# Patient Record
Sex: Female | Born: 1950 | Race: Black or African American | Hispanic: No | Smoking: Former smoker
Health system: Southern US, Community
[De-identification: ages and names within clinical notes are randomized; demographics above are authoritative.]

## PROBLEM LIST (undated history)

## (undated) DIAGNOSIS — I10 Essential (primary) hypertension: Secondary | ICD-10-CM

## (undated) DIAGNOSIS — J45909 Unspecified asthma, uncomplicated: Secondary | ICD-10-CM

## (undated) DIAGNOSIS — R079 Chest pain, unspecified: Secondary | ICD-10-CM

## (undated) DIAGNOSIS — E119 Type 2 diabetes mellitus without complications: Secondary | ICD-10-CM

## (undated) DIAGNOSIS — J449 Chronic obstructive pulmonary disease, unspecified: Secondary | ICD-10-CM

## (undated) DIAGNOSIS — I509 Heart failure, unspecified: Secondary | ICD-10-CM

## (undated) DIAGNOSIS — Z950 Presence of cardiac pacemaker: Secondary | ICD-10-CM

## (undated) DIAGNOSIS — I442 Atrioventricular block, complete: Secondary | ICD-10-CM

## (undated) HISTORY — DX: Chest pain, unspecified: R07.9

## (undated) HISTORY — DX: Heart failure, unspecified: I50.9

## (undated) HISTORY — DX: Chronic obstructive pulmonary disease, unspecified: J44.9

## (undated) HISTORY — DX: Type 2 diabetes mellitus without complications: E11.9

## (undated) HISTORY — DX: Presence of cardiac pacemaker: Z95.0

## (undated) HISTORY — PX: TUBAL LIGATION: SHX77

## (undated) HISTORY — PX: APPENDECTOMY: SHX54

---
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
## 2000-05-11 ENCOUNTER — Encounter: Payer: Self-pay | Admitting: *Deleted

## 2000-05-11 ENCOUNTER — Emergency Department (HOSPITAL_COMMUNITY): Admission: EM | Admit: 2000-05-11 | Discharge: 2000-05-11 | Payer: Self-pay | Admitting: Emergency Medicine

## 2001-07-08 ENCOUNTER — Emergency Department (HOSPITAL_COMMUNITY): Admission: EM | Admit: 2001-07-08 | Discharge: 2001-07-08 | Payer: Self-pay

## 2008-07-22 ENCOUNTER — Emergency Department (HOSPITAL_COMMUNITY): Admission: EM | Admit: 2008-07-22 | Discharge: 2008-07-22 | Payer: Self-pay | Admitting: Family Medicine

## 2010-04-28 ENCOUNTER — Emergency Department (HOSPITAL_COMMUNITY): Admission: EM | Admit: 2010-04-28 | Discharge: 2010-04-28 | Payer: Self-pay | Admitting: Emergency Medicine

## 2010-09-19 LAB — POCT I-STAT, CHEM 8
BUN: 12 mg/dL (ref 6–23)
Calcium, Ion: 1.07 mmol/L — ABNORMAL LOW (ref 1.12–1.32)
Chloride: 104 mEq/L (ref 96–112)
Creatinine, Ser: 0.9 mg/dL (ref 0.4–1.2)
Glucose, Bld: 106 mg/dL — ABNORMAL HIGH (ref 70–99)
HCT: 52 % — ABNORMAL HIGH (ref 36.0–46.0)
Hemoglobin: 17.7 g/dL — ABNORMAL HIGH (ref 12.0–15.0)
Potassium: 4.3 mEq/L (ref 3.5–5.1)
Sodium: 136 mEq/L (ref 135–145)
TCO2: 27 mmol/L (ref 0–100)

## 2011-03-18 ENCOUNTER — Emergency Department (HOSPITAL_COMMUNITY)
Admission: EM | Admit: 2011-03-18 | Discharge: 2011-03-18 | Disposition: A | Discharge status: Home / Self Care | Payer: Self-pay | Attending: Emergency Medicine | Admitting: Emergency Medicine

## 2011-03-18 ENCOUNTER — Emergency Department (HOSPITAL_COMMUNITY): Payer: Self-pay

## 2011-03-18 DIAGNOSIS — Y92009 Unspecified place in unspecified non-institutional (private) residence as the place of occurrence of the external cause: Secondary | ICD-10-CM | POA: Insufficient documentation

## 2011-03-18 DIAGNOSIS — S82409A Unspecified fracture of shaft of unspecified fibula, initial encounter for closed fracture: Secondary | ICD-10-CM | POA: Insufficient documentation

## 2011-03-18 DIAGNOSIS — I10 Essential (primary) hypertension: Secondary | ICD-10-CM | POA: Insufficient documentation

## 2011-03-18 DIAGNOSIS — W010XXA Fall on same level from slipping, tripping and stumbling without subsequent striking against object, initial encounter: Secondary | ICD-10-CM | POA: Insufficient documentation

## 2011-03-18 DIAGNOSIS — R609 Edema, unspecified: Secondary | ICD-10-CM | POA: Insufficient documentation

## 2012-06-23 ENCOUNTER — Encounter (HOSPITAL_COMMUNITY): Payer: Self-pay | Admitting: *Deleted

## 2012-06-23 ENCOUNTER — Emergency Department (HOSPITAL_COMMUNITY): Payer: Medicaid Other

## 2012-06-23 ENCOUNTER — Inpatient Hospital Stay (HOSPITAL_COMMUNITY)
Admission: EM | Admit: 2012-06-23 | Discharge: 2012-06-28 | DRG: 244 | Disposition: A | Discharge status: Home / Self Care | Payer: Medicaid Other | Attending: Cardiology | Admitting: Cardiology

## 2012-06-23 DIAGNOSIS — J449 Chronic obstructive pulmonary disease, unspecified: Secondary | ICD-10-CM | POA: Diagnosis present

## 2012-06-23 DIAGNOSIS — S92819A Other fracture of unspecified foot, initial encounter for closed fracture: Secondary | ICD-10-CM

## 2012-06-23 DIAGNOSIS — S92909A Unspecified fracture of unspecified foot, initial encounter for closed fracture: Secondary | ICD-10-CM

## 2012-06-23 DIAGNOSIS — I442 Atrioventricular block, complete: Principal | ICD-10-CM

## 2012-06-23 DIAGNOSIS — I160 Hypertensive urgency: Secondary | ICD-10-CM

## 2012-06-23 DIAGNOSIS — I272 Pulmonary hypertension, unspecified: Secondary | ICD-10-CM

## 2012-06-23 DIAGNOSIS — F17200 Nicotine dependence, unspecified, uncomplicated: Secondary | ICD-10-CM | POA: Diagnosis present

## 2012-06-23 DIAGNOSIS — J4489 Other specified chronic obstructive pulmonary disease: Secondary | ICD-10-CM | POA: Diagnosis present

## 2012-06-23 DIAGNOSIS — W010XXA Fall on same level from slipping, tripping and stumbling without subsequent striking against object, initial encounter: Secondary | ICD-10-CM | POA: Diagnosis present

## 2012-06-23 DIAGNOSIS — J45909 Unspecified asthma, uncomplicated: Secondary | ICD-10-CM

## 2012-06-23 DIAGNOSIS — Z91199 Patient's noncompliance with other medical treatment and regimen due to unspecified reason: Secondary | ICD-10-CM

## 2012-06-23 DIAGNOSIS — I2789 Other specified pulmonary heart diseases: Secondary | ICD-10-CM | POA: Diagnosis present

## 2012-06-23 DIAGNOSIS — S92919A Unspecified fracture of unspecified toe(s), initial encounter for closed fracture: Secondary | ICD-10-CM | POA: Diagnosis present

## 2012-06-23 DIAGNOSIS — I10 Essential (primary) hypertension: Secondary | ICD-10-CM

## 2012-06-23 DIAGNOSIS — Z9119 Patient's noncompliance with other medical treatment and regimen: Secondary | ICD-10-CM

## 2012-06-23 DIAGNOSIS — W108XXA Fall (on) (from) other stairs and steps, initial encounter: Secondary | ICD-10-CM

## 2012-06-23 HISTORY — DX: Unspecified asthma, uncomplicated: J45.909

## 2012-06-23 HISTORY — DX: Essential (primary) hypertension: I10

## 2012-06-23 HISTORY — DX: Atrioventricular block, complete: I44.2

## 2012-06-23 LAB — POCT I-STAT, CHEM 8
BUN: 11 mg/dL (ref 6–23)
Calcium, Ion: 1.22 mmol/L (ref 1.13–1.30)
Chloride: 104 mEq/L (ref 96–112)
Creatinine, Ser: 0.9 mg/dL (ref 0.50–1.10)
Glucose, Bld: 95 mg/dL (ref 70–99)
HCT: 51 % — ABNORMAL HIGH (ref 36.0–46.0)
Hemoglobin: 17.3 g/dL — ABNORMAL HIGH (ref 12.0–15.0)
Potassium: 4.3 mEq/L (ref 3.5–5.1)
Sodium: 142 mEq/L (ref 135–145)
TCO2: 28 mmol/L (ref 0–100)

## 2012-06-23 LAB — MAGNESIUM: Magnesium: 2 mg/dL (ref 1.5–2.5)

## 2012-06-23 LAB — POCT I-STAT TROPONIN I: Troponin i, poc: 0.03 ng/mL (ref 0.00–0.08)

## 2012-06-23 LAB — PROTIME-INR
INR: 1.04 (ref 0.00–1.49)
Prothrombin Time: 13.5 seconds (ref 11.6–15.2)

## 2012-06-23 LAB — TROPONIN I
Troponin I: <0.3 ng/mL (ref <0.30)
Troponin I: <0.3 ng/mL (ref <0.30)

## 2012-06-23 MED ORDER — CHLORHEXIDINE GLUCONATE 4 % EX LIQD
60.0000 mL | Freq: Once | CUTANEOUS | Status: AC
  Administered 2012-06-23: 4 via TOPICAL
  Filled 2012-06-23: qty 60

## 2012-06-23 MED ORDER — NITROGLYCERIN IN D5W 200-5 MCG/ML-% IV SOLN
5.0000 ug/min | INTRAVENOUS | Status: DC
  Administered 2012-06-23: 100 ug/min via INTRAVENOUS
  Administered 2012-06-23: 5 ug/min via INTRAVENOUS
  Administered 2012-06-24: 70 ug/min via INTRAVENOUS
  Administered 2012-06-24: 160 ug/min via INTRAVENOUS
  Administered 2012-06-24: 125 ug/min via INTRAVENOUS
  Administered 2012-06-26: 15 ug/min via INTRAVENOUS
  Filled 2012-06-23 (×6): qty 250

## 2012-06-23 MED ORDER — SODIUM CHLORIDE 0.45 % IV SOLN
INTRAVENOUS | Status: DC
  Administered 2012-06-23: 50 mL/h via INTRAVENOUS

## 2012-06-23 MED ORDER — CHLORHEXIDINE GLUCONATE 4 % EX LIQD
60.0000 mL | Freq: Once | CUTANEOUS | Status: AC
  Administered 2012-06-24: 4 via TOPICAL
  Filled 2012-06-23 (×2): qty 60

## 2012-06-23 MED ORDER — ACETAMINOPHEN 325 MG PO TABS: 650.0000 mg | ORAL_TABLET | ORAL | Status: DC | PRN

## 2012-06-23 MED ORDER — HYDROCODONE-ACETAMINOPHEN 5-325 MG PO TABS
1.0000 | ORAL_TABLET | ORAL | Status: DC | PRN
  Administered 2012-06-23 (×2): 1 via ORAL
  Administered 2012-06-24 – 2012-06-27 (×13): 2 via ORAL
  Administered 2012-06-27: 1 via ORAL
  Administered 2012-06-27 – 2012-06-28 (×2): 2 via ORAL
  Filled 2012-06-23 (×2): qty 2
  Filled 2012-06-23: qty 1
  Filled 2012-06-23 (×6): qty 2
  Filled 2012-06-23: qty 1
  Filled 2012-06-23 (×4): qty 2
  Filled 2012-06-23: qty 1
  Filled 2012-06-23 (×3): qty 2

## 2012-06-23 MED ORDER — SODIUM CHLORIDE 0.9 % IJ SOLN
3.0000 mL | Freq: Two times a day (BID) | INTRAMUSCULAR | Status: DC
  Administered 2012-06-23 – 2012-06-27 (×5): 3 mL via INTRAVENOUS

## 2012-06-23 MED ORDER — SODIUM CHLORIDE 0.9 % IJ SOLN
3.0000 mL | Freq: Two times a day (BID) | INTRAMUSCULAR | Status: DC
  Administered 2012-06-23 – 2012-06-24 (×2): 3 mL via INTRAVENOUS

## 2012-06-23 MED ORDER — LISINOPRIL 20 MG PO TABS
20.0000 mg | ORAL_TABLET | Freq: Every day | ORAL | Status: DC
  Administered 2012-06-23 – 2012-06-24 (×2): 20 mg via ORAL
  Filled 2012-06-23 (×3): qty 1

## 2012-06-23 MED ORDER — SODIUM CHLORIDE 0.9 % IR SOLN
80.0000 mg | Status: DC
  Filled 2012-06-23: qty 2

## 2012-06-23 MED ORDER — SODIUM CHLORIDE 0.9 % IJ SOLN: 3.0000 mL | INTRAMUSCULAR | Status: DC | PRN

## 2012-06-23 MED ORDER — SODIUM CHLORIDE 0.9 % IV SOLN
250.0000 mL | INTRAVENOUS | Status: DC | PRN
  Administered 2012-06-24: 250 mL via INTRAVENOUS

## 2012-06-23 MED ORDER — SODIUM CHLORIDE 0.9 % IV SOLN: 250.0000 mL | INTRAVENOUS | Status: DC

## 2012-06-23 MED ORDER — NITROGLYCERIN 0.4 MG SL SUBL: 0.4000 mg | SUBLINGUAL_TABLET | SUBLINGUAL | Status: DC | PRN

## 2012-06-23 MED ORDER — CEFAZOLIN SODIUM-DEXTROSE 2-3 GM-% IV SOLR
2.0000 g | INTRAVENOUS | Status: DC
  Filled 2012-06-23: qty 50

## 2012-06-23 MED ORDER — ONDANSETRON HCL 4 MG/2ML IJ SOLN: 4.0000 mg | Freq: Four times a day (QID) | INTRAMUSCULAR | Status: DC | PRN

## 2012-06-23 MED ORDER — ALBUTEROL SULFATE HFA 108 (90 BASE) MCG/ACT IN AERS
2.0000 | INHALATION_SPRAY | Freq: Four times a day (QID) | RESPIRATORY_TRACT | Status: DC | PRN
  Filled 2012-06-23: qty 6.7

## 2012-06-23 NOTE — H&P (Signed)
History and Physical  Patient ID: Erika Leach MRN: [id number], DOB: 29-Nov-1950 Date of Encounter: 06/23/2012, 3:43 PM Primary Physician: does not see a PCP Primary Cardiologist: New to [place]  Chief Complaint: foot pain Reason for Consult: complete heart block  HPI: Erika Leach is a 61 y/o F with h/o HTN, asthma, 40 yrs tobacco & no prior cardiac history who has not seen a doctor in several years. She presented to [facility] complaining of foot pain from a mechanical fall a few days back and was found to be in complete heart block and hypertensive. On Friday 12/13, she was walking up the steps with groceries in hand and lost her footing. She fell on her R side, striking her shoulder. This has ached over the weekend and she has been unable to raise up her R arm over her head. She also believes she injured her foot as she woke up with R foot pain near the ball of her foot. No swelling or deformity but it is tender to the touch. On arrival to the ER, she was found to have a HR 35-50 in complete heart block and BP as high as 242/87. In retrospect, she does report that she has had worsening DOE for several months now. She reports easy fatiguing with even minimal activity. She has had occasional "pings" of chest pain but no sustained episodes beyond a second or two. No exertional chest pain. No LEE, orthopnea, PND, nausea, vomiting, near-syncope or syncope. She continues to have foot/shoulder pain.  Past Medical History  Diagnosis Date  . Asthma   . HTN (hypertension)     Dx in past      Most Recent Cardiac Studies: None   Surgical History: History reviewed. No pertinent past surgical history.   Home Meds: occasional OTC cough syrup  Allergies: No Known Allergies  History   Social History  . Marital Status: Married    Spouse Name: N/A    Number of Children: N/A  . Years of Education: N/A   Occupational History  . Not on file.   Social History Main Topics  . Smoking status: Current Every  Day Smoker -- 40 years  . Smokeless tobacco: Never Used  . Alcohol Use: Yes     Comment: rare - special occasions  . Drug Use: No  . Sexually Active: Not on file   Other Topics Concern  . Not on file   Social History Narrative  . No narrative on file     Family History  Problem Relation Age of Onset  . Heart attack Mother     Died age 92  . Pneumonia Father   . Breast cancer Sister   . Heart attack Brother     3 MI's - 1st age 56    Review of Systems: General: negative for chills, fever, night sweats. Weight gain of 20 lbs steadily after 1 yr of eating poorly.  Cardiovascular: negative for chest pain, edema, orthopnea, palpitations, paroxysmal nocturnal dyspnea. For positives see above Dermatological: negative for rash Respiratory: chronic cough, occasionally wakes her up Urologic: negative for hematuria Abdominal: negative for nausea, vomiting, diarrhea, bright red blood per rectum, melena, or hematemesis Neurologic: negative for visual changes, syncope, or dizziness All other systems reviewed and are otherwise negative except as noted above.  Labs:   Lab Results  Component Value Date   HGB 17.3* 06/23/2012   HCT 51.0* 06/23/2012    Lab 06/23/12 1456  NA 142  K 4.3  CL 104  CO2 --  BUN 11  CREATININE 0.90  CALCIUM --  PROT --  BILITOT --  ALKPHOS --  ALT --  AST --  GLUCOSE 95    Radiology/Studies:  Dg Chest 2 View 06/23/2012  *RADIOLOGY REPORT*  Clinical Data: Twisted foot, heart block  CHEST - 2 VIEW  Comparison: 04/28/2010  Findings: Cardiomediastinal silhouette is stable.  No acute infiltrate or pleural effusion.  No pulmonary edema.  Bony thorax is unremarkable.  IMPRESSION: No active disease.  No significant change.   Original Report Authenticated By: Natasha Mead, M.D.    Dg Foot Complete Right  06/23/2012  *RADIOLOGY REPORT*  Clinical Data: Foot injury  RIGHT FOOT COMPLETE - 3+ VIEW  Comparison: None.  Findings: Three views of the right foot  submitted.  There is a lucent line in the medial sesamoid adjacent to distal aspect first metatarsal.  Sesamoid bone fracture cannot be excluded.  Clinical correlation is necessary.  IMPRESSION: There is a lucent line in the medial sesamoid adjacent to distal aspect first metatarsal.  Sesamoid bone fracture cannot be excluded.  Clinical correlation is necessary.   Original Report Authenticated By: Natasha Mead, M.D.    Shoulder film - we ordered. Pending.   EKG: complete heart block 39bpm with RBBB, LPFB morphology, slight ST depression III, avF, no prior to compare to  Physical Exam: Blood pressure 221/90, pulse 46, temperature 98.1 F (36.7 C), temperature source Oral, resp. rate 21, SpO2 99.00%. General: Well developed, well nourished AAF in no acute distress. Head: Normocephalic, atraumatic, sclera somewhat icteric, no xanthomas, nares are without discharge. Neck: Negative for carotid bruits. JVD not elevated. Mild thyromegaly. Lungs: Diminished BS throughout with diffuse expiratory wheezing, coarse breath sounds. Breathing is unlabored. Heart: regular rhythm but markedly bradycardic with S1 S2. No murmurs, rubs, or gallops appreciated. Abdomen: Soft, non-tender, non-distended with normoactive bowel sounds. No hepatomegaly. No rebound/guarding. No obvious abdominal masses. Msk:  Strength and tone appear normal for age. Extremities: No clubbing or cyanosis. No edema.  Distal pedal pulses are 2+ and equal bilaterally. R great toe area is tender to palpation. Guarding of R shoulder noted. Neuro: Alert and oriented X 3. No focal deficit. No facial asymmetry. Moves all extremities spontaneously. Psych:  Responds to questions appropriately with a normal affect.    ASSESSMENT AND PLAN:   1. Complete heart block - Uncertain chronicity but she reports months of DOE and easy fatiguing. Not on any AV nodal blocking agents. Check TFTs. She is currently hemodynamically stable. Plan transfer to [facility]  ICU with pacemaker planned in AM. Dr. Antoine Poche discussed with Dr. Graciela Husbands - at this time, will hold off on cath as we do not feel this is an ischemic rhythm. Check echo. 2. Tobacco abuse - counseled regarding cessation. Will rx albuterol inhaler for wheezing.  3. R foot and R shoulder pain s/p mechanical fall - pain control. Ortho consult regarding abnormal film ?fracture and further management. 4. Hypertensive urgency - suspect chronic HTN with component of pain accelerating further. Pain management. Initiate careful BP lowering - will add IV NTG to titrate and start ACEI.  Signed, Dayna Dunn PA-C 06/23/2012, 3:43 PM  History and all data above reviewed.  Patient examined.  I agree with the findings as above. The patient presents after a fall area she had trauma to her right shoulder and foot as described above. She did not have a syncopal episode. She has had no prior cardiac history. She does have a long-standing smoking history  he doesn't see doctors. Looks like she has had uncontrolled hypertension. She has had some slowly progressive dyspnea over about 8 months. Will she describes shortness of breath walking up the stairs. She's not been PND or orthopnea. She does not have any chest pain. Upon presentation to the emergency room and to followup her foot pain she was noted to be in complete heart block. The patient exam reveals [id number]  ,  Lungs: Decrea breath sounds, no wheezing or crackles  ,  Abd: Positive bowel sounds, no rebound no guarding, Ext No edema  .  All available labs, radiology testing, previous records reviewed. Agree with documented assessment and plan. CHB: We will cycle cardiac enzymes. The patient will need a pacemaker after we have excluded any acute coronary event.  She will have an echocardiogram.  Tobacco:  She has been educated.   Fayrene Fearing Mannie Wineland  5:03 PM  06/23/2012

## 2012-06-23 NOTE — Consult Note (Signed)
No primary provider on file. Chief Complaint: Right foot and shoulder pain History: Erika Leach is a 61 y/o F with h/o HTN, asthma, 40 yrs tobacco & no prior cardiac history who has not seen a doctor in several years. She presented to [facility] complaining of foot pain from a mechanical fall a few days back and was found to be in complete heart block and hypertensive. On Friday 12/13, she was walking up the steps with groceries in hand and lost her footing. She fell on her R side, striking her shoulder. This has ached over the weekend and she has been unable to raise up her R arm over her head. She also believes she injured her foot as she woke up with R foot pain near the ball of her foot. No swelling or deformity but it is tender to the touch. On arrival to the ER, she was found to have a HR 35-50 in complete heart block and BP as high as 242/87. In retrospect, she does report that she has had worsening DOE for several months now. She reports easy fatiguing with even minimal activity. She has had occasional "pings" of chest pain but no sustained episodes beyond a second or two. No exertional chest pain. No LEE, orthopnea, PND, nausea, vomiting, near-syncope or syncope. She continues to have foot/shoulder pain.  Past Medical History  Diagnosis Date  . Asthma   . HTN (hypertension)     Dx in past     No Known Allergies  No current facility-administered medications on file prior to encounter.   No current outpatient prescriptions on file prior to encounter.    Physical Exam: Filed Vitals:   06/23/12 1530  BP: 225/85  Pulse: 37  Temp:   Resp: 21   A+O X3 NVI Compartments soft/NT Pain with palpation of right foot (sesamoid) No hip/knee/ankle pain   Image: Dg Chest 2 View  06/23/2012  *RADIOLOGY REPORT*  Clinical Data: Twisted foot, heart block  CHEST - 2 VIEW  Comparison: 04/28/2010  Findings: Cardiomediastinal silhouette is stable.  No acute infiltrate or pleural effusion.  No pulmonary  edema.  Bony thorax is unremarkable.  IMPRESSION: No active disease.  No significant change.   Original Report Authenticated By: Natasha Mead, M.D.    Dg Shoulder Right Port  06/23/2012  *RADIOLOGY REPORT*  Clinical Data: Status post fall.  PORTABLE RIGHT SHOULDER - 2+ VIEW  Comparison: None.  Findings: The humerus is located and the acromioclavicular joint is intact.  There is no fracture.  Mild to moderate acromioclavicular degenerative change is noted.  Imaged right lung and ribs are clear.  IMPRESSION: No acute finding.  Acromioclavicular degenerative disease.   Original Report Authenticated By: Holley Dexter, M.D.    Dg Foot Complete Right  06/23/2012  *RADIOLOGY REPORT*  Clinical Data: Foot injury  RIGHT FOOT COMPLETE - 3+ VIEW  Comparison: None.  Findings: Three views of the right foot submitted.  There is a lucent line in the medial sesamoid adjacent to distal aspect first metatarsal.  Sesamoid bone fracture cannot be excluded.  Clinical correlation is necessary.  IMPRESSION: There is a lucent line in the medial sesamoid adjacent to distal aspect first metatarsal.  Sesamoid bone fracture cannot be excluded.  Clinical correlation is necessary.   Original Report Authenticated By: Natasha Mead, M.D.     A/P:  Patient with right medial sesamoid fracture.  S/p fall with wt bearing pain.   No need for acute surgical intervention Agree with ER plan of  CAM walker and assisted ambulation Patient will require admission for pacemaker 2nd to heart block.   Recommend WBAT in CAM boot with platform walker since she will not be able to use crutches after pacemaker placement F/U in 1 week for re-evaluation. No acute shoulder injury - no evidence of fracture.  Treat symptomatically.

## 2012-06-23 NOTE — ED Notes (Signed)
Per PA pt allowed to have heart healthy diet. sandwhich given to pt

## 2012-06-23 NOTE — ED Notes (Signed)
Crash cart at bedside. Pads on pt

## 2012-06-23 NOTE — ED Notes (Signed)
Cardiology at bedside.

## 2012-06-23 NOTE — ED Notes (Signed)
Gave EKG to Dr Radford Pax

## 2012-06-23 NOTE — ED Notes (Signed)
Pt states she fell going up the stairs on Friday, states having R foot pain, states woke her up in the middle of the night, no deformity noted, states top of foot is where pain is, denies LOC or hitting head.

## 2012-06-23 NOTE — ED Provider Notes (Signed)
History     CSN: [id number]  Arrival date & time 06/23/12  1331   First MD Initiated Contact with Patient 06/23/12 1432      Chief Complaint  Patient presents with  . Foot Injury    right     HPI Pt states she fell going up the stairs on Friday, states having R foot pain, states woke her up in the middle of the night, no deformity noted, states top of foot is where pain is, denies LOC or hitting head.  Patient denies any chest pain but has had some increase shortness of breath.  She is a smoker.  She has no known heart history.  She's had no history of syncope. Past Medical History  Diagnosis Date  . Asthma   . HTN (hypertension)     poorly controlled  . Complete heart block 06/24/2012    Past Surgical History  Procedure Date  . Tubal ligation     Family History  Problem Relation Age of Onset  . Heart attack Mother     Died age 38  . Pneumonia Father   . Breast cancer Sister   . Heart attack Brother     3 MI's - 1st age 65    History  Substance Use Topics  . Smoking status: Current Every Day Smoker -- 1.0 packs/day for 40 years    Types: Cigarettes  . Smokeless tobacco: Never Used  . Alcohol Use: Yes     Comment: rare - special occasions    OB History    Grav Para Term Preterm Abortions TAB SAB Ect Mult Living                  Review of Systems All other systems reviewed and are negative Allergies  Review of patient's allergies indicates no known allergies.  Home Medications   No current outpatient prescriptions on file.  BP 160/84  Pulse 68  Temp 99 F (37.2 C) (Oral)  Resp 20  Ht 5\' 6"  (1.676 m)  Wt 189 lb 13.1 oz (86.1 kg)  BMI 30.64 kg/m2  SpO2 93%  Physical Exam  Nursing note and vitals reviewed. Constitutional: She is oriented to person, place, and time. She appears well-developed and well-nourished. No distress.  HENT:  Head: Normocephalic and atraumatic.  Eyes: Pupils are equal, round, and reactive to light.  Neck: Normal range  of motion.  Cardiovascular: Intact distal pulses.  Bradycardia present.   Pulmonary/Chest: No respiratory distress.  Abdominal: Normal appearance. She exhibits no distension.  Musculoskeletal: Normal range of motion.       Right foot: She exhibits tenderness (As indicated).       Feet:  Neurological: She is alert and oriented to person, place, and time. No cranial nerve deficit.  Skin: Skin is warm and dry. No rash noted.  Psychiatric: She has a normal mood and affect. Her behavior is normal.    ED Course  Procedures (including critical care time)  Date: 06/23/2012  Rate: 39  Rhythm: Third-degree heart block  QRS Axis: Right bundle branch block  Intervals: Abnormal  ST/T Wave abnormalities: ST depression in inferior leads  Conduction Disutrbances: none  Narrative Interpretation: Significant change from previous tracing in 2011   CRITICAL CARE Performed by: Nelva Nay L   Total critical care time: 30 min  Critical care time was exclusive of separately billable procedures and treating other patients.  Critical care was necessary to treat or prevent imminent or life-threatening deterioration.  Critical care was  time spent personally by me on the following activities: development of treatment plan with patient and/or surrogate as well as nursing, discussions with consultants, evaluation of patient's response to treatment, examination of patient, obtaining history from patient or surrogate, ordering and performing treatments and interventions, ordering and review of laboratory studies, ordering and review of radiographic studies, pulse oximetry and re-evaluation of patient's condition.   Labs Reviewed  POCT I-STAT, CHEM 8 - Abnormal; Notable for the following:    Hemoglobin 17.3 (*)     HCT 51.0 (*)     All other components within normal limits  BASIC METABOLIC PANEL - Abnormal; Notable for the following:    Glucose, Bld 145 (*)     GFR calc non Af Amer 75 (*)     GFR calc  Af Amer 86 (*)     All other components within normal limits  POCT I-STAT TROPONIN I  TROPONIN I  MAGNESIUM  TSH  T4, FREE  PROTIME-INR  MRSA PCR SCREENING  CBC  TROPONIN I  LIPID PANEL  LAB REPORT - SCANNED   Dg Chest Portable 1 View  06/25/2012  *RADIOLOGY REPORT*  Clinical Data: Pacemaker placement.  PORTABLE CHEST - 1 VIEW  Comparison: 06/23/2012 and 04/28/2010.  Findings: 0559 hours.  Interval placement of a left subclavian pacemaker with leads in the right atrium and right ventricle.  The heart size and mediastinal contours are stable.  The lungs are clear.  There is no pleural effusion or pneumothorax.  Telemetry leads overlie the chest.  IMPRESSION: Satisfactorily positioned pacemaker leads as described.  No demonstrated complication.   Original Report Authenticated By: Carey Bullocks, M.D.      1. Third degree heart block   2. Foot fracture   3. Complete heart block   4. HTN (hypertension)       MDM  Cardiology consulted and will come see the patient emergency room        Nelia Shi, MD 06/25/12 2242

## 2012-06-24 ENCOUNTER — Encounter (HOSPITAL_COMMUNITY)
Admission: EM | Disposition: A | Discharge status: Home / Self Care | Payer: Self-pay | Source: Home / Self Care | Attending: Cardiology

## 2012-06-24 ENCOUNTER — Encounter (HOSPITAL_COMMUNITY): Payer: Self-pay | Admitting: Internal Medicine

## 2012-06-24 DIAGNOSIS — I442 Atrioventricular block, complete: Secondary | ICD-10-CM

## 2012-06-24 HISTORY — PX: PERMANENT PACEMAKER INSERTION: SHX5480

## 2012-06-24 HISTORY — DX: Atrioventricular block, complete: I44.2

## 2012-06-24 LAB — BASIC METABOLIC PANEL
BUN: 15 mg/dL (ref 6–23)
CO2: 26 mEq/L (ref 19–32)
Calcium: 9.5 mg/dL (ref 8.4–10.5)
Chloride: 101 mEq/L (ref 96–112)
Creatinine, Ser: 0.83 mg/dL (ref 0.50–1.10)
GFR calc Af Amer: 86 mL/min — ABNORMAL LOW (ref >90)
GFR calc non Af Amer: 75 mL/min — ABNORMAL LOW (ref >90)
Glucose, Bld: 145 mg/dL — ABNORMAL HIGH (ref 70–99)
Potassium: 4 mEq/L (ref 3.5–5.1)
Sodium: 138 mEq/L (ref 135–145)

## 2012-06-24 LAB — CBC
HCT: 40.8 % (ref 36.0–46.0)
Hemoglobin: 13.6 g/dL (ref 12.0–15.0)
MCH: 28.7 pg (ref 26.0–34.0)
MCHC: 33.3 g/dL (ref 30.0–36.0)
MCV: 86.1 fL (ref 78.0–100.0)
Platelets: 209 10*3/uL (ref 150–400)
RBC: 4.74 MIL/uL (ref 3.87–5.11)
RDW: 13.3 % (ref 11.5–15.5)
WBC: 7.8 10*3/uL (ref 4.0–10.5)

## 2012-06-24 LAB — MRSA PCR SCREENING: MRSA by PCR: NEGATIVE

## 2012-06-24 LAB — T4, FREE: Free T4: 1.2 ng/dL (ref 0.80–1.80)

## 2012-06-24 LAB — LIPID PANEL
Cholesterol: 169 mg/dL (ref 0–200)
HDL: 43 mg/dL (ref >39)
LDL Cholesterol: 96 mg/dL (ref 0–99)
Total CHOL/HDL Ratio: 3.9 RATIO
Triglycerides: 149 mg/dL (ref <150)
VLDL: 30 mg/dL (ref 0–40)

## 2012-06-24 LAB — TSH: TSH: 1.593 u[IU]/mL (ref 0.350–4.500)

## 2012-06-24 SURGERY — PERMANENT PACEMAKER INSERTION
Anesthesia: LOCAL

## 2012-06-24 MED ORDER — LIDOCAINE HCL (PF) 1 % IJ SOLN
INTRAMUSCULAR | Status: AC
  Filled 2012-06-24: qty 60

## 2012-06-24 MED ORDER — MIDAZOLAM HCL 2 MG/2ML IJ SOLN
INTRAMUSCULAR | Status: AC
  Filled 2012-06-24: qty 2

## 2012-06-24 MED ORDER — SODIUM CHLORIDE 0.9 % IV SOLN
INTRAVENOUS | Status: DC
  Administered 2012-06-24: 21:00:00 via INTRAVENOUS

## 2012-06-24 MED ORDER — CEFAZOLIN SODIUM 1-5 GM-% IV SOLN
1.0000 g | Freq: Four times a day (QID) | INTRAVENOUS | Status: AC
  Administered 2012-06-24 – 2012-06-25 (×2): 1 g via INTRAVENOUS
  Filled 2012-06-24 (×2): qty 50

## 2012-06-24 MED ORDER — ONDANSETRON HCL 4 MG/2ML IJ SOLN: 4.0000 mg | Freq: Four times a day (QID) | INTRAMUSCULAR | Status: DC | PRN

## 2012-06-24 MED ORDER — LABETALOL HCL 200 MG PO TABS
200.0000 mg | ORAL_TABLET | Freq: Two times a day (BID) | ORAL | Status: DC
  Administered 2012-06-24 – 2012-06-25 (×3): 200 mg via ORAL
  Filled 2012-06-24 (×5): qty 1

## 2012-06-24 MED ORDER — ACETAMINOPHEN 325 MG PO TABS: 325.0000 mg | ORAL_TABLET | ORAL | Status: DC | PRN

## 2012-06-24 MED ORDER — FENTANYL CITRATE 0.05 MG/ML IJ SOLN
INTRAMUSCULAR | Status: AC
  Filled 2012-06-24: qty 2

## 2012-06-24 MED ORDER — CEFAZOLIN SODIUM 1-5 GM-% IV SOLN
1.0000 g | Freq: Four times a day (QID) | INTRAVENOUS | Status: DC
  Filled 2012-06-24 (×3): qty 50

## 2012-06-24 MED ORDER — SODIUM CHLORIDE 0.9 % IV SOLN: INTRAVENOUS | Status: AC

## 2012-06-24 MED ORDER — HEPARIN (PORCINE) IN NACL 2-0.9 UNIT/ML-% IJ SOLN
INTRAMUSCULAR | Status: AC
  Filled 2012-06-24: qty 500

## 2012-06-24 NOTE — Progress Notes (Signed)
Pt will need outpt sleep study and THALLIUM stress.    Also look at CXR to exclude sarccoid  If device dependent in am should keep in house until Friday

## 2012-06-24 NOTE — Progress Notes (Signed)
  Patient Name: Erika Leach      SUBJECTIVE: admitted with weakness following a fall and found to have CHB  TSH nl; CEz - X2  Echo p  Blood pressure poorly controlled  Past Medical History  Diagnosis Date  . Asthma   . HTN (hypertension)     poorly controlled  . Complete heart block 06/24/2012    PHYSICAL EXAM Filed Vitals:   06/24/12 1030 06/24/12 1045 06/24/12 1100 06/24/12 1130  BP: 154/54 160/56 170/61 162/57  Pulse: 35 34 34 34  Temp:      TempSrc:      Resp: 16 20 17 21   Height:      Weight:      SpO2: 97% 95% 97% 96%    Well developed and nourished in no acute distress HENT normal Neck supple with JVP-flat Clear Slow but Regular rate and rhythm,2/6 systolic mAbd-soft with active BS No Clubbing cyanosis edema Skin-warm and dry A & Oriented  Grossly normal sensory and motor function  TELEMETRY: Reviewed telemetry pt in CHB ECCG  Sinus with CHB and RBBB wescape   Intake/Output Summary (Last 24 hours) at 06/24/12 1207 Last data filed at 06/24/12 1100  Gross per 24 hour  Intake 1492.11 ml  Output    450 ml  Net 1042.11 ml    LABS: Basic Metabolic Panel:  Lab 06/24/12 1610 06/23/12 1705 06/23/12 1456  NA 138 -- 142  K 4.0 -- 4.3  CL 101 -- 104  CO2 26 -- --  GLUCOSE 145* -- 95  BUN 15 -- 11  CREATININE 0.83 -- 0.90  CALCIUM 9.5 -- --  MG -- 2.0 --  PHOS -- -- --   Cardiac Enzymes:  Basename 06/23/12 2235 06/23/12 1705  CKTOTAL -- --  CKMB -- --  CKMBINDEX -- --  TROPONINI <0.30 <0.30   CBC:  Lab 06/24/12 0500 06/23/12 1456  WBC 7.8 --  NEUTROABS -- --  HGB 13.6 17.3*  HCT 40.8 51.0*  MCV 86.1 --  PLT 209 --   PROTIME:  Basename 06/23/12 1705  LABPROT 13.5  INR 1.04    Fasting Lipid Panel:  Basename 06/24/12 0500  CHOL 169  HDL 43  LDLCALC 96  TRIG 149  CHOLHDL 3.9  LDLDIRECT --   Thyroid Function Tests:  Basename 06/23/12 1705  TSH 1.593  T4TOTAL --  T3FREE --  THYROIDAB --   ECHO>> normlal LV function  withLVH  ASSESSMENT AND PLAN:  Patient Active Hospital Problem List: Complete heart block (06/24/2012) Hypertension   Poorly controlled bp  On IV NTG  For pacer today   The benefits and risks were reviewed including but not limited to death,  perforation, infection, lead dislodgement and device malfunction.  The patient understands agrees and is willing to proceed.     Signed, Sherryl Manges MD  06/24/2012

## 2012-06-24 NOTE — Progress Notes (Signed)
Dr. Armanda Magic was called r/t inability to control pt's BP within order parameters. Pt is on the max of Nitroglycerin at 200 mcg and her blood pressure remains above 160. Dr. Mayford Knife stated this was fine and to keep her on the current amount of Nitro. She also stated to call her back if her BP returns to the 200's systolic.

## 2012-06-24 NOTE — Plan of Care (Signed)
Problem: Consults Goal: Permanent Pacemaker Patient Education (See Patient Education module for education specifics.)  Outcome: Completed/Met Date Met:  06/24/12 Pt has spoken with MD, pt has viewed educational video

## 2012-06-24 NOTE — Progress Notes (Signed)
Orthopedic Tech Progress Note Patient Details:  Erika Leach 1951/01/28 [id number]  Ortho Devices Type of Ortho Device: Arm sling Ortho Device/Splint Location: left arm Ortho Device/Splint Interventions: Ordered Pt was in cath lab and arm sling was left with nurse to apply  [name] 06/24/2012, 3:36 PM

## 2012-06-24 NOTE — Progress Notes (Signed)
Echocardiogram 2D Echocardiogram has been performed.  Erika Leach 06/24/2012, 9:19 AM

## 2012-06-24 NOTE — CV Procedure (Signed)
Preop DX::CHB Post op DX:: same  Procedure  dual pacemaker implantation  After routine prep and drape, lidocaine was infiltrated in the prepectoral subclavicular region on the left side an incision was made and carried down to later the prepectoral fascia using electrocautery and sharp dissection a pocket was formed similarly. Hemostasis was obtained.  After this, we turned our attention to gaining accessm to the extrathoracic,left subclavian vein. This was accomplished without difficulty and without the aspiration of air or puncture of the artery. 2 separate venipunctures were accomplished; guidewires were placed and retained and sequentially 7 French sheath through which were  passed a Medtronic 5076 ventricular lead serial number [id number] and an Medtronic  atrial lead serial number [id number] .  The ventricular lead was manipulated to the right ventricular apex with a bipolar R wave was 7.4, the pacing impedance was 975, the threshold was 1.2 @ 0.5 msec  Current at threshold was   1.3  Ma and the current of injury was  brisk.  The right atrial lead was manipulated to the right atrial appendage with a bipolar P-wave  2.5, the pacing impedance was 698, the threshold 1.7@ 0.5 msec   Current at threshold was 3.0  Ma and the current of injury was briosk.  The ventricular lead was marked with a tie prior to the insertion of the atrial lead. The leads were affixed to the prepectoral fascia and attached to a  Medtronic ada[ta L  pulse generator serial number [id number]  P-synchronous/ AV  pacing was seen.  Hemostasis was obtained. The pocket was copiously irrigated with antibiotic containing saline solution. The leads and the pulse generator were placed in the pocket and affixed to the prepectoral fascia. The wound was then closed in 2 layers in the normal fashion. The wound was washed dried and a benzoin Steri-Strip dressing was applied.  Needle  Count, sponge counts and instrument counts were correct  at the end of the procedure .   The patient tolerated the procedure without apparent complication.  Gerlene Burdock.D.

## 2012-06-25 ENCOUNTER — Inpatient Hospital Stay (HOSPITAL_COMMUNITY): Payer: Medicaid Other

## 2012-06-25 DIAGNOSIS — I10 Essential (primary) hypertension: Secondary | ICD-10-CM

## 2012-06-25 MED ORDER — LISINOPRIL 20 MG PO TABS
20.0000 mg | ORAL_TABLET | Freq: Two times a day (BID) | ORAL | Status: DC
  Administered 2012-06-25 – 2012-06-28 (×6): 20 mg via ORAL
  Filled 2012-06-25 (×7): qty 1

## 2012-06-25 MED ORDER — AMLODIPINE BESYLATE 5 MG PO TABS
5.0000 mg | ORAL_TABLET | Freq: Every day | ORAL | Status: DC
  Administered 2012-06-25: 5 mg via ORAL
  Filled 2012-06-25 (×2): qty 1

## 2012-06-25 NOTE — Progress Notes (Signed)
   SUBJECTIVE:   Mild incisional pain.  No SOB   PHYSICAL EXAM Filed Vitals:   06/25/12 0100 06/25/12 0203 06/25/12 0400 06/25/12 0600  BP: 126/68 146/88 168/91 131/68  Pulse: 75 83 69 74  Temp:   98.7 F (37.1 C)   TempSrc:   Oral   Resp: 15 15 17 15   Height:      Weight:      SpO2:   97%    General:  No distress Lungs:  Clear Heart:  RRR Chest:  Wound OK Abdomen:  Positive bowel sounds, no rebound no guarding Extremities:  No edema  LABS: Lab Results  Component Value Date   TROPONINI <0.30 06/23/2012   No results found for this or any previous visit (from the past 24 hour(s)).  Intake/Output Summary (Last 24 hours) at 06/25/12 0742 Last data filed at 06/25/12 0600  Gross per 24 hour  Intake 1852.4 ml  Output    750 ml  Net 1102.4 ml    EKG:   Pending (Pace interrogation reviewed.)  ASSESSMENT AND PLAN:  Complete heart block:  Status post permanent pacemaker.  CXR looks OK.  No evidence of sarcoid.  Echo OK with normal wall motion.  Mildly elevated PA pressures.  She is pacing 100% (97% AS VP.)  Watch in house for one more day.     HTN (hypertension):  I need to get her off of the IV NTG.  I will start Norvasc today and increase the ACE.  Continue beta blocker.     Fayrene Fearing [facility] 06/25/2012 7:42 AM

## 2012-06-26 MED ORDER — LABETALOL HCL 200 MG PO TABS
200.0000 mg | ORAL_TABLET | Freq: Three times a day (TID) | ORAL | Status: DC
  Administered 2012-06-26 – 2012-06-27 (×4): 200 mg via ORAL
  Filled 2012-06-26 (×6): qty 1

## 2012-06-26 MED ORDER — AMLODIPINE BESYLATE 10 MG PO TABS
10.0000 mg | ORAL_TABLET | Freq: Every day | ORAL | Status: DC
Start: 2012-06-26 — End: 2012-06-28
  Administered 2012-06-26 – 2012-06-28 (×3): 10 mg via ORAL
  Filled 2012-06-26 (×3): qty 1

## 2012-06-26 MED ORDER — HYDRALAZINE HCL 20 MG/ML IJ SOLN: 10.0000 mg | INTRAMUSCULAR | Status: DC | PRN

## 2012-06-26 NOTE — Care Management Note (Signed)
    Page 1 of 1   06/26/2012     3:15:26 PM   CARE MANAGEMENT NOTE 06/26/2012  Patient:  Erika Leach, Erika Leach   Account Number:  [id number]  Date Initiated:  06/24/2012  Documentation initiated by:  Alvira Philips Assessment:   61 yr-old female am with dx of CHB, HTN urgency; lives with spouse, inependent PTA     Action/Plan:   Anticipated DC Date:     Anticipated DC Plan:    In-house referral  Artist      DC Planning Services  CM consult      Choice offered to / List presented to:             Status of service:   Medicare Important Message given?   (If response is "NO", the following Medicare IM given date fields will be blank) Date Medicare IM given:   Date Additional Medicare IM given:    Discharge Disposition:  HOME/SELF CARE  Per UR Regulation:  Reviewed for med. necessity/level of care/duration of stay  If discussed at Long Length of Stay Meetings, dates discussed:    Comments:  12/20 1500 Erika Kamare Caspers rn,bsn spoke w pt and her husband. no ins at present. states she has applied for disability and would like to speak w [employer] called and spoke w [employer] office and req someone speak w pt. explained role of case manager if any hhc or eq needed. anxious to get home to see dog snoopie. good spirits.

## 2012-06-26 NOTE — Progress Notes (Signed)
    SUBJECTIVE:   Mild incisional pain.  No SOB.  She complains only of a headache.     PHYSICAL EXAM Filed Vitals:   06/26/12 0400 06/26/12 0500 06/26/12 0600 06/26/12 0700  BP: 137/71 150/85 169/85 172/97  Pulse: 65 66 63 63  Temp:      TempSrc:      Resp: 14 13 13 15   Height:      Weight:      SpO2: 97%      General:  No distress Lungs:  Clear Heart:  RRR Chest:  Wound OK Abdomen:  Positive bowel sounds, no rebound no guarding Extremities:  No edema  LABS: Lab Results  Component Value Date   TROPONINI <0.30 06/23/2012   No results found for this or any previous visit (from the past 24 hour(s)).  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  Hopefully home today.     HTN (hypertension):  I need to get her off of the IV NTG and I gave these instructions to nursing.  I will increase Norvasc today and increase the beta blocker and continue the current ACE.     Erika Leach 06/26/2012 7:31 AM

## 2012-06-27 MED ORDER — CLONIDINE HCL 0.1 MG PO TABS
0.1000 mg | ORAL_TABLET | Freq: Two times a day (BID) | ORAL | Status: DC
  Administered 2012-06-27 – 2012-06-28 (×3): 0.1 mg via ORAL
  Filled 2012-06-27 (×4): qty 1

## 2012-06-27 NOTE — Progress Notes (Signed)
    SUBJECTIVE:   Erika Leach is a 61 y/o F with h/o HTN, asthma/COPD.   S/p PPM on 12/18 for CHB and symptomatic brady. Has had severe HTN.  IV NTG weaned off. Norvasc and labetalol increased yesterday but SBP still in 170s.  Feels good. Wants to go home.    PHYSICAL EXAM Filed Vitals:   06/27/12 0200 06/27/12 0400 06/27/12 0600 06/27/12 0800  BP: 162/75 157/76 164/83 171/87  Pulse:  62    Temp:  98.3 F (36.8 C)  98.1 F (36.7 C)  TempSrc:  Oral  Oral  Resp: 16 16 14 18   Height:      Weight:      SpO2:  96%     General:  Sitting in chair No distress Neck. No JVD Lungs:  Clear Heart:  RRR Chest:  Wound OK Abdomen:  Positive bowel sounds, no rebound no guarding Extremities:  No edema, clubbing or cyanosis Neuro: nonfocal. A & O x 3. Affect pleasant.   LABS: Lab Results  Component Value Date   TROPONINI <0.30 06/23/2012   No results found for this or any previous visit (from the past 24 hour(s)).  Intake/Output Summary (Last 24 hours) at 06/27/12 1610 Last data filed at 06/27/12 0600  Gross per 24 hour  Intake  711.3 ml  Output   2200 ml  Net -1488.7 ml     ASSESSMENT AND PLAN:  Complete heart block:  Status post permanent pacemaker.  Hopefully home today.     HTN (hypertension), uncontrolled:  She has long h/o uncontrolled HTN. She has been non-compliant with her meds. Need to try and keep regimen simple. Will d/c labetalol. Start cloniidne. Can also consider spiro.   Transfer tele. Home when SBP < 160. Hopefully in am.   Outpat sleep study per Dr. Graciela Husbands.     Erika Leach 06/27/2012 9:28 AM

## 2012-06-28 DIAGNOSIS — W108XXA Fall (on) (from) other stairs and steps, initial encounter: Secondary | ICD-10-CM

## 2012-06-28 DIAGNOSIS — I10 Essential (primary) hypertension: Secondary | ICD-10-CM

## 2012-06-28 DIAGNOSIS — S92819A Other fracture of unspecified foot, initial encounter for closed fracture: Secondary | ICD-10-CM

## 2012-06-28 DIAGNOSIS — J45909 Unspecified asthma, uncomplicated: Secondary | ICD-10-CM

## 2012-06-28 DIAGNOSIS — I272 Pulmonary hypertension, unspecified: Secondary | ICD-10-CM

## 2012-06-28 DIAGNOSIS — I160 Hypertensive urgency: Secondary | ICD-10-CM

## 2012-06-28 DIAGNOSIS — Z9119 Patient's noncompliance with other medical treatment and regimen: Secondary | ICD-10-CM

## 2012-06-28 MED ORDER — LISINOPRIL 20 MG PO TABS: 20.0000 mg | ORAL_TABLET | Freq: Two times a day (BID) | ORAL | Status: DC

## 2012-06-28 MED ORDER — AMLODIPINE BESYLATE 10 MG PO TABS: 10.0000 mg | ORAL_TABLET | Freq: Every day | ORAL | Status: DC

## 2012-06-28 MED ORDER — HYDROCODONE-ACETAMINOPHEN 5-325 MG PO TABS: 1.0000 | ORAL_TABLET | ORAL | Status: DC | PRN

## 2012-06-28 MED ORDER — CLONIDINE HCL 0.1 MG PO TABS: 0.1000 mg | ORAL_TABLET | Freq: Two times a day (BID) | ORAL | Status: DC

## 2012-06-28 NOTE — Progress Notes (Signed)
Patient ID: Erika Leach, female   DOB: 1951-06-26, 61 y.o.   MRN: [id number]    SUBJECTIVE:   Erika Leach is a 61 y/o F with h/o HTN, asthma/COPD.   S/p PPM on 12/18 for CHB and symptomatic brady. Has had severe HTN.  IV NTG weaned off. Norvasc and labetalol increased yesterday but SBP still in 170s.  Feels good. Wants to go home.    PHYSICAL EXAM Filed Vitals:   06/27/12 2112 06/27/12 2113 06/27/12 2349 06/28/12 0500  BP: 172/111 172/111 157/89 162/90  Pulse:   60 62  Temp:    97.9 F (36.6 C)  TempSrc:      Resp:    18  Height:      Weight:      SpO2:    93%   General:  Sitting in chair No distress Neck. No JVD Lungs:  Clear Heart:  RRR Chest:  Wound OK Abdomen:  Positive bowel sounds, no rebound no guarding Extremities:  No edema, clubbing or cyanosis Neuro: nonfocal. A & O x 3. Affect pleasant.   LABS: Lab Results  Component Value Date   TROPONINI <0.30 06/23/2012   No results found for this or any previous visit (from the past 24 hour(s)).  Intake/Output Summary (Last 24 hours) at 06/28/12 0857 Last data filed at 06/27/12 2100  Gross per 24 hour  Intake    840 ml  Output    300 ml  Net    540 ml   Current facility-administered medications:0.9 %  sodium chloride infusion, 250 mL, Intravenous, PRN, Laurann Montana, PA, Last Rate: 10 mL/hr at 06/24/12 2011, 250 mL at 06/24/12 2011;  0.9 %  sodium chloride infusion, , Intravenous, Continuous, Rollene Rotunda, MD;  acetaminophen (TYLENOL) tablet 325-650 mg, 325-650 mg, Oral, Q4H PRN, Duke Salvia, MD albuterol (PROVENTIL HFA;VENTOLIN HFA) 108 (90 BASE) MCG/ACT inhaler 2 puff, 2 puff, Inhalation, Q6H PRN, Dayna N Dunn, PA;  amLODipine (NORVASC) tablet 10 mg, 10 mg, Oral, Daily, Rollene Rotunda, MD, 10 mg at 06/27/12 4098;  cloNIDine (CATAPRES) tablet 0.1 mg, 0.1 mg, Oral, BID, Dolores Patty, MD, 0.1 mg at 06/27/12 2113;  hydrALAZINE (APRESOLINE) injection 10 mg, 10 mg, Intravenous, Q4H PRN, Rollene Rotunda,  MD HYDROcodone-acetaminophen (NORCO/VICODIN) 5-325 MG per tablet 1-2 tablet, 1-2 tablet, Oral, Q4H PRN, Laurann Montana, PA, 2 tablet at 06/27/12 2113;  lisinopril (PRINIVIL,ZESTRIL) tablet 20 mg, 20 mg, Oral, BID, Rollene Rotunda, MD, 20 mg at 06/27/12 2112;  nitroGLYCERIN (NITROSTAT) SL tablet 0.4 mg, 0.4 mg, Sublingual, Q5 Min x 3 PRN, Dayna N Dunn, PA;  ondansetron (ZOFRAN) injection 4 mg, 4 mg, Intravenous, Q6H PRN, Duke Salvia, MD sodium chloride 0.9 % injection 3 mL, 3 mL, Intravenous, Q12H, Dayna N Dunn, PA, 3 mL at 06/27/12 2112;  sodium chloride 0.9 % injection 3 mL, 3 mL, Intravenous, PRN, Laurann Montana, PA  ASSESSMENT AND PLAN:  Complete heart block:  Status post permanent pacemaker.  Hopefully home today.     HTN (hypertension), discussed importance of compliance she was about 150-160 systolic this am Ok to D/C  Outpat sleep study per Dr. Graciela Husbands.   F/U [facility] and [facility]    Charlton Haws 06/28/2012 8:57 AM

## 2012-06-28 NOTE — Discharge Instructions (Signed)
Supplemental Discharge Instructions for  Pacemaker Patients  Activity No heavy lifting or vigorous activity with your left/right arm for 4 to 6 weeks.  Do not raise your left/right arm above your head for one week.  Gradually raise your affected arm as drawn below.          Today                      06/29/12                   06/30/12                07/01/12 NO DRIVING for 3 DAYS; you may begin driving on 16/10/96. WOUND CARE   Keep the wound area clean and dry.  Do not get this area wet for 3 DAYS. No showers for 3 DAYS; you may shower on 07/01/12.   The tape/steri-strips on your wound will fall off; do not pull them off.  No bandage is needed on the site.  DO  NOT apply any creams, oils, or ointments to the wound area.   If you notice any drainage or discharge from the wound, any swelling or bruising at the site, or you develop a fever > 101? F after you are discharged home, call the office at once.  Special Instructions   You are still able to use cellular telephones; use the ear opposite the side where you have your pacemaker/defibrillator.  Avoid carrying your cellular phone near your device.   When traveling through airports, show security personnel your identification card to avoid being screened in the metal detectors.  Ask the security personnel to use the hand wand.   Avoid arc welding equipment, MRI testing (magnetic resonance imaging), TENS units (transcutaneous nerve stimulators).  Call the office for questions about other devices.   Avoid electrical appliances that are in poor condition or are not properly grounded.   Microwave ovens are safe to be near or to operate.  Third Degree Atrioventricular Block Third degree atrioventricular block is a type of heart block. The heartbeat is a coordinated contraction between the upper and lower chambers of the heart. This coordinated contraction happens because of an electrical impulse that is sent from the upper chambers of the heart to  the lower chambers of the heart. Normally, this electrical impulse is transmitted without problems. In third degree heart block, also known as complete heart block, the heart's electrical impulse does not pass from the upper chambers of the heart to the lower chambers of the heart. The heart's lower chambers beat independently from the upper chambers. This causes the heart to beat at a very slow rate and does not allow the heart to pump blood very well. Third degree heart block is serious and can result in death.  CAUSES   Heart attack. A heart attack can cause scarring which can damage the heart's electrical system.  Aging can cause degeneration and scarring of the heart's electrical system.  Heart medication such as beta blockers or calcium channel blockers. These kinds of medications can slow the heart rate. They can affect the electrical impulse of the heart if the dosage is too high.  Open heart surgery can affect the electrical pathway of the heart. SYMPTOMS   Symptoms may include the following:  Fainting or near fainting.  Feeling dizzy or lightheaded.  Difficulty breathing or shortness or breath.  Chest pain.  Fatigue.  Swelling of the lower legs. DIAGNOSIS  Third degree heart block can be diagnosed by:   An electrocardiogram (EKG). An EKG is a tracing of the heartbeat and can show a 3rd degree heart block.  Electrophysiology (EP) study. This is a procedure which tests the electrical pathway of your heart. This type of test is done by a specialist who places a catheter (long thin tube) in your heart. The catheter is used to study your heart and record your heart's electrical signals. TREATMENT   3rd degree heart block requires hospitalization and continuous heart rhythm monitoring.  Most people with 3rd degree heart block will need a permanent pacemaker to help your heart beat.  If a permanent pacemaker cannot be placed immediately, a temporary pacemaker may be  needed.  Heart medications such beta blockers or calcium channel blockers can slow the heart rate. Your caregiver may need to adjust your heart medication if this is the cause of your heart block. SEEK MEDICAL CARE IF:   You have unexplained fatigue.  You feel lightheaded.  You feel faint.  You feel your heart skipping beats or your heart beats very fast. SEEK IMMEDIATE MEDICAL CARE IF:   You have severe chest pain, especially if the pain is crushing or pressure-like and spreads to the arms, back, neck, or jaw. THIS IS AN EMERGENCY. Do not wait to see if the pain will go away. Get medical help at once. Call your local emergency services (911 in the [place] DO NOT drive yourself to the hospital.  You notice increasing shortness of breath during rest, sleeping, or with activity.  You "black out" or faint.  You have swelling in your lower legs. MAKE SURE YOU:   Understand these instructions.  Will watch your condition.  Will get help right away if you are not doing well or get worse. Document Released: 06/06/2008 Document Revised: 09/16/2011 Document Reviewed: 06/06/2008 [facility] Patient Information 2013 [place], [place].   PLEASE TAKE ALL NEW MEDICATIONS/MEDICATION CHANGES AS PRESCRIBED.   PLEASE ATTEND ALL RECOMMENDED FOLLOW-UP APPOINTMENTS.

## 2012-06-28 NOTE — Discharge Summary (Signed)
Discharge Summary   Patient ID: Erika Leach,  MRN: [id number], DOB/AGE: Aug 16, 1950 61 y.o.  Admit date: 06/23/2012 Discharge date: 06/28/2012  Primary Physician: No primary provider on file. Primary Cardiologist: New - Hochrein  Discharge Diagnoses Principal Problem:  *Complete heart block Active Problems:  Uncontrolled hypertension  Medical non-compliance  Mild pulmonary hypertension  Fracture of sesamoid bone of foot, closed  Fall down steps  Hypertensive urgency  Asthma  Allergies No Known Allergies  Diagnostic Studies/Procedures  PA/LATERAL CHEST X-RAY- 06/23/12  IMPRESSION:  No active disease. No significant change.   RIGHT FOOT RADIOGRAPH - 06/23/12  IMPRESSION:  There is a lucent line in the medial sesamoid adjacent to distal  aspect first metatarsal. Sesamoid bone fracture cannot be  excluded. Clinical correlation is necessary.  RIGHT SHOULDER RADIOGRAPH - 06/23/12  IMPRESSION:  No acute finding. Acromioclavicular degenerative disease.   TRANSTHORACIC ECHOCARDIOGRAM - 06/24/12  - Left ventricle: The cavity size was normal. Wall thickness was increased in a pattern of mild LVH. The estimated ejection fraction was 60%. Wall motion was normal; there were no regional wall motion abnormalities.  - Right ventricle: The cavity size was mildly dilated. - Pulmonary arteries: PA peak pressure: 46mm Hg (S).  PORTABLE CHEST X-RAY - 06/25/12  IMPRESSION:  Satisfactorily positioned pacemaker leads as described. No  demonstrated complication.  MEDTRONIC DUAL-CHAMBER PACEMAKER IMPLANTATION - 06/24/12  Successful without apparent complication. See full operative note on 06/24/12 by Dr. Graciela Husbands for full details.   History of Present Illness  Erika Leach is a 61yo who was admitted to [facility] on [facility]/23/12 with the above problem list. She has a history of poorly controlled HTN, asthma and tobacco abuse. She presented to [facility] ER on 12/17 after experiencing a  mechanical fall at home walking up steps with groceries in her had. She fell on her R side, striking her R shoulder. She noticed pain in her shoulder and R foot thereafter. In the ED, she was found to be in complete heart block, HR 35-50s, BP 242/87. She did report inconsistency in taking her BP meds. She did endorse worsening DOE over several months and easy fatigueability. She did note occasional "pings" of chest pain, never sustained beyond 1-2 seconds, non-exertional. No CHF-type symptoms. As above, CXR revealed no acute process. R shoulder radiograph showed no traumatic changes. R foot radiograph showed sesmoid bone fracture. On evaluation, cath was deferred as it was felt CHB was not an ischemic rhythm. Initial TnI WNL. The patient was started on IV NTG gtt and ACEi for careful BP reduction. The plan was made to admit to [facility] ICU to await PPM placement. She remained hemodynamically stable otherwise.   Hospital Course   She was seen by the orthopedic service who determined no need for surgical intervention of her R foot sesamoid fracture. CAM boot with platform walker (given arm movement restrictions with PPM placement and inability to use crutches) was placed and recommended, respectively. The patient remained stable overnight. She was continued on NTG gtt and ACEi with SBP reduction to 160s. A 2D echo was ordered revealing LVEF 60%, mild LVH, mild RV dilatation and PASP 46 mmHg. EP was consulted the following morning, and the patient was informed, consented and prepped for PPM placement. The full details of the procedure are noted in the operative report. This resulted in successful placement of a Medtronic dual-chamber PPM without complications. Post-op CXR revealed no evidence of PTX. The recommendation was made by Dr. Graciela Husbands to pursue outpatient sleep study  and rule out pulmonary sarcoidosis. The device was interrogated and found to be working properly. The extent of the admission thereafter consisted of  antihypertensive titrations and adjustments to simplify her regimen and improve compliance. She remained asymptomatic. SBP remained consistent at 150-160s. She was evaluated by Dr. Eden Emms this morning, and deemed stable for discharge. She will be discharged on the medications below. She will follow-up in the device clinic in 7-10 days, with Dr. Graciela Husbands in 3 months and with Dr. Antoine Poche in 4 weeks. BP will be checked at the device clinic. She has been advised to follow-up with her PCP for outpatient sleep study. Platform walker has been arranged prior to discharge. Ortho has recommended follow-up next week, and has been advised to the patient. This information, including post-device instructions, has been clearly outlined in the discharge AVS.   Discharge Vitals:  Blood pressure 162/90, pulse 62, temperature 97.9 F (36.6 C), temperature source Oral, resp. rate 18, height 5\' 6"  (1.676 m), weight 86.1 kg (189 lb 13.1 oz), SpO2 93.00%.   Labs:  Lab 06/24/12 0500 06/23/12 1456  NA 138 142  K 4.0 4.3  CL 101 104  CO2 26 --  BUN 15 11  CREATININE 0.83 0.90  CALCIUM 9.5 --  PROT -- --  BILITOT -- --  ALKPHOS -- --  ALT -- --  AST -- --  AMYLASE -- --  LIPASE -- --  GLUCOSE 145* 95   Disposition:  Discharge Orders    Future Appointments: Provider: Department: Dept Phone: Center:   07/06/2012 12:30 PM [facility] [employer] [employer] Main Office [place]) [phone] [facility]   09/29/2012 10:15 AM Duke Salvia, MD [facility] Main Office [place]) [phone] [facility]     Future Orders Please Complete By Expires   Diet - low sodium heart healthy      Increase activity slowly        Follow-up Information    Follow up with [facility]. (Office will call you with an appointment date and time. )    Contact information:   [address] [place] [address]       Please follow up. (Please establish/follow-up with your primary doctor for an  outpatient sleep study to assess for sleep apnea. )       Follow up with Alvy Beal, MD. (Please call the office for a follow-up appointment for leg fracture. )    Contact information:   [address] [place] [address] [phone]          Discharge Medications:    Medication List     As of 06/28/2012 11:47 AM    START taking these medications         amLODipine 10 MG tablet   Commonly known as: NORVASC   Take 1 tablet (10 mg total) by mouth daily.      cloNIDine 0.1 MG tablet   Commonly known as: CATAPRES   Take 1 tablet (0.1 mg total) by mouth 2 (two) times daily.      HYDROcodone-acetaminophen 5-325 MG per tablet   Commonly known as: NORCO/VICODIN   Take 1-2 tablets by mouth every 4 (four) hours as needed.      lisinopril 20 MG tablet   Commonly known as: PRINIVIL,ZESTRIL   Take 1 tablet (20 mg total) by mouth 2 (two) times daily.      CONTINUE taking these medications         OVER THE  COUNTER MEDICATION          Where to get your medications    These are the prescriptions that you need to pick up. We sent them to a specific pharmacy, so you will need to go there to get them.   [id number] [place], [place] - [address]    [address] [place] [address]    Phone: [phone]        amLODipine 10 MG tablet   cloNIDine 0.1 MG tablet   lisinopril 20 MG tablet         You may get these medications from any pharmacy.         HYDROcodone-acetaminophen 5-325 MG per tablet           Outstanding Labs/Studies: None  Duration of Discharge Encounter: Greater than 30 minutes including physician time.  Signed, R. Hurman Horn, PA-C 06/28/2012, 11:47 AM

## 2012-07-06 ENCOUNTER — Ambulatory Visit (INDEPENDENT_AMBULATORY_CARE_PROVIDER_SITE_OTHER): Payer: Self-pay | Admitting: *Deleted

## 2012-07-06 ENCOUNTER — Encounter: Payer: Self-pay | Admitting: Internal Medicine

## 2012-07-06 DIAGNOSIS — Z950 Presence of cardiac pacemaker: Secondary | ICD-10-CM

## 2012-07-06 DIAGNOSIS — I442 Atrioventricular block, complete: Secondary | ICD-10-CM

## 2012-07-06 LAB — PACEMAKER DEVICE OBSERVATION
AL AMPLITUDE: 4 mv
AL IMPEDENCE PM: 624 Ohm
AL THRESHOLD: 0.75 V
ATRIAL PACING PM: 29
BAMS-0001: 150 {beats}/min
BATTERY VOLTAGE: 2.79 V
RV LEAD AMPLITUDE: 5.6 mv
RV LEAD IMPEDENCE PM: 547 Ohm
RV LEAD THRESHOLD: 0.75 V
VENTRICULAR PACING PM: 97

## 2012-07-06 NOTE — Progress Notes (Signed)
Patient presents for wound check of recently implanted pacemaker.   No problems with shortness of breath, chest pain, palpitations, or syncope.  Wound well healed.  Device interrogated and found to be functioning normally.  No changes made today.  See PaceArt report for full details.  Plan ROV with Dr. Graciela Husbands in 3 months.  Gypsy Balsam, RN, BSN 07/06/2012 12:27 PM

## 2012-07-08 DIAGNOSIS — R079 Chest pain, unspecified: Secondary | ICD-10-CM

## 2012-07-08 HISTORY — DX: Chest pain, unspecified: R07.9

## 2012-07-14 ENCOUNTER — Encounter: Payer: Self-pay | Admitting: *Deleted

## 2012-09-29 ENCOUNTER — Ambulatory Visit (INDEPENDENT_AMBULATORY_CARE_PROVIDER_SITE_OTHER): Payer: Medicaid Other | Admitting: Internal Medicine

## 2012-09-29 ENCOUNTER — Encounter: Payer: Self-pay | Admitting: Internal Medicine

## 2012-09-29 VITALS — BP 122/78 | HR 91 | Ht 66.0 in | Wt 187.2 lb

## 2012-09-29 DIAGNOSIS — Z950 Presence of cardiac pacemaker: Secondary | ICD-10-CM

## 2012-09-29 DIAGNOSIS — R000 Tachycardia, unspecified: Secondary | ICD-10-CM | POA: Insufficient documentation

## 2012-09-29 DIAGNOSIS — I442 Atrioventricular block, complete: Secondary | ICD-10-CM

## 2012-09-29 HISTORY — DX: Presence of cardiac pacemaker: Z95.0

## 2012-09-29 LAB — CBC WITH DIFFERENTIAL/PLATELET
Basophils Absolute: 0 10*3/uL (ref 0.0–0.1)
Basophils Relative: 0.4 % (ref 0.0–3.0)
Eosinophils Absolute: 0.1 10*3/uL (ref 0.0–0.7)
Eosinophils Relative: 2 % (ref 0.0–5.0)
HCT: 40.1 % (ref 36.0–46.0)
Hemoglobin: 13.4 g/dL (ref 12.0–15.0)
Lymphocytes Relative: 41.5 % (ref 12.0–46.0)
Lymphs Abs: 2.3 10*3/uL (ref 0.7–4.0)
MCHC: 33.4 g/dL (ref 30.0–36.0)
MCV: 85.7 fl (ref 78.0–100.0)
Monocytes Absolute: 0.6 10*3/uL (ref 0.1–1.0)
Monocytes Relative: 10.4 % (ref 3.0–12.0)
Neutro Abs: 2.5 10*3/uL (ref 1.4–7.7)
Neutrophils Relative %: 45.7 % (ref 43.0–77.0)
Platelets: 239 10*3/uL (ref 150.0–400.0)
RBC: 4.68 Mil/uL (ref 3.87–5.11)
RDW: 15.4 % — ABNORMAL HIGH (ref 11.5–14.6)
WBC: 5.5 10*3/uL (ref 4.5–10.5)

## 2012-09-29 LAB — PACEMAKER DEVICE OBSERVATION
AL AMPLITUDE: 5.6 mv
AL IMPEDENCE PM: 532 Ohm
AL THRESHOLD: 0.75 V
ATRIAL PACING PM: 1
BAMS-0001: 150 {beats}/min
BATTERY VOLTAGE: 2.79 V
RV LEAD AMPLITUDE: 8 mv
RV LEAD IMPEDENCE PM: 509 Ohm
RV LEAD THRESHOLD: 0.75 V
VENTRICULAR PACING PM: 89

## 2012-09-29 MED ORDER — DILTIAZEM HCL ER COATED BEADS 240 MG PO CP24: 240.0000 mg | ORAL_CAPSULE | Freq: Every day | ORAL | Status: DC

## 2012-09-29 NOTE — Assessment & Plan Note (Signed)
imporved control

## 2012-09-29 NOTE — Patient Instructions (Signed)
Your physician has recommended you make the following change in your medication:  1) Stop amlodipine (norvasc) 2) Start diltiazem (cardizem) 240 mg once daily  Your physician recommends that you have lab work today: cbc  Your physician recommends that you schedule a follow-up appointment in: 3 months with Kristin/ Gunnar Fusi in the device clinic  Your physician wants you to follow-up in: 1 year with Dr. Graciela Husbands. You will receive a reminder letter in the mail two months in advance. If you don't receive a letter, please call our office to schedule the follow-up appointment.

## 2012-09-29 NOTE — Assessment & Plan Note (Signed)
The patient's device was interrogated and the information was fully reviewed.  The device was reprogrammed to  Maximize longevity 

## 2012-09-29 NOTE — Progress Notes (Signed)
Patient has no care team.   HPI  Erika Leach is a 62 y.o. female een in followup for a pacemaker implanted for complete heart block 12/13 after she presented with a fall. Echocardiogram demonstrated normal left ventricular function  Some discomfort at the site  No other major complaints  Past Medical History  Diagnosis Date  . Asthma   . HTN (hypertension)     poorly controlled  . Complete heart block 06/24/2012    Past Surgical History  Procedure Laterality Date  . Tubal ligation      Current Outpatient Prescriptions  Medication Sig Dispense Refill  . amLODipine (NORVASC) 10 MG tablet Take 1 tablet (10 mg total) by mouth daily.  30 tablet  3  . cloNIDine (CATAPRES) 0.1 MG tablet Take 1 tablet (0.1 mg total) by mouth 2 (two) times daily.  60 tablet  3  . HYDROcodone-acetaminophen (NORCO/VICODIN) 5-325 MG per tablet Take 1-2 tablets by mouth every 4 (four) hours as needed.  14 tablet  0  . lisinopril (PRINIVIL,ZESTRIL) 20 MG tablet Take 1 tablet (20 mg total) by mouth 2 (two) times daily.  60 tablet  3  . OVER THE COUNTER MEDICATION Take 2 tablets by mouth once. For pain.       No current facility-administered medications for this visit.    No Known Allergies  Review of Systems negative except from HPI and PMH  Physical Exam BP 122/78  Pulse 91  Ht 5\' 6"  (1.676 m)  Wt 187 lb 3.2 oz (84.913 kg)  BMI 30.23 kg/m2  SpO2 95% Well developed and well nourished in no acute distress HENT normal E scleral and icterus clear Neck Supple   Clear to ausculation \\Device  pocket well healed; without hematoma or erythema   Regular rate and rhythm, no murmurs gallops or rub Soft with active bowel sounds No clubbing cyanosis none Edema Alert and oriented, grossly normal motor and sensory function Skin Warm and Dry    Assessment and  Plan

## 2012-09-29 NOTE — Assessment & Plan Note (Addendum)
Relative tachy will check cbc and change amlodipine to dilt

## 2012-09-29 NOTE — Assessment & Plan Note (Signed)
Stable post pacing 

## 2012-10-30 ENCOUNTER — Other Ambulatory Visit: Payer: Self-pay | Admitting: Internal Medicine

## 2012-10-30 DIAGNOSIS — Z1231 Encounter for screening mammogram for malignant neoplasm of breast: Secondary | ICD-10-CM

## 2012-11-12 ENCOUNTER — Other Ambulatory Visit: Payer: Self-pay | Admitting: Physician Assistant

## 2012-11-12 ENCOUNTER — Ambulatory Visit
Admission: RE | Admit: 2012-11-12 | Discharge: 2012-11-12 | Disposition: A | Discharge status: Home / Self Care | Payer: Medicaid Other | Source: Ambulatory Visit | Attending: Internal Medicine | Admitting: Internal Medicine

## 2012-11-12 DIAGNOSIS — Z1231 Encounter for screening mammogram for malignant neoplasm of breast: Secondary | ICD-10-CM

## 2012-11-18 LAB — IFOBT (OCCULT BLOOD): IFOBT: POSITIVE

## 2012-11-26 ENCOUNTER — Telehealth: Payer: Self-pay

## 2012-11-26 NOTE — Telephone Encounter (Signed)
Rec'd from [employer] forward 14 pages to GI Floor

## 2012-11-27 ENCOUNTER — Encounter: Payer: Self-pay | Admitting: Gastroenterology

## 2012-12-04 ENCOUNTER — Other Ambulatory Visit: Payer: Self-pay | Admitting: *Deleted

## 2012-12-04 MED ORDER — DILTIAZEM HCL ER COATED BEADS 240 MG PO TB24: 240.0000 mg | ORAL_TABLET | Freq: Every day | ORAL | Status: DC

## 2012-12-16 ENCOUNTER — Encounter: Payer: Self-pay | Admitting: Pharmacist

## 2012-12-16 NOTE — Telephone Encounter (Signed)
Received fax from pharmacy stating diltiazem CD needed a PA.  [employer].  That is no longer the preferred agent, but they do cover the [place] formulation.  Will change agent and send new Rx to pharmacy.  This encounter was created in error - please disregard.

## 2012-12-22 ENCOUNTER — Encounter: Payer: Self-pay | Admitting: Internal Medicine

## 2012-12-23 ENCOUNTER — Ambulatory Visit (INDEPENDENT_AMBULATORY_CARE_PROVIDER_SITE_OTHER): Payer: Medicaid Other | Admitting: Gastroenterology

## 2012-12-23 ENCOUNTER — Encounter: Payer: Self-pay | Admitting: Gastroenterology

## 2012-12-23 VITALS — BP 128/72 | HR 72 | Ht 66.0 in | Wt 187.8 lb

## 2012-12-23 DIAGNOSIS — R195 Other fecal abnormalities: Secondary | ICD-10-CM | POA: Insufficient documentation

## 2012-12-23 DIAGNOSIS — K5900 Constipation, unspecified: Secondary | ICD-10-CM

## 2012-12-23 MED ORDER — NA SULFATE-K SULFATE-MG SULF 17.5-3.13-1.6 GM/177ML PO SOLN: 1.0000 | Freq: Once | ORAL | Status: DC

## 2012-12-23 NOTE — Assessment & Plan Note (Signed)
Constipation is probably related to narcotic use. Patient will consider enrollment in a constipation/narcotic trial.

## 2012-12-23 NOTE — Progress Notes (Signed)
History of Present Illness: 62yo AA female with history of complete heart block, status post pacemaker insertion, referred for evaluation of Hemoccult-positive stool. This was noted on routine testing. The patient has no GI complaints except for constipation. She takes narcotics for joint pains. She denies rectal bleeding or melena. There is no history of change in bowel habits, abdominal pain, pyrosis, or dysphagia. She's on no regular gastric irritants including nonsteroidals.  Hemoglobin in April, 2014 was 13.8    Past Medical History  Diagnosis Date  . Asthma   . HTN (hypertension)     poorly controlled  . Complete heart block 06/24/2012   Past Surgical History  Procedure Laterality Date  . Tubal ligation    . Appendectomy     family history includes Breast cancer in her sister; Heart attack in her brother and mother; and Pneumonia in her father. Current Outpatient Prescriptions  Medication Sig Dispense Refill  . cloNIDine (CATAPRES) 0.1 MG tablet TAKE ONE TABLET BY MOUTH TWICE DAILY  60 tablet  5  . diltiazem (CARDIZEM LA) 240 MG 24 hr tablet Take 1 tablet (240 mg total) by mouth daily.  30 tablet  12  . HYDROcodone-acetaminophen (NORCO/VICODIN) 5-325 MG per tablet Take 1-2 tablets by mouth every 4 (four) hours as needed.  14 tablet  0  . lisinopril (PRINIVIL,ZESTRIL) 20 MG tablet TAKE ONE TABLET BY MOUTH TWICE DAILY  60 tablet  5  . OVER THE COUNTER MEDICATION Take 2 tablets by mouth once. For pain.       No current facility-administered medications for this visit.   Allergies as of 12/23/2012  . (No Known Allergies)    reports that she has been smoking Cigarettes.  She has a 40 pack-year smoking history. She has never used smokeless tobacco. She reports that she does not drink alcohol or use illicit drugs.     Review of Systems: He complains of frequent joint pains Pertinent positive and negative review of systems were noted in the above HPI section. All other review of  systems were otherwise negative.  Vital signs were reviewed in today's medical record Physical Exam: General: Well developed , well nourished, no acute distress Skin: anicteric Head: Normocephalic and atraumatic Eyes:  sclerae anicteric, EOMI Ears: Normal auditory acuity Mouth: No deformity or lesions Neck: Supple, no masses or thyromegaly Lungs: Clear throughout to auscultation Heart: Regular rate and rhythm; no murmurs, rubs or bruits Abdomen: Soft, non tender and non distended. No masses, hepatosplenomegaly or hernias noted. Normal Bowel sounds Rectal:deferred Musculoskeletal: Symmetrical with no gross deformities  Skin: No lesions on visible extremities Pulses:  Normal pulses noted Extremities: No clubbing, cyanosis, edema or deformities noted Neurological: Alert oriented x 4, grossly nonfocal Cervical Nodes:  No significant cervical adenopathy Inguinal Nodes: No significant inguinal adenopathy Psychological:  Alert and cooperative. Normal mood and affect

## 2012-12-23 NOTE — Patient Instructions (Addendum)
You have been given a separate informational sheet regarding your tobacco use, the importance of quitting and local resources to help you quit. You have been scheduled for a colonoscopy with propofol. Please follow written instructions given to you at your visit today.  Please pick up your prep kit at the pharmacy within the next 1-3 days. If you use inhalers (even only as needed), please bring them with you on the day of your procedure. Your physician has requested that you go to www.[email or web address].com and enter the access code given to you at your visit today. This web site gives a general overview about your procedure. However, you should still follow specific instructions given to you by our office regarding your preparation for the procedure. 

## 2012-12-23 NOTE — Assessment & Plan Note (Signed)
Hemoccult-positive stool. GI sources including polyps, hemorrhoids, AVMs and neoplasm should be ruled out  Recommendations #1 colonoscopy #2 followup Hemoccults if colonoscopy is negative. Further GI workup pending these results.

## 2012-12-25 ENCOUNTER — Ambulatory Visit (AMBULATORY_SURGERY_CENTER): Payer: Medicaid Other | Admitting: Gastroenterology

## 2012-12-25 ENCOUNTER — Encounter: Payer: Self-pay | Admitting: Gastroenterology

## 2012-12-25 VITALS — BP 145/100 | HR 64 | Temp 98.8°F | Resp 11 | Ht 66.0 in | Wt 187.0 lb

## 2012-12-25 DIAGNOSIS — D126 Benign neoplasm of colon, unspecified: Secondary | ICD-10-CM

## 2012-12-25 DIAGNOSIS — R195 Other fecal abnormalities: Secondary | ICD-10-CM

## 2012-12-25 DIAGNOSIS — K5730 Diverticulosis of large intestine without perforation or abscess without bleeding: Secondary | ICD-10-CM

## 2012-12-25 MED ORDER — SODIUM CHLORIDE 0.9 % IV SOLN: 500.0000 mL | INTRAVENOUS | Status: DC

## 2012-12-25 NOTE — Op Note (Signed)
[facility] [address] [place], [address]   COLONOSCOPY PROCEDURE REPORT  PATIENT: Erika Leach, Erika Leach  MR#: [id number] BIRTHDATE: 1951-04-29 , 62  yrs. old GENDER: Female ENDOSCOPIST: Louis Meckel, MD REFERRED XB:JYNW Roseanne Reno, M.D. PROCEDURE DATE:  12/25/2012 PROCEDURE:   Colonoscopy with snare polypectomy ASA CLASS:   Class II INDICATIONS:heme-positive stool. MEDICATIONS: MAC sedation, administered by CRNA and propofol (Diprivan) 250mg  IV  DESCRIPTION OF PROCEDURE:   After the risks benefits and alternatives of the procedure were thoroughly explained, informed consent was obtained.  A digital rectal exam revealed no abnormalities of the rectum.   The LB [id number]  endoscope was introduced through the anus and advanced to the cecum, which was identified by both the appendix and ileocecal valve. No adverse events experienced.   The quality of the prep was excellent using Suprep  The instrument was then slowly withdrawn as the colon was fully examined.      COLON FINDINGS: A flat polyp was found in the descending colon.  A polypectomy was performed with a cold snare.  The resection was complete and the polyp tissue was completely retrieved.   Moderate diverticulosis was noted in the ascending colon.   Mild diverticulosis was noted in the sigmoid colon.   The colon mucosa was otherwise normal.  Retroflexed views revealed no abnormalities. The time to cecum=5 minutes 17 seconds.  Withdrawal time=10 minutes 03 seconds.  The scope was withdrawn and the procedure completed. COMPLICATIONS: There were no complications.  ENDOSCOPIC IMPRESSION: 1.   Flat polyp was found in the descending colon; polypectomy was performed with a cold snare 2.   Moderate diverticulosis was noted in the ascending colon 3.   Mild diverticulosis was noted in the sigmoid colon 4.   The colon mucosa was otherwise normal  findings do not explain cause for Hemoccult-positive  stool  RECOMMENDATIONS: 1.  Upper endoscopy will be scheduled 2.  If the polyp(s) removed today are proven to be adenomatous (pre-cancerous) polyps, you will need a repeat colonoscopy in 5 years.  Otherwise you should continue to follow colorectal cancer screening guidelines for "routine risk" patients with colonoscopy in 10 years.  You will receive a letter within 1-2 weeks with the results of your biopsy as well as final recommendations.  Please call my office if you have not received a letter after 3 weeks.   eSigned:  Louis Meckel, MD 12/25/2012 3:15 PM   cc:

## 2012-12-25 NOTE — Progress Notes (Signed)
Called to room to assist during endoscopic procedure.  Patient ID and intended procedure confirmed with present staff. Received instructions for my participation in the procedure from the performing physician. ewm 

## 2012-12-25 NOTE — Progress Notes (Signed)
Patient did not experience any of the following events: a burn prior to discharge; a fall within the facility; wrong site/side/patient/procedure/implant event; or a hospital transfer or hospital admission upon discharge from the facility. (G8907) Patient did not have preoperative order for IV antibiotic SSI prophylaxis. (G8918)  

## 2012-12-25 NOTE — Progress Notes (Signed)
Report to pacu rn, vss, bbs=clear 

## 2012-12-25 NOTE — Progress Notes (Signed)
Called to recovery room preop, regarding wheezs with breathing, an expiratory wheeze noted, patient smoking "much" prior to procedure nervous. Given albutrol x 3 breaths1 with improvement prior to procedure later.

## 2012-12-25 NOTE — Patient Instructions (Addendum)

## 2012-12-28 ENCOUNTER — Telehealth: Payer: Self-pay | Admitting: *Deleted

## 2012-12-28 NOTE — Telephone Encounter (Signed)
  Follow up Call-  Call back number 12/25/2012  Post procedure Call Back phone  # [phone]  Permission to leave phone message Yes     Patient questions:  Do you have a fever, pain , or abdominal swelling? no Pain Score  0 *  Have you tolerated food without any problems? yes  Have you been able to return to your normal activities? yes  Do you have any questions about your discharge instructions: Diet   no Medications  no Follow up visit  no  Do you have questions or concerns about your Care? no  Actions: * If pain score is 4 or above: No action needed, pain <4.

## 2012-12-30 ENCOUNTER — Encounter: Payer: Self-pay | Admitting: Internal Medicine

## 2013-01-01 ENCOUNTER — Encounter: Payer: Self-pay | Admitting: Internal Medicine

## 2013-01-01 ENCOUNTER — Ambulatory Visit (INDEPENDENT_AMBULATORY_CARE_PROVIDER_SITE_OTHER): Payer: Medicaid Other | Admitting: *Deleted

## 2013-01-01 DIAGNOSIS — I442 Atrioventricular block, complete: Secondary | ICD-10-CM

## 2013-01-01 LAB — PACEMAKER DEVICE OBSERVATION
AL AMPLITUDE: 4 mv
AL IMPEDENCE PM: 574 Ohm
AL THRESHOLD: 0.75 V
ATRIAL PACING PM: 15
BAMS-0001: 150 {beats}/min
BATTERY VOLTAGE: 2.79 V
RV LEAD AMPLITUDE: 5.6 mv
RV LEAD IMPEDENCE PM: 530 Ohm
RV LEAD THRESHOLD: 0.5 V
VENTRICULAR PACING PM: 92

## 2013-01-01 NOTE — Progress Notes (Signed)
PPM check in clinic.  All functions normal.  Turned on search AV+ to allow more of pt's intrinsic conduction.  ROV w/ Dr. Graciela Husbands in 6 mo.

## 2013-01-11 ENCOUNTER — Encounter: Payer: Self-pay | Admitting: Gastroenterology

## 2013-03-19 ENCOUNTER — Ambulatory Visit (HOSPITAL_COMMUNITY)
Admission: RE | Admit: 2013-03-19 | Discharge: 2013-03-19 | Disposition: A | Discharge status: Home / Self Care | Payer: Medicaid Other | Source: Ambulatory Visit | Attending: Internal Medicine | Admitting: Internal Medicine

## 2013-03-19 ENCOUNTER — Other Ambulatory Visit (HOSPITAL_COMMUNITY): Payer: Self-pay | Admitting: Internal Medicine

## 2013-03-19 DIAGNOSIS — I700 Atherosclerosis of aorta: Secondary | ICD-10-CM | POA: Insufficient documentation

## 2013-03-19 DIAGNOSIS — R030 Elevated blood-pressure reading, without diagnosis of hypertension: Secondary | ICD-10-CM | POA: Insufficient documentation

## 2013-03-19 DIAGNOSIS — R059 Cough, unspecified: Secondary | ICD-10-CM

## 2013-03-19 DIAGNOSIS — R05 Cough: Secondary | ICD-10-CM

## 2013-03-19 DIAGNOSIS — M549 Dorsalgia, unspecified: Secondary | ICD-10-CM | POA: Insufficient documentation

## 2013-03-19 DIAGNOSIS — Z950 Presence of cardiac pacemaker: Secondary | ICD-10-CM | POA: Insufficient documentation

## 2013-04-21 ENCOUNTER — Telehealth: Payer: Self-pay | Admitting: Internal Medicine

## 2013-04-21 NOTE — Telephone Encounter (Signed)
New Problem  Pt states she is having problems breathing... Chest Xray and EKG came back normal.. However pt is still experiencing SOB while active. Made appt w/ Dr. Antony Contras on

## 2013-04-21 NOTE — Telephone Encounter (Signed)
Spoke with patient who has appointment with Rick Duff [facility] next Friday 10/24 - patient will call if increased issues occur before then. Patient agreeable to plan.

## 2013-04-30 ENCOUNTER — Encounter: Payer: Self-pay | Admitting: Cardiology

## 2013-04-30 ENCOUNTER — Ambulatory Visit (INDEPENDENT_AMBULATORY_CARE_PROVIDER_SITE_OTHER): Payer: Medicaid Other | Admitting: Cardiology

## 2013-04-30 ENCOUNTER — Ambulatory Visit (HOSPITAL_COMMUNITY)
Admission: RE | Admit: 2013-04-30 | Discharge: 2013-04-30 | Disposition: A | Discharge status: Home / Self Care | Payer: Medicaid Other | Source: Ambulatory Visit | Attending: Cardiology | Admitting: Cardiology

## 2013-04-30 ENCOUNTER — Encounter (HOSPITAL_COMMUNITY): Payer: Self-pay | Admitting: Pharmacy Technician

## 2013-04-30 ENCOUNTER — Encounter: Payer: Self-pay | Admitting: Internal Medicine

## 2013-04-30 ENCOUNTER — Encounter: Payer: Self-pay | Admitting: *Deleted

## 2013-04-30 ENCOUNTER — Encounter (HOSPITAL_COMMUNITY)
Admission: RE | Disposition: A | Discharge status: Home / Self Care | Payer: Self-pay | Source: Ambulatory Visit | Attending: Cardiology

## 2013-04-30 VITALS — BP 106/76 | HR 75 | Ht 66.0 in | Wt 192.1 lb

## 2013-04-30 DIAGNOSIS — I10 Essential (primary) hypertension: Secondary | ICD-10-CM

## 2013-04-30 DIAGNOSIS — Z950 Presence of cardiac pacemaker: Secondary | ICD-10-CM | POA: Insufficient documentation

## 2013-04-30 DIAGNOSIS — I4891 Unspecified atrial fibrillation: Secondary | ICD-10-CM

## 2013-04-30 DIAGNOSIS — I442 Atrioventricular block, complete: Secondary | ICD-10-CM

## 2013-04-30 DIAGNOSIS — R0602 Shortness of breath: Secondary | ICD-10-CM

## 2013-04-30 DIAGNOSIS — R0609 Other forms of dyspnea: Secondary | ICD-10-CM

## 2013-04-30 DIAGNOSIS — Z720 Tobacco use: Secondary | ICD-10-CM

## 2013-04-30 DIAGNOSIS — F17200 Nicotine dependence, unspecified, uncomplicated: Secondary | ICD-10-CM | POA: Insufficient documentation

## 2013-04-30 DIAGNOSIS — R0989 Other specified symptoms and signs involving the circulatory and respiratory systems: Secondary | ICD-10-CM

## 2013-04-30 DIAGNOSIS — R0600 Dyspnea, unspecified: Secondary | ICD-10-CM

## 2013-04-30 DIAGNOSIS — R079 Chest pain, unspecified: Secondary | ICD-10-CM

## 2013-04-30 DIAGNOSIS — Z8249 Family history of ischemic heart disease and other diseases of the circulatory system: Secondary | ICD-10-CM

## 2013-04-30 DIAGNOSIS — Z79899 Other long term (current) drug therapy: Secondary | ICD-10-CM | POA: Insufficient documentation

## 2013-04-30 HISTORY — PX: LEFT HEART CATHETERIZATION WITH CORONARY ANGIOGRAM: SHX5451

## 2013-04-30 LAB — PACEMAKER DEVICE OBSERVATION
AL AMPLITUDE: 2.8 mv
AL IMPEDENCE PM: 540 Ohm
AL THRESHOLD: 0.5 V
ATRIAL PACING PM: 19.2
BAMS-0001: 150 {beats}/min
BATTERY VOLTAGE: 2.8 V
RV LEAD AMPLITUDE: 8 mv
RV LEAD IMPEDENCE PM: 517 Ohm
RV LEAD THRESHOLD: 0.75 V
VENTRICULAR PACING PM: 83.8

## 2013-04-30 LAB — CBC WITH DIFFERENTIAL/PLATELET
Basophils Absolute: 0 10*3/uL (ref 0.0–0.1)
Basophils Relative: 0 % (ref 0–1)
Eosinophils Absolute: 0.2 10*3/uL (ref 0.0–0.7)
Eosinophils Relative: 2 % (ref 0–5)
HCT: 39.5 % (ref 36.0–46.0)
Hemoglobin: 13.5 g/dL (ref 12.0–15.0)
Lymphocytes Relative: 49 % — ABNORMAL HIGH (ref 12–46)
Lymphs Abs: 4.7 10*3/uL — ABNORMAL HIGH (ref 0.7–4.0)
MCH: 29.2 pg (ref 26.0–34.0)
MCHC: 34.2 g/dL (ref 30.0–36.0)
MCV: 85.5 fL (ref 78.0–100.0)
Monocytes Absolute: 0.8 10*3/uL (ref 0.1–1.0)
Monocytes Relative: 8 % (ref 3–12)
Neutro Abs: 3.8 10*3/uL (ref 1.7–7.7)
Neutrophils Relative %: 41 % — ABNORMAL LOW (ref 43–77)
Platelets: 293 10*3/uL (ref 150–400)
RBC: 4.62 MIL/uL (ref 3.87–5.11)
RDW: 13.7 % (ref 11.5–15.5)
WBC: 9.5 10*3/uL (ref 4.0–10.5)

## 2013-04-30 LAB — BASIC METABOLIC PANEL
BUN: 13 mg/dL (ref 6–23)
CO2: 26 mEq/L (ref 19–32)
Calcium: 10 mg/dL (ref 8.4–10.5)
Chloride: 100 mEq/L (ref 96–112)
Creat: 0.88 mg/dL (ref 0.50–1.10)
Glucose, Bld: 107 mg/dL — ABNORMAL HIGH (ref 70–99)
Potassium: 4.2 mEq/L (ref 3.5–5.3)
Sodium: 139 mEq/L (ref 135–145)

## 2013-04-30 LAB — PROTIME-INR
INR: 1.01 (ref 0.00–1.49)
Prothrombin Time: 13.1 seconds (ref 11.6–15.2)

## 2013-04-30 SURGERY — LEFT HEART CATHETERIZATION WITH CORONARY ANGIOGRAM
Anesthesia: LOCAL

## 2013-04-30 MED ORDER — SODIUM CHLORIDE 0.9 % IV SOLN: 250.0000 mL | INTRAVENOUS | Status: DC | PRN

## 2013-04-30 MED ORDER — FENTANYL CITRATE 0.05 MG/ML IJ SOLN
INTRAMUSCULAR | Status: AC
  Filled 2013-04-30: qty 2

## 2013-04-30 MED ORDER — ASPIRIN 81 MG PO CHEW
81.0000 mg | CHEWABLE_TABLET | ORAL | Status: AC
  Administered 2013-04-30: 81 mg via ORAL
  Filled 2013-04-30: qty 1

## 2013-04-30 MED ORDER — LIDOCAINE HCL (PF) 1 % IJ SOLN
INTRAMUSCULAR | Status: AC
  Filled 2013-04-30: qty 30

## 2013-04-30 MED ORDER — SODIUM CHLORIDE 0.9 % IJ SOLN: 3.0000 mL | Freq: Two times a day (BID) | INTRAMUSCULAR | Status: DC

## 2013-04-30 MED ORDER — ONDANSETRON HCL 4 MG/2ML IJ SOLN: 4.0000 mg | Freq: Four times a day (QID) | INTRAMUSCULAR | Status: DC | PRN

## 2013-04-30 MED ORDER — SODIUM CHLORIDE 0.9 % IJ SOLN: 3.0000 mL | INTRAMUSCULAR | Status: DC | PRN

## 2013-04-30 MED ORDER — SODIUM CHLORIDE 0.9 % IV SOLN: 1.0000 mL/kg/h | INTRAVENOUS | Status: DC

## 2013-04-30 MED ORDER — ACETAMINOPHEN 325 MG PO TABS: 650.0000 mg | ORAL_TABLET | ORAL | Status: DC | PRN

## 2013-04-30 MED ORDER — HEPARIN (PORCINE) IN NACL 2-0.9 UNIT/ML-% IJ SOLN
INTRAMUSCULAR | Status: AC
  Filled 2013-04-30: qty 1500

## 2013-04-30 MED ORDER — VERAPAMIL HCL 2.5 MG/ML IV SOLN
INTRAVENOUS | Status: AC
  Filled 2013-04-30: qty 2

## 2013-04-30 MED ORDER — MIDAZOLAM HCL 2 MG/2ML IJ SOLN
INTRAMUSCULAR | Status: AC
  Filled 2013-04-30: qty 2

## 2013-04-30 MED ORDER — SODIUM CHLORIDE 0.9 % IV SOLN
INTRAVENOUS | Status: DC
  Administered 2013-04-30: 100 mL/h via INTRAVENOUS

## 2013-04-30 MED ORDER — HEPARIN SODIUM (PORCINE) 1000 UNIT/ML IJ SOLN
INTRAMUSCULAR | Status: AC
  Filled 2013-04-30: qty 1

## 2013-04-30 NOTE — CV Procedure (Signed)
    Cardiac Catheterization Procedure Note  Name: Erika Leach MRN: [id number] DOB: [birth year]  Procedure: Left Heart Cath, Selective Coronary Angiography, LV angiography  Indication: 62 yo BF with history of pacemaker, HTN, tobacco abuse, and family history of early CAD presents with symptoms of progressive dyspnea.   Procedural Details: The right wrist was prepped, draped, and anesthetized with 1% lidocaine. Using the modified Seldinger technique, a 5 French sheath was introduced into the right radial artery. 3 mg of verapamil was administered through the sheath, weight-based unfractionated heparin was administered intravenously. Standard Judkins catheters were used for selective coronary angiography and left ventriculography. Catheter exchanges were performed over an exchange length guidewire. There were no immediate procedural complications. A TR band was used for radial hemostasis at the completion of the procedure.  The patient was transferred to the post catheterization recovery area for further monitoring.  Procedural Findings: Hemodynamics: AO 129/71 mean 94 mm Hg LV 130/18 mm Hg  Coronary angiography: Coronary dominance: right  Left mainstem: Normal  Left anterior descending (LAD): Normal  Left circumflex (LCx): Normal  Right coronary artery (RCA): Normal  Left ventriculography: Left ventricular systolic function is normal, LVEF is estimated at 55-65%, there is no significant mitral regurgitation   Final Conclusions:   1. Normal coronary anatomy 2. Normal LV function  Recommendations: Smoking cessation. Will obtain an Echocardiogram as an outpatient.  Theron Arista [facility] 04/30/2013, 1:37 PM

## 2013-04-30 NOTE — Patient Instructions (Addendum)
Your physician has requested that you have a cardiac catheterization TODAY. Cardiac catheterization is used to diagnose and/or treat various heart conditions. Doctors may recommend this procedure for a number of different reasons. The most common reason is to evaluate chest pain. Chest pain can be a symptom of coronary artery disease (CAD), and cardiac catheterization can show whether plaque is narrowing or blocking your heart's arteries. This procedure is also used to evaluate the valves, as well as measure the blood flow and oxygen levels in different parts of your heart. For further information please visit [email or web address] Please follow instruction sheet, as given.  Your physician recommends that you return for lab work in: BMET/CBC TODAY

## 2013-04-30 NOTE — Interval H&P Note (Signed)
History and Physical Interval Note:  04/30/2013 12:58 PM  Erika Leach  has presented today for surgery, with the diagnosis of Chest pain  The various methods of treatment have been discussed with the patient and family. After consideration of risks, benefits and other options for treatment, the patient has consented to  Procedure(s): LEFT HEART CATHETERIZATION WITH CORONARY ANGIOGRAM (N/A) as a surgical intervention .  The patient's history has been reviewed, patient examined, no change in status, stable for surgery.  I have reviewed the patient's chart and labs.  Questions were answered to the patient's satisfaction.   Cath Lab Visit (complete for each Cath Lab visit)  Clinical Evaluation Leading to the Procedure:   ACS: yes  Non-ACS:    Anginal Classification: CCS III  Anti-ischemic medical therapy: Maximal Therapy (2 or more classes of medications)  Non-Invasive Test Results: No non-invasive testing performed  Prior CABG: No previous CABG        Theron Arista [facility] 04/30/2013 12:58 PM

## 2013-04-30 NOTE — H&P (Signed)
Patient ID: Erika Leach  MRN: [id number], DOB/AGE: 03/12/1951  Date of Visit: 04/30/2013  Primary Physician: Quitman Livings, MD  Primary Cardiologist / Primary EP: Antoine Poche, MD / Graciela Husbands, MD  Reason for Visit: EP/device follow-up  History of Present Illness  Erika Leach is a 62 y.o. female with CHB s/p PPM implant Dec 2013, HTN, asthma and tobacco abuse who presents today for routine electrophysiology followup.  Since last being seen in our clinic, she reports increasing DOE x 3 months and significantly so in the last 6 weeks. She describes SOB when walking for long periods of time at [facility] and with walking up stairs. Recently this has progressed to SOB when walking on level ground, accompanied by chest "heaviness" and is occurring daily. She is afraid to leave her home and states, "I won't even walk to my dumpster which is 200 yards away." She reports intermittent palpitations, lasting 1-2 minutes but not accompanying CP or SOB. She denies dizziness, near syncope or syncope. She denies LE swelling, orthopnea, PND or recent weight gain. She is compliant with her medications.  Past Medical History  Past Medical History   Diagnosis  Date   .  Asthma    .  HTN (hypertension)      poorly controlled   .  Complete heart block  06/24/2012   Past Surgical History  Past Surgical History   Procedure  Laterality  Date   .  Tubal ligation     .  Appendectomy     Allergies/Intolerances  No Known Allergies  Current Home Medications  Current Outpatient Prescriptions   Medication  Sig  Dispense  Refill   .  albuterol (PROAIR HFA) 108 (90 BASE) MCG/ACT inhaler  Inhale 2 puffs into the lungs every 6 (six) hours as needed for wheezing.     Marland Kitchen  amLODipine (NORVASC) 10 MG tablet  Take 10 mg by mouth daily.     .  cloNIDine (CATAPRES) 0.1 MG tablet  TAKE ONE TABLET BY MOUTH TWICE DAILY  60 tablet  5   .  diltiazem (CARDIZEM LA) 240 MG 24 hr tablet  Take 1 tablet (240 mg total) by mouth daily.  30 tablet  12   .   lisinopril (PRINIVIL,ZESTRIL) 20 MG tablet  TAKE ONE TABLET BY MOUTH TWICE DAILY  60 tablet  5   .  lovastatin (MEVACOR) 20 MG tablet  Take 20 mg by mouth at bedtime.     .  traMADol (ULTRAM) 50 MG tablet  Take 50 mg by mouth every 6 (six) hours as needed for pain.      No current facility-administered medications for this visit.   Family History  Positive for premature CAD - mother died at age 35 from MI  Social History  Social History   .  Marital Status:  Married    Social History Main Topics   .  Smoking status:  Current Every Day Smoker -- 1.00 packs/day for 40 years     Types:  Cigarettes   .  Smokeless tobacco:  Never Used   .  Alcohol Use:  No      Comment: rare - special occasions   .  Drug Use:  No   Review of Systems  General: No chills, fever, night sweats or weight changes  Cardiovascular: No chest pain, dyspnea on exertion, edema, orthopnea, palpitations, paroxysmal nocturnal dyspnea  Dermatological: No rash, lesions or masses  Respiratory: No cough, dyspnea  Urologic: No hematuria, dysuria  Abdominal: No nausea,  vomiting, diarrhea, bright red blood per rectum, melena, or hematemesis  Neurologic: No visual changes, weakness, changes in mental status  All other systems reviewed and are otherwise negative except as noted above.  Physical Exam  Vitals: Blood pressure 106/76, pulse 75, height 5\' 6"  (1.676 m), weight 192 lb 1.9 oz (87.145 kg).  General: Well developed, well appearing 62 y.o. female in no acute distress.  HEENT: Normocephalic, atraumatic. EOMs intact. Sclera nonicteric. Oropharynx clear.  Neck: Supple without bruits. No JVD.  Lungs: Respirations regular and unlabored, CTA bilaterally. No wheezes, rales or rhonchi.  Heart: RRR. S1, S2 present. No murmurs, rub, S3 or S4.  Abdomen: Soft, non-tender, non-distended. BS present x 4 quadrants. No hepatosplenomegaly.  Extremities: No clubbing, cyanosis or edema. DP/PT/Radials 2+ and equal bilaterally.  Psych:  Normal affect.  Neuro: Alert and oriented X 3. Moves all extremities spontaneously.  Diagnostics  12-lead ECG today - A sense V paced rhythm at 67 bpm  Device interrogation today - Normal PPM function; good battery status with 11.5 years remaining; AP 19% VP 84%; underlying rhythm today is 2:1 AV block; stable sensing, thresholds and impedances; 21 AMS episodes, 1 AHR - 38 seconds in duration, EGMs consistent with AFib; 3 VHR episodes - longest 5 seconds, EGMs consistent with sinus tach / atrial tachycardia; histograms appropriate; no programming changes made; see PaceArt report  Assessment and Plan  1. Chest pain and DOE - worrisome for [place] / ACS  2. Complete heart block s/p PPM implant  3. PAF, new  4. Preserved LV function by echo Dec 2013  5. HTN  6. Tobacco abuse  7. Positive family hx of premature CAD  Erika Leach presents with worsening DOE and chest pain. Her symptoms are concerning for [place] / ACS. Her risk factors include HTN, tobacco abuse and family history of premature CAD. We have recommended definitive evaluation with a right and left cardiac catheterization. The procedure involved, including risks and benefits, was reviewed in detail. These risks include, but are not limited to, death, stroke, MI, bleeding, blood clots, damage to the blood vessels and/or damage to the kidneys. Possible treatment outcomes, should obstructive CAD be found, were reviewed including angioplasty, stent placement or bypass surgery. In addition, we have recommended an echo to assess valvular and LV function post PPM implant. Erika Leach expressed verbal understanding and agrees with this plan of care. This is scheduled for today.  Of note, she had one episode of AFib recorded by device and found on interrogation today. Duration 38 seconds and this is new for her. She has no history of CVA, DM or CHF. Her CHADS2-VASc score is 2 for gender and HTN. At this time, we are planning cardiac cath so will not start  anticoagulation until afterward pending the results and Dr. Odessa Fleming recommendation.  Dr. Graciela Husbands was in to interview and examine the patient with me. Dr. Graciela Husbands agrees with this plan of care.  Signed,  Rick Duff, PA-C  04/30/2013, 8:57 AM   Patient seen and examined and history reviewed. Agree with above findings and plan.  Thedora Hinders 04/30/2013 12:57 PM

## 2013-04-30 NOTE — Progress Notes (Signed)
ELECTROPHYSIOLOGY OFFICE NOTE  Patient ID: Erika Leach MRN: [id number], DOB/AGE: [birth year]   Date of Visit: 04/30/2013  Primary Physician: Quitman Livings, MD Primary Cardiologist / Primary EP: Antoine Poche, MD / Graciela Husbands, MD Reason for Visit: EP/device follow-up  History of Present Illness  Erika Leach is a 62 y.o. female with CHB s/p PPM implant Dec 2013, HTN, asthma and tobacco abuse who presents today for routine electrophysiology followup.   Since last being seen in our clinic, she reports increasing DOE x 3 months and significantly so in the last 6 weeks. She describes SOB when walking for long periods of time at [facility] and with walking up stairs. Recently this has progressed to SOB when walking on level ground, accompanied by chest "heaviness" and is occurring daily. She is afraid to leave her home and states, "I won't even walk to my dumpster which is 200 yards away." She reports intermittent palpitations, lasting 1-2 minutes but not accompanying CP or SOB. She denies dizziness, near syncope or syncope. She denies LE swelling, orthopnea, PND or recent weight gain. She is compliant with her medications.  Past Medical History Past Medical History  Diagnosis Date  . Asthma   . HTN (hypertension)     poorly controlled  . Complete heart block 06/24/2012    Past Surgical History Past Surgical History  Procedure Laterality Date  . Tubal ligation    . Appendectomy      Allergies/Intolerances No Known Allergies  Current Home Medications Current Outpatient Prescriptions  Medication Sig Dispense Refill  . albuterol (PROAIR HFA) 108 (90 BASE) MCG/ACT inhaler Inhale 2 puffs into the lungs every 6 (six) hours as needed for wheezing.      Marland Kitchen amLODipine (NORVASC) 10 MG tablet Take 10 mg by mouth daily.      . cloNIDine (CATAPRES) 0.1 MG tablet TAKE ONE TABLET BY MOUTH TWICE DAILY  60 tablet  5  . diltiazem (CARDIZEM LA) 240 MG 24 hr tablet Take 1 tablet (240 mg total) by mouth daily.  30  tablet  12  . lisinopril (PRINIVIL,ZESTRIL) 20 MG tablet TAKE ONE TABLET BY MOUTH TWICE DAILY  60 tablet  5  . lovastatin (MEVACOR) 20 MG tablet Take 20 mg by mouth at bedtime.      . traMADol (ULTRAM) 50 MG tablet Take 50 mg by mouth every 6 (six) hours as needed for pain.       No current facility-administered medications for this visit.    Family History Positive for premature CAD - mother died at age 8 from MI  Social History Social History  . Marital Status: Married   Social History Main Topics  . Smoking status: Current Every Day Smoker -- 1.00 packs/day for 40 years    Types: Cigarettes  . Smokeless tobacco: Never Used  . Alcohol Use: No     Comment: rare - special occasions  . Drug Use: No   Review of Systems General: No chills, fever, night sweats or weight changes Cardiovascular: No chest pain, dyspnea on exertion, edema, orthopnea, palpitations, paroxysmal nocturnal dyspnea Dermatological: No rash, lesions or masses Respiratory: No cough, dyspnea Urologic: No hematuria, dysuria Abdominal: No nausea, vomiting, diarrhea, bright red blood per rectum, melena, or hematemesis Neurologic: No visual changes, weakness, changes in mental status All other systems reviewed and are otherwise negative except as noted above.  Physical Exam Vitals: Blood pressure 106/76, pulse 75, height 5\' 6"  (1.676 m), weight 192 lb 1.9 oz (87.145 kg).  General: Well developed, well appearing 62  y.o. female in no acute distress. HEENT: Normocephalic, atraumatic. EOMs intact. Sclera nonicteric. Oropharynx clear.  Neck: Supple without bruits. No JVD. Lungs: Respirations regular and unlabored, CTA bilaterally. No wheezes, rales or rhonchi. Heart: RRR. S1, S2 present. No murmurs, rub, S3 or S4. Abdomen: Soft, non-tender, non-distended. BS present x 4 quadrants. No hepatosplenomegaly.  Extremities: No clubbing, cyanosis or edema. DP/PT/Radials 2+ and equal bilaterally. Psych: Normal affect. Neuro:  Alert and oriented X 3. Moves all extremities spontaneously.   Diagnostics 12-lead ECG today - A sense V paced rhythm at 67 bpm Device interrogation today - Normal PPM function; good battery status with 11.5 years remaining; AP 19% VP 84%; underlying rhythm today is 2:1 AV block; stable sensing, thresholds and impedances; 21 AMS episodes, 1 AHR - 38 seconds in duration, EGMs consistent with AFib; 3 VHR episodes - longest 5 seconds, EGMs consistent with sinus tach / atrial tachycardia; histograms appropriate; no programming changes made; see PaceArt report  Assessment and Plan 1. Chest pain and DOE - worrisome for [place] / ACS 2. Complete heart block s/p PPM implant 3. PAF, new 4. Preserved LV function by echo Dec 2013 5. HTN 6. Tobacco abuse 7. Positive family hx of premature CAD  [name] presents with worsening DOE and chest pain. Her symptoms are concerning for [place] / ACS. Her risk factors include HTN, tobacco abuse and family history of premature CAD. We have recommended definitive evaluation with a right and left cardiac catheterization. The procedure involved, including risks and benefits, was reviewed in detail. These risks include, but are not limited to, death, stroke, MI, bleeding, blood clots, damage to the blood vessels and/or damage to the kidneys. Possible treatment outcomes, should obstructive CAD be found, were reviewed including angioplasty, stent placement or bypass surgery. In addition, we have recommended an echo to assess valvular and LV function post PPM implant. [name] expressed verbal understanding and agrees with this plan of care. This is scheduled for today.   Of note, she had one episode of AFib recorded by device and found on interrogation today. Duration 38 seconds and this is new for her. She has no history of CVA, DM or CHF. Her CHADS2-VASc score is 2 for gender and HTN. At this time, we are planning cardiac cath so will not start anticoagulation until afterward  pending the results and Dr. Odessa Fleming recommendation.  Dr. Graciela Husbands was in to interview and examine the patient with me. Dr. Graciela Husbands agrees with this plan of care. Signed, Rick Duff, PA-C 04/30/2013, 8:57 AM

## 2013-05-07 ENCOUNTER — Telehealth: Payer: Self-pay | Admitting: Internal Medicine

## 2013-05-07 NOTE — Telephone Encounter (Signed)
Let patient know that I would review this with Dr. Graciela Husbands next week when he is back in office, and then contact her with his recommendations. Patient agreeable to plan.

## 2013-05-07 NOTE — Telephone Encounter (Signed)
New message    Had cath last week---will you be doing more procedures on pt?

## 2013-05-14 ENCOUNTER — Other Ambulatory Visit: Payer: Self-pay | Admitting: *Deleted

## 2013-05-14 DIAGNOSIS — I10 Essential (primary) hypertension: Secondary | ICD-10-CM

## 2013-05-14 MED ORDER — LISINOPRIL-HYDROCHLOROTHIAZIDE 20-12.5 MG PO TABS: 1.0000 | ORAL_TABLET | Freq: Every day | ORAL | Status: DC

## 2013-05-14 NOTE — Telephone Encounter (Signed)
Dr.  Graciela Husbands recommends stopping Lisinopril and starting Prinzide 20/12.5mg  daily. I sent prescription in. Follow up BMET scehduled for 11/21. Patient verbalized understanding and agreeable to plan.

## 2013-05-28 ENCOUNTER — Other Ambulatory Visit (INDEPENDENT_AMBULATORY_CARE_PROVIDER_SITE_OTHER): Payer: Medicaid Other

## 2013-05-28 DIAGNOSIS — I10 Essential (primary) hypertension: Secondary | ICD-10-CM

## 2013-05-28 LAB — BASIC METABOLIC PANEL
BUN: 13 mg/dL (ref 6–23)
CO2: 25 mEq/L (ref 19–32)
Calcium: 10 mg/dL (ref 8.4–10.5)
Chloride: 102 mEq/L (ref 96–112)
Creatinine, Ser: 0.9 mg/dL (ref 0.4–1.2)
GFR: 81.4 mL/min (ref >60.00)
Glucose, Bld: 136 mg/dL — ABNORMAL HIGH (ref 70–99)
Potassium: 3.9 mEq/L (ref 3.5–5.1)
Sodium: 136 mEq/L (ref 135–145)

## 2013-06-04 ENCOUNTER — Telehealth: Payer: Self-pay | Admitting: Internal Medicine

## 2013-06-04 NOTE — Telephone Encounter (Signed)
Follow up    Pt returning call for her test results.  Please give her a call back

## 2013-06-04 NOTE — Telephone Encounter (Signed)
Patient informed. 

## 2013-07-09 ENCOUNTER — Ambulatory Visit (INDEPENDENT_AMBULATORY_CARE_PROVIDER_SITE_OTHER): Payer: Medicaid Other | Admitting: Internal Medicine

## 2013-07-09 ENCOUNTER — Encounter: Payer: Self-pay | Admitting: Internal Medicine

## 2013-07-09 VITALS — BP 118/68 | HR 76 | Ht 66.0 in | Wt 194.0 lb

## 2013-07-09 DIAGNOSIS — R0609 Other forms of dyspnea: Secondary | ICD-10-CM | POA: Insufficient documentation

## 2013-07-09 DIAGNOSIS — R0600 Dyspnea, unspecified: Secondary | ICD-10-CM | POA: Insufficient documentation

## 2013-07-09 DIAGNOSIS — I442 Atrioventricular block, complete: Secondary | ICD-10-CM

## 2013-07-09 DIAGNOSIS — Z950 Presence of cardiac pacemaker: Secondary | ICD-10-CM

## 2013-07-09 DIAGNOSIS — R0989 Other specified symptoms and signs involving the circulatory and respiratory systems: Secondary | ICD-10-CM

## 2013-07-09 LAB — MDC_IDC_ENUM_SESS_TYPE_INCLINIC
Battery Impedance: 133 Ohm
Battery Remaining Longevity: 127 mo
Battery Voltage: 2.79 V
Brady Statistic AP VP Percent: 23 %
Brady Statistic AP VS Percent: 0 %
Brady Statistic AS VP Percent: 76 %
Brady Statistic AS VS Percent: 0 %
Date Time Interrogation Session: 20150102094637
Lead Channel Impedance Value: 506 Ohm
Lead Channel Impedance Value: 540 Ohm
Lead Channel Pacing Threshold Amplitude: 0.5 V
Lead Channel Pacing Threshold Amplitude: 0.75 V
Lead Channel Pacing Threshold Pulse Width: 0.4 ms
Lead Channel Pacing Threshold Pulse Width: 0.4 ms
Lead Channel Sensing Intrinsic Amplitude: 2 mV
Lead Channel Sensing Intrinsic Amplitude: 4 mV
Lead Channel Setting Pacing Amplitude: 2 V
Lead Channel Setting Pacing Amplitude: 2.5 V
Lead Channel Setting Pacing Pulse Width: 0.4 ms
Lead Channel Setting Sensing Sensitivity: 2 mV

## 2013-07-09 MED ORDER — LISINOPRIL-HYDROCHLOROTHIAZIDE 20-12.5 MG PO TABS: 2.0000 | ORAL_TABLET | Freq: Every day | ORAL | Status: DC

## 2013-07-09 NOTE — Progress Notes (Signed)
      Patient Care Team: Antonietta Jewel as PCP - General (Internal Medicine)   HPI  Erika Leach is a 63 y.o. female  Seen in followup for PM implanted for CHB 12/13 after presenting with a fall  She continues to complain of exercise associated shortness of breath. She does not have resting dyspnea or orthopnea or PND. She's had a little bit of peripheral edema. Is her recollection that amlodipine is newesst medication and maybe temporally associated with her dyspnea  intercurretnly seen for chest pain CAth>> normal LV function and arteries. Notably however her LVEDP was 18. Past Medical History  Diagnosis Date  . Asthma   . HTN (hypertension)     poorly controlled  . Complete heart block 06/24/2012  . 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  . Chest pain 2014    Cath 10/14>>normal CA    Past Surgical History  Procedure Laterality Date  . Tubal ligation    . Appendectomy      Current Outpatient Prescriptions  Medication Sig Dispense Refill  . albuterol (PROAIR HFA) 108 (90 BASE) MCG/ACT inhaler Inhale 2 puffs into the lungs every 6 (six) hours as needed for wheezing or shortness of breath.       Marland Kitchen amLODipine (NORVASC) 10 MG tablet Take 10 mg by mouth daily.      . beclomethasone (QVAR) 80 MCG/ACT inhaler Inhale 2 puffs into the lungs 2 (two) times daily.      . cloNIDine (CATAPRES) 0.1 MG tablet Take 0.1 mg by mouth 2 (two) times daily.      Marland Kitchen diltiazem (CARDIZEM LA) 240 MG 24 hr tablet Take 240 mg by mouth daily.      Marland Kitchen lisinopril-hydrochlorothiazide (PRINZIDE) 20-12.5 MG per tablet Take 1 tablet by mouth daily.  30 tablet  6  . loratadine (CLARITIN) 10 MG tablet Take 10 mg by mouth daily.      Marland Kitchen lovastatin (MEVACOR) 20 MG tablet Take 20 mg by mouth at bedtime.      . traMADol (ULTRAM) 50 MG tablet Take 50-100 mg by mouth daily as needed for pain.        No current facility-administered medications for this visit.    No Known Allergies  Review of Systems negative except from  HPI and PMH  Physical Exam BP 118/68  Pulse 76  Ht 5\' 6"  (1.676 m)  Wt 194 lb (87.998 kg)  BMI 31.33 kg/m2  SpO2 94% Well developed and well nourished in no acute distress HENT normal E scleral and icterus clear Neck Supple JVP flat; carotids brisk and full Clear to ausculation Regular rate and rhythm, no murmurs gallops or rub Soft with active bowel sounds No clubbing cyanosis none Edema Alert and oriented, grossly normal motor and sensory function Skin Warm and Dry    Assessment and  Plan

## 2013-07-09 NOTE — Patient Instructions (Addendum)
Your physician has recommended you make the following change in your medication:  1) Stop Amlodipine 2) Increase Lisinopril-hydrochlorothiazide to 40/25 daily  (2 tablets daily)  Your physician has requested that you have a stress echocardiogram. For further information please visit [email or web address]. Please follow instruction sheet as given.  Your physician wants you to follow-up in: 6 months with device clinic.  You will receive a reminder letter in the mail two months in advance. If you don't receive a letter, please call our office to schedule the follow-up appointment.  Your physician wants you to follow-up in: 1 year with Dr. Caryl Comes.  You will receive a reminder letter in the mail two months in advance. If you don't receive a letter, please call our office to schedule the follow-up appointment.

## 2013-07-09 NOTE — Assessment & Plan Note (Signed)
Very well controlled.As above

## 2013-07-09 NOTE — Assessment & Plan Note (Signed)
The patient's device was interrogated.  The information was reviewed. No changes were made in the programming.    

## 2013-07-09 NOTE — Assessment & Plan Note (Signed)
The patient has dyspnea on exertion with an elevated LVEDP consistent with a preliminary diagnosis of HFpEF. We will increase her lisinopril HCT for his diuretic benefits and decrease her amlodipine as she think the latter is temporally associated with worsening dyspnea.  LV function was normal a catheterization; one potential pacemaker complication would be with a significant fraction exercise associated mitral regurgitation so will undertake a stress echo specifically looking for that. She is undergoing pulmonary function testing will await those results. Heart rate excursion seems adequate for chronotropic competence should not be the issue

## 2013-07-09 NOTE — Assessment & Plan Note (Signed)
stable °

## 2013-07-23 ENCOUNTER — Encounter: Payer: Self-pay | Admitting: Cardiovascular Disease

## 2013-07-23 ENCOUNTER — Ambulatory Visit (HOSPITAL_COMMUNITY): Payer: Medicaid Other | Attending: Cardiovascular Disease

## 2013-07-23 DIAGNOSIS — J45909 Unspecified asthma, uncomplicated: Secondary | ICD-10-CM | POA: Insufficient documentation

## 2013-07-23 DIAGNOSIS — I442 Atrioventricular block, complete: Secondary | ICD-10-CM | POA: Insufficient documentation

## 2013-07-23 DIAGNOSIS — I4949 Other premature depolarization: Secondary | ICD-10-CM | POA: Insufficient documentation

## 2013-07-23 DIAGNOSIS — R0989 Other specified symptoms and signs involving the circulatory and respiratory systems: Secondary | ICD-10-CM | POA: Insufficient documentation

## 2013-07-23 DIAGNOSIS — R0600 Dyspnea, unspecified: Secondary | ICD-10-CM

## 2013-07-23 DIAGNOSIS — R0602 Shortness of breath: Secondary | ICD-10-CM

## 2013-07-23 DIAGNOSIS — R0609 Other forms of dyspnea: Secondary | ICD-10-CM | POA: Insufficient documentation

## 2013-07-23 DIAGNOSIS — R42 Dizziness and giddiness: Secondary | ICD-10-CM | POA: Insufficient documentation

## 2013-07-23 DIAGNOSIS — I10 Essential (primary) hypertension: Secondary | ICD-10-CM | POA: Insufficient documentation

## 2013-07-23 DIAGNOSIS — E669 Obesity, unspecified: Secondary | ICD-10-CM | POA: Insufficient documentation

## 2013-07-23 DIAGNOSIS — R002 Palpitations: Secondary | ICD-10-CM | POA: Insufficient documentation

## 2013-07-23 DIAGNOSIS — I491 Atrial premature depolarization: Secondary | ICD-10-CM | POA: Insufficient documentation

## 2013-07-23 NOTE — Progress Notes (Signed)
Stress Echocardiogram performed.  

## 2013-09-14 ENCOUNTER — Encounter: Payer: Self-pay | Admitting: Internal Medicine

## 2013-09-14 ENCOUNTER — Ambulatory Visit (INDEPENDENT_AMBULATORY_CARE_PROVIDER_SITE_OTHER): Payer: Medicaid Other | Admitting: Internal Medicine

## 2013-09-14 VITALS — BP 129/84 | HR 72 | Ht 66.0 in | Wt 194.2 lb

## 2013-09-14 DIAGNOSIS — I442 Atrioventricular block, complete: Secondary | ICD-10-CM

## 2013-09-14 LAB — MDC_IDC_ENUM_SESS_TYPE_INCLINIC
Battery Impedance: 110 Ohm
Battery Remaining Longevity: 133 mo
Battery Voltage: 2.8 V
Brady Statistic AP VP Percent: 22 %
Brady Statistic AP VS Percent: 0 %
Brady Statistic AS VP Percent: 73 %
Brady Statistic AS VS Percent: 5 %
Date Time Interrogation Session: 20150310104349
Lead Channel Impedance Value: 486 Ohm
Lead Channel Impedance Value: 532 Ohm
Lead Channel Pacing Threshold Amplitude: 0.5 V
Lead Channel Pacing Threshold Amplitude: 0.75 V
Lead Channel Pacing Threshold Pulse Width: 0.4 ms
Lead Channel Pacing Threshold Pulse Width: 0.4 ms
Lead Channel Sensing Intrinsic Amplitude: 2.8 mV
Lead Channel Sensing Intrinsic Amplitude: 2.8 mV
Lead Channel Setting Pacing Amplitude: 2 V
Lead Channel Setting Pacing Amplitude: 2.5 V
Lead Channel Setting Pacing Pulse Width: 0.4 ms
Lead Channel Setting Sensing Sensitivity: 2 mV

## 2013-09-14 MED ORDER — LISINOPRIL-HYDROCHLOROTHIAZIDE 20-12.5 MG PO TABS: 1.0000 | ORAL_TABLET | Freq: Every day | ORAL | Status: DC

## 2013-09-14 NOTE — Patient Instructions (Signed)

## 2013-09-14 NOTE — Progress Notes (Signed)
      Patient Care Team: Antonietta Jewel as PCP - General (Internal Medicine)   HPI  Erika Leach is a 63 y.o. female Seen in followup for PM implanted for CHB 12/13 after presenting with a fall  She continues to complain of exercise associated shortness of breath.  Little bit of edema.  At her last visit we increased her diuretics and decreased her amlodipine. Stress echo demonstrated no worsening of mitral regurgitation   intercurretnly seen for chest pain CAth>> normal LV function and arteries. Notably however her LVEDP was 18.  Her breathing is better. She feels better also amlodipine. She also is working on losing weight.  Her target is 20 pounds. She snores and has daytime somnolence.   Past Medical History  Diagnosis Date  . Asthma   . HTN (hypertension)     poorly controlled  . Complete heart block 06/24/2012  . 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  . Chest pain 2014    Cath 10/14>>normal CA    Past Surgical History  Procedure Laterality Date  . Tubal ligation    . Appendectomy      Current Outpatient Prescriptions  Medication Sig Dispense Refill  . albuterol (PROAIR HFA) 108 (90 BASE) MCG/ACT inhaler Inhale 2 puffs into the lungs every 6 (six) hours as needed for wheezing or shortness of breath.       . beclomethasone (QVAR) 80 MCG/ACT inhaler Inhale 2 puffs into the lungs 2 (two) times daily.      . cloNIDine (CATAPRES) 0.1 MG tablet Take 0.1 mg by mouth 2 (two) times daily.      Marland Kitchen diltiazem (CARDIZEM LA) 240 MG 24 hr tablet Take 240 mg by mouth daily.      Marland Kitchen lisinopril-hydrochlorothiazide (PRINZIDE) 20-12.5 MG per tablet Take 2 tablets by mouth daily.  180 tablet  3  . loratadine (CLARITIN) 10 MG tablet Take 10 mg by mouth daily.      Marland Kitchen lovastatin (MEVACOR) 20 MG tablet Take 20 mg by mouth at bedtime.      . traMADol (ULTRAM) 50 MG tablet Take 50-100 mg by mouth daily as needed for pain.        No current facility-administered medications for this visit.    No  Known Allergies  Review of Systems negative except from HPI and PMH  Physical Exam BP 129/84  Pulse 72  Ht 5\' 6"  (1.676 m)  Wt 194 lb 4 oz (88.111 kg)  BMI 31.37 kg/m2 Well developed and well nourished in no acute distress HENT normal E scleral and icterus clear Neck Supple JVP6-8; carotids brisk and full Clear to ausculation  Regular rate and rhythm, no murmurs gallops or rub Soft with active bowel sounds No clubbing cyanosis none Edema Alert and oriented, grossly normal motor and sensory function Skin Warm and Dry  ECG demonstrates P. synchronous pacing  Assessment and  Plan  Complete heart block  Pacemaker-[employer] The patient's device was interrogated.  The information was reviewed. No changes were made in the programming.    Dyspnea on exertion/HFpEF  Hypertension  Obstructive sleep apnea  Obesity  She is euvolemic. Her blood pressure is well-controlled. She had some dizziness  On 40/25 continue her on 20/12.5  His original weight loss. I think is appropriate to defer a sleep study and she is currently highly motivated to lose weight. I should note also that she has stopped smoking.Marland Kitchen

## 2013-09-21 ENCOUNTER — Encounter: Payer: Self-pay | Admitting: Internal Medicine

## 2013-10-11 ENCOUNTER — Encounter: Payer: Self-pay | Admitting: *Deleted

## 2013-10-11 ENCOUNTER — Telehealth: Payer: Self-pay | Admitting: Internal Medicine

## 2013-10-11 NOTE — Telephone Encounter (Signed)
Advised that clearance faxed today.

## 2013-10-11 NOTE — Telephone Encounter (Signed)
New message     Have not received clearance for oral surgery on the pt.  They faxed the forms on 3-24.  Please fax clearance to [phone].

## 2013-10-13 ENCOUNTER — Other Ambulatory Visit: Payer: Self-pay

## 2013-10-13 DIAGNOSIS — Z1231 Encounter for screening mammogram for malignant neoplasm of breast: Secondary | ICD-10-CM

## 2013-10-13 DIAGNOSIS — Z803 Family history of malignant neoplasm of breast: Secondary | ICD-10-CM

## 2013-11-15 ENCOUNTER — Ambulatory Visit
Admission: RE | Admit: 2013-11-15 | Discharge: 2013-11-15 | Disposition: A | Discharge status: Home / Self Care | Payer: Medicaid Other | Source: Ambulatory Visit

## 2013-11-15 ENCOUNTER — Encounter (INDEPENDENT_AMBULATORY_CARE_PROVIDER_SITE_OTHER): Payer: Self-pay

## 2013-11-15 DIAGNOSIS — Z803 Family history of malignant neoplasm of breast: Secondary | ICD-10-CM

## 2013-11-15 DIAGNOSIS — Z1231 Encounter for screening mammogram for malignant neoplasm of breast: Secondary | ICD-10-CM

## 2013-11-16 ENCOUNTER — Other Ambulatory Visit: Payer: Self-pay | Admitting: Internal Medicine

## 2013-11-16 DIAGNOSIS — R928 Other abnormal and inconclusive findings on diagnostic imaging of breast: Secondary | ICD-10-CM

## 2013-11-26 ENCOUNTER — Ambulatory Visit
Admission: RE | Admit: 2013-11-26 | Discharge: 2013-11-26 | Disposition: A | Discharge status: Home / Self Care | Payer: Medicaid Other | Source: Ambulatory Visit | Attending: Internal Medicine | Admitting: Internal Medicine

## 2013-11-26 DIAGNOSIS — R928 Other abnormal and inconclusive findings on diagnostic imaging of breast: Secondary | ICD-10-CM

## 2013-12-16 ENCOUNTER — Telehealth: Payer: Self-pay | Admitting: Internal Medicine

## 2013-12-16 ENCOUNTER — Telehealth: Payer: Self-pay | Admitting: Cardiology

## 2013-12-16 ENCOUNTER — Ambulatory Visit (INDEPENDENT_AMBULATORY_CARE_PROVIDER_SITE_OTHER): Payer: Medicaid Other | Admitting: *Deleted

## 2013-12-16 DIAGNOSIS — I442 Atrioventricular block, complete: Secondary | ICD-10-CM

## 2013-12-16 NOTE — Telephone Encounter (Signed)
New message ° ° ° ° ° °Did we get her remote transmission? °

## 2013-12-16 NOTE — Telephone Encounter (Signed)
LMOVM reminding pt to send remote transmission.   

## 2013-12-16 NOTE — Telephone Encounter (Signed)
Pt stated when she attempted to send transmission that the machine started beeping and shut off half way through transmission. I gave her customer support number to [employer] for her to call and receive help on trouble shooting her monitor.

## 2013-12-17 ENCOUNTER — Encounter: Payer: Self-pay | Admitting: Cardiology

## 2013-12-17 DIAGNOSIS — I442 Atrioventricular block, complete: Secondary | ICD-10-CM

## 2013-12-17 NOTE — Progress Notes (Signed)
Remote pacemaker transmission.   

## 2013-12-28 LAB — MDC_IDC_ENUM_SESS_TYPE_REMOTE
Battery Impedance: 157 Ohm
Battery Remaining Longevity: 122 mo
Battery Voltage: 2.79 V
Brady Statistic AP VP Percent: 35 %
Brady Statistic AP VS Percent: 1 %
Brady Statistic AS VP Percent: 59 %
Brady Statistic AS VS Percent: 6 %
Date Time Interrogation Session: 20150612182346
Lead Channel Impedance Value: 517 Ohm
Lead Channel Impedance Value: 532 Ohm
Lead Channel Pacing Threshold Amplitude: 0.5 V
Lead Channel Pacing Threshold Amplitude: 0.625 V
Lead Channel Pacing Threshold Pulse Width: 0.4 ms
Lead Channel Pacing Threshold Pulse Width: 0.4 ms
Lead Channel Sensing Intrinsic Amplitude: 2.8 mV
Lead Channel Setting Pacing Amplitude: 2 V
Lead Channel Setting Pacing Amplitude: 2.5 V
Lead Channel Setting Pacing Pulse Width: 0.4 ms
Lead Channel Setting Sensing Sensitivity: 2 mV

## 2013-12-30 ENCOUNTER — Other Ambulatory Visit: Payer: Self-pay | Admitting: Internal Medicine

## 2014-01-11 ENCOUNTER — Encounter: Payer: Self-pay | Admitting: Cardiology

## 2014-01-18 ENCOUNTER — Encounter: Payer: Self-pay | Admitting: Internal Medicine

## 2014-03-21 ENCOUNTER — Encounter: Payer: Self-pay | Admitting: Internal Medicine

## 2014-03-21 ENCOUNTER — Ambulatory Visit (INDEPENDENT_AMBULATORY_CARE_PROVIDER_SITE_OTHER): Payer: Medicaid Other | Admitting: *Deleted

## 2014-03-21 DIAGNOSIS — I442 Atrioventricular block, complete: Secondary | ICD-10-CM

## 2014-03-21 NOTE — Progress Notes (Signed)
Remote pacemaker transmission.   

## 2014-03-25 LAB — MDC_IDC_ENUM_SESS_TYPE_REMOTE
Battery Impedance: 157 Ohm
Battery Remaining Longevity: 122 mo
Battery Voltage: 2.79 V
Brady Statistic AP VP Percent: 36 %
Brady Statistic AP VS Percent: 1 %
Brady Statistic AS VP Percent: 54 %
Brady Statistic AS VS Percent: 10 %
Date Time Interrogation Session: 20150914151129
Lead Channel Impedance Value: 502 Ohm
Lead Channel Impedance Value: 510 Ohm
Lead Channel Pacing Threshold Amplitude: 0.625 V
Lead Channel Pacing Threshold Amplitude: 0.75 V
Lead Channel Pacing Threshold Pulse Width: 0.4 ms
Lead Channel Pacing Threshold Pulse Width: 0.4 ms
Lead Channel Sensing Intrinsic Amplitude: 2.8 mV
Lead Channel Setting Pacing Amplitude: 2 V
Lead Channel Setting Pacing Amplitude: 2.5 V
Lead Channel Setting Pacing Pulse Width: 0.4 ms
Lead Channel Setting Sensing Sensitivity: 2 mV

## 2014-04-11 ENCOUNTER — Encounter: Payer: Self-pay | Admitting: Cardiology

## 2014-05-25 ENCOUNTER — Other Ambulatory Visit (HOSPITAL_COMMUNITY): Payer: Self-pay | Admitting: Internal Medicine

## 2014-05-25 ENCOUNTER — Ambulatory Visit (HOSPITAL_COMMUNITY)
Admission: RE | Admit: 2014-05-25 | Discharge: 2014-05-25 | Disposition: A | Discharge status: Home / Self Care | Payer: Medicaid Other | Source: Ambulatory Visit | Attending: Internal Medicine | Admitting: Internal Medicine

## 2014-05-25 DIAGNOSIS — Z87891 Personal history of nicotine dependence: Secondary | ICD-10-CM | POA: Diagnosis not present

## 2014-05-25 DIAGNOSIS — R059 Cough, unspecified: Secondary | ICD-10-CM

## 2014-05-25 DIAGNOSIS — R05 Cough: Secondary | ICD-10-CM

## 2014-05-25 DIAGNOSIS — I10 Essential (primary) hypertension: Secondary | ICD-10-CM | POA: Diagnosis present

## 2014-05-25 DIAGNOSIS — J449 Chronic obstructive pulmonary disease, unspecified: Secondary | ICD-10-CM | POA: Insufficient documentation

## 2014-05-25 DIAGNOSIS — I459 Conduction disorder, unspecified: Secondary | ICD-10-CM | POA: Insufficient documentation

## 2014-05-25 DIAGNOSIS — R6889 Other general symptoms and signs: Secondary | ICD-10-CM | POA: Diagnosis present

## 2014-06-16 ENCOUNTER — Encounter (HOSPITAL_COMMUNITY): Payer: Self-pay | Admitting: Internal Medicine

## 2014-06-21 ENCOUNTER — Telehealth: Payer: Self-pay | Admitting: Cardiology

## 2014-06-21 ENCOUNTER — Encounter: Payer: Medicaid Other | Admitting: *Deleted

## 2014-06-21 NOTE — Telephone Encounter (Signed)
Spoke with pt and reminded pt of remote transmission that is due today. Pt verbalized understanding.   

## 2014-06-24 ENCOUNTER — Encounter: Payer: Self-pay | Admitting: Cardiology

## 2014-07-06 ENCOUNTER — Other Ambulatory Visit: Payer: Self-pay | Admitting: Internal Medicine

## 2014-07-06 MED ORDER — CARDIZEM LA 240 MG PO TB24: 240.0000 mg | ORAL_TABLET | Freq: Every day | ORAL | Status: DC

## 2014-09-20 ENCOUNTER — Ambulatory Visit (INDEPENDENT_AMBULATORY_CARE_PROVIDER_SITE_OTHER): Payer: Medicaid Other | Admitting: Internal Medicine

## 2014-09-20 ENCOUNTER — Encounter: Payer: Self-pay | Admitting: Internal Medicine

## 2014-09-20 VITALS — BP 118/74 | HR 96 | Ht 66.0 in | Wt 194.4 lb

## 2014-09-20 DIAGNOSIS — R0683 Snoring: Secondary | ICD-10-CM

## 2014-09-20 DIAGNOSIS — Z45018 Encounter for adjustment and management of other part of cardiac pacemaker: Secondary | ICD-10-CM

## 2014-09-20 DIAGNOSIS — R5383 Other fatigue: Secondary | ICD-10-CM

## 2014-09-20 DIAGNOSIS — G478 Other sleep disorders: Secondary | ICD-10-CM

## 2014-09-20 DIAGNOSIS — I442 Atrioventricular block, complete: Secondary | ICD-10-CM

## 2014-09-20 DIAGNOSIS — G4730 Sleep apnea, unspecified: Secondary | ICD-10-CM

## 2014-09-20 DIAGNOSIS — Z79899 Other long term (current) drug therapy: Secondary | ICD-10-CM

## 2014-09-20 NOTE — Progress Notes (Signed)
Patient Care Team: Antonietta Jewel, MD as PCP - General (Internal Medicine)   HPI  Erika Leach is a 64 y.o. female Seen in followup for PM implanted for CHB 12/13 after presenting with a fall   She continues to complain of exercise associated shortness of breath.  This is accompanied by chest pressure.   She is also no struggling with shortness of breath at rest. Little bit of edema.  10/14 Cath>> normal LV function and arteries. Notably however her LVEDP was 18.  Stress echo 1/15 was negative. There is also no exercise associated MR   LV function at that time remained at the lower limits of normal.   She also is working on losing weight.   She snores and has daytime somnolence.   Past Medical History  Diagnosis Date  . Asthma   . HTN (hypertension)     poorly controlled  . Complete heart block 06/24/2012  . 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  . Chest pain 2014    Cath 10/14>>normal CA    Past Surgical History  Procedure Laterality Date  . Tubal ligation    . Appendectomy    . Permanent pacemaker insertion N/A 06/24/2012    Procedure: PERMANENT PACEMAKER INSERTION;  Surgeon: Deboraha Sprang, MD;  Location: [facility] CATH LAB;  Service: Cardiovascular;  Laterality: N/A;  . Left heart catheterization with coronary angiogram N/A 04/30/2013    Procedure: LEFT HEART CATHETERIZATION WITH CORONARY ANGIOGRAM;  Surgeon: Peter M Martinique, MD;  Location: [facility] CATH LAB;  Service: Cardiovascular;  Laterality: N/A;    Current Outpatient Prescriptions  Medication Sig Dispense Refill  . albuterol (PROAIR HFA) 108 (90 BASE) MCG/ACT inhaler Inhale 2 puffs into the lungs every 6 (six) hours as needed for wheezing or shortness of breath.     . beclomethasone (QVAR) 80 MCG/ACT inhaler Inhale 2 puffs into the lungs 2 (two) times daily.    Marland Kitchen CARDIZEM LA 240 MG 24 hr tablet Take 1 tablet (240 mg total) by mouth daily. 90 tablet 1  . cloNIDine (CATAPRES) 0.1 MG tablet Take 0.1 mg by mouth 2 (two) times daily.     Marland Kitchen lisinopril-hydrochlorothiazide (PRINZIDE) 20-12.5 MG per tablet Take 1 tablet by mouth daily. 90 tablet 3  . loratadine (CLARITIN) 10 MG tablet Take 10 mg by mouth daily.    Marland Kitchen lovastatin (MEVACOR) 20 MG tablet Take 20 mg by mouth at bedtime.    . traMADol (ULTRAM) 50 MG tablet Take 50-100 mg by mouth daily as needed for pain.      No current facility-administered medications for this visit.    No Known Allergies  Review of Systems negative except from HPI and PMH  Physical Exam BP 118/74 mmHg  Pulse 96  Ht 5\' 6"  (1.676 m)  Wt 194 lb 6.4 oz (88.179 kg)  BMI 31.39 kg/m2 Well developed and well nourished in no acute distress HENT normal E scleral and icterus clear Neck Supple JVP6-8; carotids brisk and full Clear to ausculation  Regular rate and rhythm, no murmurs gallops or rub Soft with active bowel sounds No clubbing cyanosis none Edema Alert and oriented, grossly normal motor and sensory function Skin Warm and Dry  ECG demonstrates P. synchronous pacing  Assessment and  Plan  Complete heart block  Pacemaker-[employer] The patient's device was interrogated.  The information was reviewed. No changes were made in the programming.    Dyspnea on exertion/HFpEF  Hypertension  Well-controlled  Obstructive sleep apnea  Obesity  She  is euvolemic. Her blood pressure is well-controlled.   she has been unsuccessful in losing weight.    We will plan to undertake a sleep study.   We will begin her on a low-dose diuretic.   We'll check a BNP and metabolic profile in 2 weeks time.   I will have her hold her diltiazem for 2 weeks to see if it is contributing to her fatigue.   We'll undertake pulmonary function testing.

## 2014-09-20 NOTE — Patient Instructions (Addendum)
Your physician has recommended you make the following change in your medication:  1) Hold Diltiazem for 2 weeks to see if you feel better  Your physician recommends that you return for lab work in: 2 weeks for BMET & BNP  Your physician has recommended that you have a sleep study. This test records several body functions during sleep, including: brain activity, eye movement, oxygen and carbon dioxide blood levels, heart rate and rhythm, breathing rate and rhythm, the flow of air through your mouth and nose, snoring, body muscle movements, and chest and belly movement.  Your physician has recommended that you have a pulmonary function test. Pulmonary Function Tests are a group of tests that measure how well air moves in and out of your lungs.  Remote monitoring is used to monitor your pacemaker from home. This monitoring reduces the number of office visits required to check your device to one time per year. It allows [place] to keep an eye on the functioning of your device to ensure it is working properly. You are scheduled for a device check from home on 12/22/2014. You may send your transmission at any time that day. If you have a wireless device, the transmission will be sent automatically. After your physician reviews your transmission, you will receive a postcard with your next transmission date.  Your physician recommends that you schedule a follow-up appointment in: 12 months with Dr.Klein

## 2014-10-04 ENCOUNTER — Other Ambulatory Visit: Payer: Self-pay | Admitting: Internal Medicine

## 2014-10-04 ENCOUNTER — Other Ambulatory Visit (INDEPENDENT_AMBULATORY_CARE_PROVIDER_SITE_OTHER): Payer: Medicaid Other | Admitting: *Deleted

## 2014-10-04 DIAGNOSIS — Z79899 Other long term (current) drug therapy: Secondary | ICD-10-CM | POA: Diagnosis not present

## 2014-10-04 LAB — BASIC METABOLIC PANEL
BUN: 20 mg/dL (ref 6–23)
CO2: 27 mEq/L (ref 19–32)
Calcium: 10 mg/dL (ref 8.4–10.5)
Chloride: 102 mEq/L (ref 96–112)
Creatinine, Ser: 1.07 mg/dL (ref 0.40–1.20)
GFR: 66.38 mL/min (ref >60.00)
Glucose, Bld: 188 mg/dL — ABNORMAL HIGH (ref 70–99)
Potassium: 3.9 mEq/L (ref 3.5–5.1)
Sodium: 140 mEq/L (ref 135–145)

## 2014-10-04 LAB — BRAIN NATRIURETIC PEPTIDE: Pro B Natriuretic peptide (BNP): 17 pg/mL (ref 0.0–100.0)

## 2014-10-19 ENCOUNTER — Ambulatory Visit (INDEPENDENT_AMBULATORY_CARE_PROVIDER_SITE_OTHER): Payer: Medicaid Other | Admitting: Internal Medicine

## 2014-10-19 DIAGNOSIS — R5383 Other fatigue: Secondary | ICD-10-CM | POA: Diagnosis not present

## 2014-10-19 LAB — PULMONARY FUNCTION TEST
DL/VA % pred: 52 %
DL/VA: 2.63 ml/min/mmHg/L
DLCO unc % pred: 38 %
DLCO unc: 10.3 ml/min/mmHg
FEF 25-75 Post: 0.61 L/sec
FEF 25-75 Pre: 0.48 L/sec
FEF2575-%Change-Post: 26 %
FEF2575-%Pred-Post: 29 %
FEF2575-%Pred-Pre: 23 %
FEV1-%Change-Post: 9 %
FEV1-%Pred-Post: 57 %
FEV1-%Pred-Pre: 52 %
FEV1-Post: 1.24 L
FEV1-Pre: 1.13 L
FEV1FVC-%Change-Post: 4 %
FEV1FVC-%Pred-Pre: 64 %
FEV6-%Change-Post: 6 %
FEV6-%Pred-Post: 84 %
FEV6-%Pred-Pre: 79 %
FEV6-Post: 2.26 L
FEV6-Pre: 2.13 L
FEV6FVC-%Change-Post: 0 %
FEV6FVC-%Pred-Post: 99 %
FEV6FVC-%Pred-Pre: 98 %
FVC-%Change-Post: 5 %
FVC-%Pred-Post: 84 %
FVC-%Pred-Pre: 80 %
FVC-Post: 2.34 L
FVC-Pre: 2.22 L
Post FEV1/FVC ratio: 53 %
Post FEV6/FVC ratio: 96 %
Pre FEV1/FVC ratio: 51 %
Pre FEV6/FVC Ratio: 96 %
RV % pred: 121 %
RV: 2.63 L
TLC % pred: 90 %
TLC: 4.87 L

## 2014-10-19 NOTE — Progress Notes (Signed)
PFT done today. 

## 2014-10-24 ENCOUNTER — Telehealth: Payer: Self-pay | Admitting: Internal Medicine

## 2014-10-24 NOTE — Telephone Encounter (Signed)
Left message to call back  

## 2014-10-24 NOTE — Telephone Encounter (Signed)
New message         Pt calling for PFT results

## 2014-10-24 NOTE — Telephone Encounter (Signed)
Informed patient about lab results. Per Dr. Caryl Comes, labs are normal and labs do not suggest heart failure as cause of SOB. PFT has been done, no result notes at this time. Patient verbalized understanding.  Notes Recorded by Deboraha Sprang, MD on 10/08/2014 at 10:25 AM Please Inform Patient that labs are normal aned labs do not suggest heart failure as cause of sob Lets do PFTs please

## 2014-11-02 ENCOUNTER — Other Ambulatory Visit: Payer: Self-pay | Admitting: Internal Medicine

## 2014-11-02 DIAGNOSIS — Z1231 Encounter for screening mammogram for malignant neoplasm of breast: Secondary | ICD-10-CM

## 2014-11-17 ENCOUNTER — Ambulatory Visit
Admission: RE | Admit: 2014-11-17 | Discharge: 2014-11-17 | Disposition: A | Discharge status: Home / Self Care | Payer: Medicaid Other | Source: Ambulatory Visit | Attending: Internal Medicine | Admitting: Internal Medicine

## 2014-11-17 DIAGNOSIS — Z1231 Encounter for screening mammogram for malignant neoplasm of breast: Secondary | ICD-10-CM

## 2014-11-23 ENCOUNTER — Ambulatory Visit (HOSPITAL_BASED_OUTPATIENT_CLINIC_OR_DEPARTMENT_OTHER): Payer: Medicaid Other | Attending: Internal Medicine | Admitting: *Deleted

## 2014-11-23 VITALS — Ht 66.0 in | Wt 193.6 lb

## 2014-11-23 DIAGNOSIS — I270 Primary pulmonary hypertension: Secondary | ICD-10-CM

## 2014-11-23 DIAGNOSIS — G4733 Obstructive sleep apnea (adult) (pediatric): Secondary | ICD-10-CM | POA: Insufficient documentation

## 2014-11-23 DIAGNOSIS — I493 Ventricular premature depolarization: Secondary | ICD-10-CM | POA: Insufficient documentation

## 2014-11-23 DIAGNOSIS — R5383 Other fatigue: Secondary | ICD-10-CM

## 2014-11-23 DIAGNOSIS — G4710 Hypersomnia, unspecified: Secondary | ICD-10-CM | POA: Diagnosis present

## 2014-11-23 DIAGNOSIS — I272 Pulmonary hypertension, unspecified: Secondary | ICD-10-CM

## 2014-11-23 DIAGNOSIS — R0683 Snoring: Secondary | ICD-10-CM | POA: Diagnosis not present

## 2014-11-23 DIAGNOSIS — G4730 Sleep apnea, unspecified: Secondary | ICD-10-CM

## 2014-12-04 ENCOUNTER — Telehealth: Payer: Self-pay | Admitting: Cardiology

## 2014-12-04 DIAGNOSIS — G4733 Obstructive sleep apnea (adult) (pediatric): Secondary | ICD-10-CM

## 2014-12-04 NOTE — Telephone Encounter (Signed)
Please let patient know that she has mild OSA with significant oxygen desaturations and therefore a trial of CPAP therapy is recommended.  Please set up CPAP titration in the lab

## 2014-12-04 NOTE — Sleep Study (Signed)
   NAME: Erika Leach DATE OF BIRTH:  06-Nov-1950 MEDICAL RECORD NUMBER [id number]  LOCATION: [facility]  PHYSICIAN: TURNER,TRACI R  DATE OF STUDY: 11/23/2014  SLEEP STUDY TYPE: Nocturnal Polysomnogram               REFERRING PHYSICIAN: Deboraha Sprang, MD  INDICATION FOR STUDY: Excessive daytime sleepiness, snoring  EPWORTH SLEEPINESS SCORE: 3 HEIGHT: 5\' 6"  (167.6 cm)  WEIGHT: 193 lb 9.6 oz (87.816 kg)    Body mass index is 31.26 kg/(m^2).  NECK SIZE: 14.5 in.  MEDICATIONS: Reviewed in the chart  SLEEP ARCHITECTURE: The patient slept for a total of 283 minutes out of a total sleep time of 318 minutes.  There was no slow wave sleep and 28 minutes of REM sleep.  The onset to sleep latency was prolonged at 36 minutes and onset to REM sleep latency was prolonged at 134 minutes.  The sleep efficiency was reduced at 79%.  RESPIRATORY DATA: The patient had no apneas and 36 hypopneas.  Most events occurred in the nonsupine position during REM sleep.  The AHI was 7.6 events per hour consistent with mild obstructive sleep apnea/hypopnea syndrome.  There was mild snoring noted.  OXYGEN DATA: The lowest oxygen saturation was 77% and the average oxygen saturation was 93%.  The time spent with oxygen saturations <88% was 16 minutes.   CARDIAC DATA: The patient maintained NSR with PVC's during the study.  The average heart rate was 75 bpm and the lowest heart rate was 34 bpm.  MOVEMENT/PARASOMNIA: There were no periodic limb movements or REM sleep behavior disorders noted.    IMPRESSION/ RECOMMENDATION:   1.  Mild obstructive sleep apnea/hypopnea syndrome with an AHI of 7.6 events per hour.   Most events occurred in the nonsupine position during REM sleep. 2.  Reduced sleep efficiency with increased frequency of arousals due to respiratory events and spontaneous arousals.   3.  Abnormal sleep architecture with no slow wave sleep and prolonged onset to REM sleep. 4.  Oxygen  desaturations associated with respiratory events with lowest oxygen saturation noted to be 77%.  The time spent with oxygen saturations <88% was 16 minutes.  5.  Mild snoring was noted.   6.  PVC's were noted during the study. 7.  Given the presence of sleep disordered breathing resulting in significant oxygen desaturations, excessive daytime sleepiness and snoring, a trial of CPAP therapy is recommended.  Other treatment options to consider would be referral to dentistry for oral device, ENT referral for evaluation of surgical causes of sleep disordered breathing or a trial of weight loss. 8. The patient should be counseled in good sleep hygiene as well as weight loss.    Signed: Sueanne Margarita Diplomate, American Board of Sleep Medicine  ELECTRONICALLY SIGNED ON:  12/04/2014, 7:29 PM [place] PH: [phone]   FX: [phone] [place]

## 2014-12-06 NOTE — Addendum Note (Signed)
Addended by: Andres Ege on: 12/06/2014 09:11 AM   Modules accepted: Orders

## 2014-12-06 NOTE — Telephone Encounter (Signed)
Patient is aware of results, and is okay to proceed with titration.

## 2014-12-06 NOTE — Telephone Encounter (Signed)
Patient is scheduled for CPAP Titration on 02/14/15 8PM

## 2015-01-11 ENCOUNTER — Telehealth: Payer: Self-pay | Admitting: Cardiology

## 2015-01-11 ENCOUNTER — Encounter: Payer: Medicaid Other | Admitting: *Deleted

## 2015-01-11 NOTE — Telephone Encounter (Signed)
Spoke with pt and reminded pt of remote transmission that is due today. Pt verbalized understanding.   

## 2015-01-12 ENCOUNTER — Encounter: Payer: Self-pay | Admitting: Cardiology

## 2015-02-02 ENCOUNTER — Other Ambulatory Visit: Payer: Self-pay | Admitting: Orthopedic Surgery

## 2015-02-02 DIAGNOSIS — M545 Low back pain, unspecified: Secondary | ICD-10-CM

## 2015-02-02 DIAGNOSIS — G8929 Other chronic pain: Secondary | ICD-10-CM

## 2015-02-07 ENCOUNTER — Ambulatory Visit
Admission: RE | Admit: 2015-02-07 | Discharge: 2015-02-07 | Disposition: A | Discharge status: Home / Self Care | Payer: Medicaid Other | Source: Ambulatory Visit | Attending: Orthopedic Surgery | Admitting: Orthopedic Surgery

## 2015-02-07 DIAGNOSIS — M545 Low back pain, unspecified: Secondary | ICD-10-CM

## 2015-02-07 DIAGNOSIS — G8929 Other chronic pain: Secondary | ICD-10-CM

## 2015-02-07 MED ORDER — DIAZEPAM 5 MG PO TABS
5.0000 mg | ORAL_TABLET | Freq: Once | ORAL | Status: AC
  Administered 2015-02-07: 5 mg via ORAL

## 2015-02-07 MED ORDER — IOHEXOL 180 MG/ML  SOLN
15.0000 mL | Freq: Once | INTRAMUSCULAR | Status: AC | PRN
  Administered 2015-02-07: 15 mL via INTRATHECAL

## 2015-02-07 MED ORDER — DIAZEPAM 5 MG PO TABS: 10.0000 mg | ORAL_TABLET | Freq: Once | ORAL | Status: DC

## 2015-02-07 NOTE — Discharge Instructions (Addendum)
Myelogram Discharge Instructions  1. Go home and rest quietly for the next 24 hours.  It is important to lie flat for the next 24 hours.  Get up only to go to the restroom.  You may lie in the bed or on a couch on your back, your stomach, your left side or your right side.  You may have one pillow under your head.  You may have pillows between your knees while you are on your side or under your knees while you are on your back.  2. DO NOT drive today.  Recline the seat as far back as it will go, while still wearing your seat belt, on the way home.  3. You may get up to go to the bathroom as needed.  You may sit up for 10 minutes to eat.  You may resume your normal diet and medications unless otherwise indicated.  Drink lots of extra fluids today and tomorrow.  4. The incidence of headache, nausea, or vomiting is about 5% (one in 20 patients).  If you develop a headache, lie flat and drink plenty of fluids until the headache goes away.  Caffeinated beverages may be helpful.  If you develop severe nausea and vomiting or a headache that does not go away with flat bed rest, call [phone].  5. You may resume normal activities after your 24 hours of bed rest is over; however, do not exert yourself strongly or do any heavy lifting tomorrow. If when you get up you have a headache when standing, go back to bed and force fluids for another 24 hours.  6. Call your physician for a follow-up appointment.  The results of your myelogram will be sent directly to your physician by the following day.  7. If you have any questions or if complications develop after you arrive home, please call [phone].  Discharge instructions have been explained to the patient.  The patient, or the person responsible for the patient, fully understands these instructions.       May resume Tramadol on Aug. 3, 2016, after 1:00 pm.

## 2015-02-07 NOTE — Progress Notes (Signed)
Pt has been off Tramadol and Amitriptyline for the past 2 days.

## 2015-02-14 ENCOUNTER — Ambulatory Visit (HOSPITAL_BASED_OUTPATIENT_CLINIC_OR_DEPARTMENT_OTHER): Payer: Medicaid Other | Attending: Cardiology | Admitting: Radiology

## 2015-02-14 VITALS — Ht 66.0 in | Wt 197.0 lb

## 2015-02-14 DIAGNOSIS — I493 Ventricular premature depolarization: Secondary | ICD-10-CM | POA: Insufficient documentation

## 2015-02-14 DIAGNOSIS — G4733 Obstructive sleep apnea (adult) (pediatric): Secondary | ICD-10-CM | POA: Insufficient documentation

## 2015-02-14 DIAGNOSIS — R0683 Snoring: Secondary | ICD-10-CM | POA: Insufficient documentation

## 2015-02-14 DIAGNOSIS — G4730 Sleep apnea, unspecified: Secondary | ICD-10-CM | POA: Diagnosis present

## 2015-02-15 ENCOUNTER — Telehealth: Payer: Self-pay | Admitting: Cardiology

## 2015-02-15 NOTE — Sleep Study (Signed)
SLEEP STUDY    Patient Name: Erika Leach, Erika Leach Date: 02/14/2015 MRN: [id number] Gender: Female D.O.B: 1951-02-22 Age (years): 38 Referring Provider: Fransico Him MD, ABSM Interpreting Physician: Fransico Him MD, ABSM RPSGT: Carolin Coy  Height (inches): 66 Weight (lbs): 196 BMI: 32 Neck Size: 14.50  CLINICAL INFORMATION The patient is referred for a CPAP titration to treat sleep apnea.  Date of NPSG, Split Night or HST:12/04/2014  SLEEP STUDY TECHNIQUE As per the AASM Manual for the Scoring of Sleep and Associated Events v2.3 (April 2016) with a hypopnea requiring 4% desaturations.  The channels recorded and monitored were frontal, central and occipital EEG, electrooculogram (EOG), submentalis EMG (chin), nasal and oral airflow, thoracic and abdominal wall motion, anterior tibialis EMG, snore microphone, electrocardiogram, and pulse oximetry. Continuous positive airway pressure (CPAP) was initiated at the beginning of the study and titrated to treat sleep-disordered breathing.  MEDICATIONS Medications taken by the patient : Qvar, ProAir, Cardizem, Clonidine, Prinizide, Claritin, Mevacor, Tramadol Medications administered by patient during sleep study : No sleep medicine administered.  TECHNICIAN COMMENTS Comments added by technician: Patient was fitted with a Resmed Airfit F-10 nasal/oral full face mask. Patient took trazodone at 10:00 pm  Comments added by scorer: N/A  RESPIRATORY PARAMETERS Optimal PAP Pressure (cm): 6  AHI at Optimal Pressure (/hr):1.4 Overall Minimal O2 (%):85.00   Supine % at Optimal Pressure (%): N/A Minimal O2 at Optimal Pressure (%):85.00      SLEEP ARCHITECTURE The study was initiated at 10:38:53 PM and ended at 5:00:47 AM.  Sleep onset time was 27.5 minutes and the sleep efficiency was normal at 86.2%. The total sleep time was 329.0 minutes.  The patient spent 9.12% of the night in stage N1 sleep, 75.84% in stage N2 sleep, 0.61% in  stage N3 and 14.44% in REM.Stage REM latency was 79.5 minutes  Wake after sleep onset was 25.4. Alpha intrusion was absent. Supine sleep was 12.44%.  CARDIAC DATA The 2 lead EKG demonstrated sinus rhythm with ventricular pacing. The mean heart rate was 81.00 beats per minute. Other EKG findings include: PVCs.  LEG MOVEMENT DATA The total Periodic Limb Movements of Sleep (PLMS) were 208. The PLMS index was 37.93. A PLMS index of <15 is considered normal in adults.  IMPRESSIONS An optimal PAP pressure of 6cm H2O was obtained. Central sleep apnea was not noted during this titration (CAI = 0.0/h). Moderate oxygen desaturations were observed during this titration (min O2 = 85.00%). The patient snored with Moderate snoring volume during this titration study. 2-lead EKG demonstrated: NSR with ventricular pacing and PVCs Moderate periodic limb movements were observed during this study. Arousals associated with PLMs were significant.  DIAGNOSIS Obstructive Sleep Apnea (327.23 [G47.33 ICD-10])  RECOMMENDATIONS Recommend CPAP at 6cm H2O with heated humidifier, and mask of choice. Avoid alcohol, sedatives and other CNS depressants that may worsen sleep apnea and disrupt normal sleep architecture. Sleep hygiene should be reviewed to assess factors that may improve sleep quality. Weight management and regular exercise should be initiated or continued. Followup in sleep clinic in 10 weeks. Overnight pulse oximetry on CPAP to make sure there is no persistence of hypoxemia.  Signed: Fransico Him, MD ABSM Diplomate, American Board of Sleep Medicine

## 2015-02-15 NOTE — Telephone Encounter (Signed)
Pt had successful PAP titration. Please setup appointment in 10 weeks. Please let [facility] know that order for PAP is in EPIC.   

## 2015-02-15 NOTE — Addendum Note (Signed)
Addended by: Sueanne Margarita on: 02/15/2015 07:37 PM   Modules accepted: Orders

## 2015-02-16 NOTE — Telephone Encounter (Signed)
Patient is aware of results.  [place] Notified.  Once she gets her machine, we will schedule 10 week follow-up

## 2015-04-24 ENCOUNTER — Encounter: Payer: Self-pay | Admitting: Cardiology

## 2015-05-25 ENCOUNTER — Ambulatory Visit: Payer: Medicaid Other | Admitting: Cardiology

## 2015-09-22 ENCOUNTER — Encounter: Payer: Medicaid Other | Admitting: Internal Medicine

## 2015-09-27 ENCOUNTER — Encounter: Payer: Self-pay | Admitting: Internal Medicine

## 2015-10-30 ENCOUNTER — Other Ambulatory Visit: Payer: Self-pay

## 2015-10-30 DIAGNOSIS — Z1231 Encounter for screening mammogram for malignant neoplasm of breast: Secondary | ICD-10-CM

## 2015-11-01 ENCOUNTER — Ambulatory Visit (INDEPENDENT_AMBULATORY_CARE_PROVIDER_SITE_OTHER)
Admission: RE | Admit: 2015-11-01 | Discharge: 2015-11-01 | Disposition: A | Discharge status: Home / Self Care | Payer: Medicare Other | Source: Ambulatory Visit | Attending: Internal Medicine | Admitting: Internal Medicine

## 2015-11-01 ENCOUNTER — Encounter (INDEPENDENT_AMBULATORY_CARE_PROVIDER_SITE_OTHER): Payer: Self-pay

## 2015-11-01 ENCOUNTER — Ambulatory Visit (INDEPENDENT_AMBULATORY_CARE_PROVIDER_SITE_OTHER): Payer: Medicare Other | Admitting: Internal Medicine

## 2015-11-01 ENCOUNTER — Encounter: Payer: Self-pay | Admitting: Internal Medicine

## 2015-11-01 VITALS — BP 138/88 | HR 77 | Ht 65.0 in | Wt 200.8 lb

## 2015-11-01 DIAGNOSIS — I10 Essential (primary) hypertension: Secondary | ICD-10-CM

## 2015-11-01 DIAGNOSIS — J449 Chronic obstructive pulmonary disease, unspecified: Secondary | ICD-10-CM | POA: Insufficient documentation

## 2015-11-01 MED ORDER — VALSARTAN-HYDROCHLOROTHIAZIDE 160-12.5 MG PO TABS: 1.0000 | ORAL_TABLET | Freq: Every day | ORAL | Status: DC

## 2015-11-01 NOTE — Assessment & Plan Note (Signed)
PFT's 10/19/14   FEV1 1.24 (57 % ) ratio 53  p 9 % improvement from saba p ?  prior to study with DLCO  38 % corrects to 52 % for alv volume     When respiratory symptoms begin or become refractory well after a patient reports complete smoking cessation,  Especially when this wasn't the case while they were smoking, a red flag is raised based on the work of Dr Kris Mouton which states:  if you quit smoking when your best day FEV1 is still well preserved it is highly unlikely you will progress to severe disease.  That is to say, once the smoking stops,  the symptoms should not suddenly erupt or markedly worsen.  If so, the differential diagnosis should include  Obesity(note 40 lb wt gain since quit)/deconditioning,  LPR/Reflux/Aspiration syndromes,  occult CHF, or  especially side effect of medications commonly used in this population, especially acei > needs first try off / diet for gerd which avoids menthol cough drops completely, then regroup in 4 weeks and determine what if any maint rx she really needs

## 2015-11-01 NOTE — Progress Notes (Signed)
Subjective:    Patient ID: Erika Leach, female    DOB: 07/28/1950, 65 y.o.   MRN: [id number]  HPI  65 yobf  with h/o childhood asthma outgrew by [facility] with onset breathing problems during pregnancies but no maint rx then worse 2013 / quit 2014(wt 165)  and on inhalers ever since so referred to pulmonary clinic 11/01/2015 by Dr Sheryle Hail   11/01/2015 1st [place] office visit/ Wert   Chief Complaint  Patient presents with  . Pulmonary Consult    Referred by Dr. Antonietta Jewel. Pt c/o SOB x 4 yrs- "all the time" with or without any exertion. She also c/o prod cough with white sputum.    usually no cough first thing then as stir around starts cough/  some coughing also  at bedtime but doesn't keep her up, cough drops work the best/ so severe she gags >  proair helps for a few hours then bad again, no better on qvar so dc'd. Can lose breath sitting with / without cough / gag On Lisinopril since quit smoking and pattern has persisted daily since then even on pred which "just makes her fat, doesn't really help sob or cough"  No obvious other patterns in day to day or daytime variabilty or assoc excess/ purulent sputum or mucus plugs   or cp or chest tightness, subjective wheeze overt sinus or hb symptoms. No unusual exp hx or h/o childhood pna/ asthma or knowledge of premature birth.  Sleeping ok without nocturnal  or early am exacerbation  of respiratory  c/o's or need for noct saba. Also denies any obvious fluctuation of symptoms with weather or environmental changes or other aggravating or alleviating factors except as outlined above   Current Medications, Allergies, Complete Past Medical History, Past Surgical History, Family History, and Social History were reviewed in [employer] record.            Review of Systems  Constitutional: Negative for fever, chills and unexpected weight change.  HENT: Negative for congestion, dental problem, ear pain, nosebleeds,  postnasal drip, rhinorrhea, sinus pressure, sneezing, sore throat, trouble swallowing and voice change.   Eyes: Negative for visual disturbance.  Respiratory: Positive for cough and shortness of breath. Negative for choking.   Cardiovascular: Negative for chest pain and leg swelling.  Gastrointestinal: Negative for vomiting, abdominal pain and diarrhea.  Genitourinary: Negative for difficulty urinating.  Musculoskeletal: Negative for arthralgias.  Skin: Negative for rash.  Neurological: Negative for tremors, syncope and headaches.  Hematological: Does not bruise/bleed easily.       Objective:   Physical Exam  amb obese bf mod hoarse with harsh upper airway cough  Wt Readings from Last 3 Encounters:  11/01/15 200 lb 12.8 oz (91.082 kg)  02/14/15 197 lb (89.359 kg)  11/23/14 193 lb 9.6 oz (87.816 kg)    Vital signs reviewed   HEENT: nl dentition, turbinates, and oropharynx. Nl external ear canals without cough reflex   NECK :  without JVD/Nodes/TM/ nl carotid upstrokes bilaterally   LUNGS: no acc muscle use,  Nl contour chest which is clear to A and P bilaterally without cough on insp or exp maneuvers   CV:  RRR  no s3 or murmur or increase in P2, no edema   ABD:  soft and nontender with nl inspiratory excursion in the supine position. No bruits or organomegaly, bowel sounds nl  MS:  Nl gait/ ext warm without deformities, calf tenderness, cyanosis or clubbing No obvious joint  restrictions   SKIN: warm and dry without lesions    NEURO:  alert, approp, nl sensorium with  no motor deficits   CXR PA and Lateral:   11/01/2015 :    I personally reviewed images and agree with radiology impression as follows:    Left dual lead pacer remains in place, unchanged. There is hyperinflation of the lungs compatible with COPD. Heart and mediastinal contours are within normal limits. No focal opacities or effusions. No acute bony abnormality.          Assessment & Plan:

## 2015-11-01 NOTE — Patient Instructions (Addendum)
Stop lisiopril and start valsartan 160/12.5 one daily and your cough should gradually resolve over several weeks   Only use your albuterol as a rescue medication to be used if you can't catch your breath by resting or doing a relaxed purse lip breathing pattern.  - The less you use it, the better it will work when you need it. - Ok to use up to 2 puffs  every 4 hours if you must but call for immediate appointment if use goes up over your usual need - Don't leave home without it !!  (think of it like the spare tire for your car)    GERD (REFLUX)  is an extremely common cause of respiratory symptoms just like yours , many times with no obvious heartburn at all.    It can be treated with medication, but also with lifestyle changes including elevation of the head of your bed (ideally with 6 inch  bed blocks),  Smoking cessation, avoidance of late meals, excessive alcohol, and avoid fatty foods, chocolate, peppermint, colas, red wine, and acidic juices such as orange juice.  NO MINT OR MENTHOL PRODUCTS SO NO COUGH DROPS  USE SUGARLESS CANDY INSTEAD (Jolley ranchers or Stover's or Life Savers) or even ice chips will also do - the key is to swallow to prevent all throat clearing. NO OIL BASED VITAMINS - use powdered substitutes.  Please remember to go to the x-ray department downstairs for your tests - we will call you with the results when they are available.    Please schedule a follow up office visit in 4 weeks, sooner if needed to address any and all of  the rest of your respiratory symptoms that don't resolve off lisinopril (it's the only way to sort this out)

## 2015-11-01 NOTE — Assessment & Plan Note (Signed)
In the best review of chronic cough to date ( NEJM 2016 375 [phone]) ,  ACEi are now felt to cause cough in up to  20% of pts which is a 4 fold increase from previous reports and does not include the variety of non-specific complaints we see in pulmonary clinic in pts on ACEi but previously attributed to another dx like  Copd/asthma and  include PNDS, throat and chest congestion, "bronchitis", unexplained dyspnea and noct "strangling" sensations, and hoarseness, but also  atypical /refractory GERD symptoms like dysphagia and "bad heartburn"   The only way I know  to prove this is not an "ACEi Case" is a trial off ACEi x a minimum of 6 weeks then regroup.  Try diovan 160 /12.5 mg daily and re-eval in 4 weeks

## 2015-11-01 NOTE — Progress Notes (Signed)
Quick Note:  Spoke with pt and notified of results per Dr. Wert. Pt verbalized understanding and denied any questions.  ______ 

## 2015-11-20 ENCOUNTER — Ambulatory Visit
Admission: RE | Admit: 2015-11-20 | Discharge: 2015-11-20 | Disposition: A | Discharge status: Home / Self Care | Payer: Medicare Other | Source: Ambulatory Visit

## 2015-11-20 DIAGNOSIS — Z1231 Encounter for screening mammogram for malignant neoplasm of breast: Secondary | ICD-10-CM

## 2015-11-29 ENCOUNTER — Encounter: Payer: Self-pay | Admitting: Internal Medicine

## 2015-11-29 ENCOUNTER — Ambulatory Visit (INDEPENDENT_AMBULATORY_CARE_PROVIDER_SITE_OTHER): Payer: Medicare Other | Admitting: Internal Medicine

## 2015-11-29 VITALS — BP 132/88 | HR 72 | Ht 65.0 in | Wt 200.0 lb

## 2015-11-29 DIAGNOSIS — I10 Essential (primary) hypertension: Secondary | ICD-10-CM | POA: Diagnosis not present

## 2015-11-29 DIAGNOSIS — J449 Chronic obstructive pulmonary disease, unspecified: Secondary | ICD-10-CM

## 2015-11-29 MED ORDER — GLYCOPYRROLATE-FORMOTEROL 9-4.8 MCG/ACT IN AERO: 2.0000 | INHALATION_SPRAY | Freq: Two times a day (BID) | RESPIRATORY_TRACT | Status: DC

## 2015-11-29 NOTE — Assessment & Plan Note (Addendum)
Complicated by HBP/ Hyperlipidemia/ DM   Body mass index is 33.28    Lab Results  Component Value Date   TSH 1.593 06/23/2012     Contributing  gerd tendency/ doe/reviewed the need and the process to achieve and maintain neg calorie balance > defer f/u primary care including intermittently monitoring thyroid status

## 2015-11-29 NOTE — Progress Notes (Signed)
Subjective:    Patient ID: Erika Leach, female    DOB: December 28, 1950, 65 y.o.   MRN: [id number]  HPI  38 yobf  with h/o childhood asthma outgrew by [facility] with onset breathing problems during pregnancies but no maint rx then worse 2013 / quit 2014(wt 165)  and on inhalers ever since so referred to pulmonary clinic 11/01/2015 by Dr Sheryle Hail   11/01/2015 1st [place] [facility] office visit/ Wert   Chief Complaint  Patient presents with  . Pulmonary Consult    Referred by Dr. Antonietta Jewel. Pt c/o SOB x 4 yrs- "all the time" with or without any exertion. She also c/o prod cough with white sputum.    usually no cough first thing then as stir around starts cough/  some coughing also  at bedtime but doesn't keep her up, cough drops work the best/ so severe she gags >  proair helps for a few hours then bad again, no better on qvar so dc'd. Can lose breath sitting with / without cough / gag On Lisinopril since quit smoking and pattern has persisted daily since then even on pred which "just makes her fat, doesn't really help sob or cough" rec Stop lisiopril and start valsartan 160/12.5 one daily and your cough should gradually resolve over several weeks  Only use your albuterol as a rescue medication  GERD    Please schedule a follow up office visit in 4 weeks, sooner if needed to address any and all of  the rest of your respiratory symptoms that don't resolve off lisinopril (it's the only way to sort this out)      11/29/2015  f/u ov/Wert re: acei cough gone/  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 Follow-up    Breathing is unchanged. Her cough is much better. She is using albuterol 1-2 x daily on average.   MMRC2 = can't walk a nl pace on a flat grade s sob but does fine slow and flat eg [employer]   No obvious day to day or daytime variability or assoc excess/ purulent sputum or mucus plugs or hemoptysis or cp or chest tightness, subjective wheeze or overt sinus or  hb symptoms. No unusual exp hx or h/o childhood pna/ asthma or knowledge of premature birth.  Sleeping ok without nocturnal  or early am exacerbation  of respiratory  c/o's or need for noct saba. Also denies any obvious fluctuation of symptoms with weather or environmental changes or other aggravating or alleviating factors except as outlined above   Current Medications, Allergies, Complete Past Medical History, Past Surgical History, Family History, and Social History were reviewed in [employer] record.  ROS  The following are not active complaints unless bolded sore throat, dysphagia, dental problems, itching, sneezing,  nasal congestion or excess/ purulent secretions, ear ache,   fever, chills, sweats, unintended wt loss, classically pleuritic or exertional cp,  orthopnea pnd or leg swelling, presyncope, palpitations, abdominal pain, anorexia, nausea, vomiting, diarrhea  or change in bowel or bladder habits, change in stools or urine, dysuria,hematuria,  rash, arthralgias, visual complaints, headache, numbness, weakness or ataxia or problems with walking or coordination,  change in mood/affect or memory.               .       Objective:   Physical Exam  amb obese bf     11/29/2015       200   11/01/15 200  lb 12.8 oz (91.082 kg)  02/14/15 197 lb (89.359 kg)  11/23/14 193 lb 9.6 oz (87.816 kg)    Vital signs reviewed   HEENT: nl dentition, turbinates, and oropharynx. Nl external ear canals without cough reflex   NECK :  without JVD/Nodes/TM/ nl carotid upstrokes bilaterally   LUNGS: no acc muscle use,  Nl contour chest which is clear to A and P bilaterally without cough on insp or exp maneuvers   CV:  RRR  no s3 or murmur or increase in P2, no edema   ABD:  soft and nontender with nl inspiratory excursion in the supine position. No bruits or organomegaly, bowel sounds nl  MS:  Nl gait/ ext warm without deformities, calf tenderness, cyanosis or  clubbing No obvious joint restrictions   SKIN: warm and dry without lesions    NEURO:  alert, approp, nl sensorium with  no motor deficits   CXR PA and Lateral:   11/01/2015 :    I personally reviewed images and agree with radiology impression as follows:    Left dual lead pacer remains in place, unchanged. There is hyperinflation of the lungs compatible with COPD. Heart and mediastinal contours are within normal limits. No focal opacities or effusions. No acute bony abnormality.          Assessment & Plan:   Outpatient Encounter Prescriptions as of 11/29/2015  Medication Sig  . albuterol (PROAIR HFA) 108 (90 BASE) MCG/ACT inhaler Inhale 2 puffs into the lungs every 6 (six) hours as needed for wheezing or shortness of breath.   . cloNIDine (CATAPRES) 0.1 MG tablet Take 0.1 mg by mouth 2 (two) times daily.  Marland Kitchen glipiZIDE (GLUCOTROL) 5 MG tablet Take 5 mg by mouth daily before breakfast.  . lovastatin (MEVACOR) 20 MG tablet Take 20 mg by mouth at bedtime.  . traMADol (ULTRAM) 50 MG tablet Take 50-100 mg by mouth daily as needed for pain.   . valsartan-hydrochlorothiazide (DIOVAN HCT) 160-12.5 MG tablet Take 1 tablet by mouth daily.  . Vitamin D, Ergocalciferol, (DRISDOL) 50000 units CAPS capsule TAKE ONE CAPSULE BY MOUTH EACH WEEK  . Glycopyrrolate-Formoterol (BEVESPI AEROSPHERE) 9-4.8 MCG/ACT AERO Inhale 2 puffs into the lungs 2 (two) times daily.   No facility-administered encounter medications on file as of 11/29/2015.

## 2015-11-29 NOTE — Patient Instructions (Signed)
Plan A = Automatic = Bevespi Take 2 puffs first thing in am and then another 2 puffs about 12 hours later.      Plan B = Backup Only use your albuterol (proair) as a rescue medication to be used if you can't catch your breath by resting or doing a relaxed purse lip breathing pattern.  - The less you use it, the better it will work when you need it. - Ok to use the inhaler up to 2 puffs  every 4 hours if you must but call for appointment if use goes up over your usual need - Don't leave home without it !!  (think of it like the spare tire for your car)    If you are satisfied with your treatment plan,  let your doctor know and he/she can either refill your medications or you can return here when your prescription runs out.     If in any way you are not 100% satisfied,  please tell us.  If 100% better, tell your friends!  Pulmonary follow up is as needed

## 2015-11-29 NOTE — Assessment & Plan Note (Signed)
PFT's 10/19/14   FEV1 1.24 (57 % ) ratio 53  p 9 % improvement from saba p ?  prior to study with DLCO  38 % corrects to 52 % for alv volume   - 11/29/2015  After extensive coaching HFA effectiveness =    75% > try bevespi  GROUP B guidelines rec laba/lama and she is already familiar with hfa though certainly room for improvement in terms of technique   rec trial of bevespi and fill rx if improves doe, if not just use saba prn  I had an extended final summary discussion with the patient reviewing all relevant studies completed to date and  lasting 15 to 20 minutes of a 25 minute visit on the following issues:    Formulary restrictions will be an ongoing challenge for the forseable future and I would be happy to pick an alternative if the pt will first  provide me a list of them but pt  will need to return here for training for any new device that is required eg dpi vs hfa vs respimat.    In meantime we can always provide samples so the patient never runs out of any needed respiratory medications.  Each maintenance medication was reviewed in detail including most importantly the difference between maintenance and as needed and under what circumstances the prns are to be used.  Please see instructions for details which were reviewed in writing and the patient given a copy.

## 2015-11-29 NOTE — Assessment & Plan Note (Addendum)
Trial off acei 11/01/2015 due to cough> resolved 11/29/2015    Adequate control on present rx, reviewed > no change in rx needed    Although even in retrospect it may not be clear the ACEi contributed to the pt's symptoms,  Pt improved off them and adding them back at this point or in the future would risk confusion in interpretation of non-specific respiratory symptoms to which this patient is prone  ie  Better not to muddy the waters here.

## 2015-12-08 ENCOUNTER — Ambulatory Visit (INDEPENDENT_AMBULATORY_CARE_PROVIDER_SITE_OTHER): Payer: Medicare Other | Admitting: Internal Medicine

## 2015-12-08 ENCOUNTER — Encounter: Payer: Self-pay | Admitting: Internal Medicine

## 2015-12-08 VITALS — BP 124/82 | HR 96 | Ht 65.0 in | Wt 200.6 lb

## 2015-12-08 DIAGNOSIS — I442 Atrioventricular block, complete: Secondary | ICD-10-CM

## 2015-12-08 DIAGNOSIS — Z950 Presence of cardiac pacemaker: Secondary | ICD-10-CM | POA: Diagnosis not present

## 2015-12-08 LAB — CUP PACEART INCLINIC DEVICE CHECK
Battery Impedance: 228 Ohm
Battery Remaining Longevity: 107 mo
Battery Voltage: 2.78 V
Brady Statistic AP VP Percent: 2 %
Brady Statistic AP VS Percent: 0 %
Brady Statistic AS VP Percent: 98 %
Brady Statistic AS VS Percent: 0 %
Date Time Interrogation Session: 20170602105634
Implantable Lead Implant Date: 20131218
Implantable Lead Implant Date: 20131218
Implantable Lead Location: 753859
Implantable Lead Location: 753860
Implantable Lead Model: 5076
Implantable Lead Model: 5076
Lead Channel Impedance Value: 493 Ohm
Lead Channel Impedance Value: 549 Ohm
Lead Channel Pacing Threshold Amplitude: 0.5 V
Lead Channel Pacing Threshold Amplitude: 0.75 V
Lead Channel Pacing Threshold Pulse Width: 0.4 ms
Lead Channel Pacing Threshold Pulse Width: 0.4 ms
Lead Channel Sensing Intrinsic Amplitude: 4 mV
Lead Channel Sensing Intrinsic Amplitude: 4 mV
Lead Channel Setting Pacing Amplitude: 2 V
Lead Channel Setting Pacing Amplitude: 2.5 V
Lead Channel Setting Pacing Pulse Width: 0.4 ms
Lead Channel Setting Sensing Sensitivity: 2 mV

## 2015-12-08 LAB — BASIC METABOLIC PANEL
BUN: 13 mg/dL (ref 7–25)
CO2: 26 mmol/L (ref 20–31)
Calcium: 10 mg/dL (ref 8.6–10.4)
Chloride: 98 mmol/L (ref 98–110)
Creat: 0.98 mg/dL (ref 0.50–0.99)
Glucose, Bld: 217 mg/dL — ABNORMAL HIGH (ref 65–99)
Potassium: 4.3 mmol/L (ref 3.5–5.3)
Sodium: 137 mmol/L (ref 135–146)

## 2015-12-08 NOTE — Progress Notes (Signed)
Patient Care Team: Antonietta Jewel, MD as PCP - General (Internal Medicine)   HPI  Erika Leach is a 65 y.o. female Seen in followup for PM implanted for CHB 12/13 after presenting with a fall   She continues to complain of exercise associated shortness of breath.  This is accompanied by chest pressure.   She is also no struggling with shortness of breath at rest. Little bit of edema.  10/14 Cath>> normal LV function and arteries. Notably however her LVEDP was 18.  Stress echo 1/15 was negative. There is also no exercise associated MR   LV function at that time remained at the lower limits of normal.  She snores and has daytime somnolence.  Her sleep study was abnormal but she was unable to tolerate CPAP. She says she knows what her problem is now.  She is overweight. 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 Asthma   . HTN (hypertension)     poorly controlled  . Complete heart block ([place]) 06/24/2012  . 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  . Chest pain 2014    Cath 10/14>>normal CA    Past Surgical History  Procedure Laterality Date  . Tubal ligation    . Appendectomy    . Permanent pacemaker insertion N/A 06/24/2012    Procedure: PERMANENT PACEMAKER INSERTION;  Surgeon: Deboraha Sprang, MD;  Location: [facility] CATH LAB;  Service: Cardiovascular;  Laterality: N/A;  . Left heart catheterization with coronary angiogram N/A 04/30/2013    Procedure: LEFT HEART CATHETERIZATION WITH CORONARY ANGIOGRAM;  Surgeon: Peter M Martinique, MD;  Location: [facility] CATH LAB;  Service: Cardiovascular;  Laterality: N/A;    Current Outpatient Prescriptions  Medication Sig Dispense Refill  . albuterol (PROAIR HFA) 108 (90 BASE) MCG/ACT inhaler Inhale 2 puffs into the lungs every 6 (six) hours as needed for wheezing or shortness of breath.     . cloNIDine (CATAPRES) 0.1 MG  tablet Take 0.1 mg by mouth 2 (two) times daily.    Marland Kitchen glipiZIDE (GLUCOTROL) 5 MG tablet Take 5 mg by mouth daily before breakfast.    . Glycopyrrolate-Formoterol (BEVESPI AEROSPHERE) 9-4.8 MCG/ACT AERO Inhale 2 puffs into the lungs 2 (two) times daily. 1 Inhaler 11  . lovastatin (MEVACOR) 20 MG tablet Take 20 mg by mouth at bedtime.    . traMADol (ULTRAM) 50 MG tablet Take 50-100 mg by mouth daily as needed for pain.     . valsartan-hydrochlorothiazide (DIOVAN HCT) 160-12.5 MG tablet Take 1 tablet by mouth daily. 30 tablet 11  . Vitamin D, Ergocalciferol, (DRISDOL) 50000 units CAPS capsule TAKE ONE CAPSULE BY MOUTH EACH WEEK  6   No current facility-administered medications for this visit.    No Known Allergies  Review of Systems negative except from HPI and PMH  Physical Exam BP 124/82 mmHg  Pulse 96  Ht 5\' 5"  (1.651 m)  Wt 200 lb 9.6 oz (90.992 kg)  BMI 33.38 kg/m2 Well developed and well nourished in no acute distress HENT normal E scleral and icterus clear Neck Supple JVP6-8; carotids brisk and full Clear to ausculation  Regular rate and rhythm, no murmurs gallops or rub Soft with active bowel sounds No clubbing cyanosis none Edema Alert and oriented, grossly normal motor and sensory function Skin Warm and Dry  ECG demonstrates P. synchronous pacing  Assessment and  Plan  Complete heart block  Pacemaker-Medtronic The patient's device was interrogated.  The information was reviewed. No changes were made in the programming.    Dyspnea on exertion/HFpEF  Hypertension  Well-controlled  Obstructive sleep apnea/COPD   Obesity  She is euvolemic. Her blood pressure is well-controlled.   she has been unsuccessful in losing weight.  We discussed the [employer] check a metabolic profile on her diuretic

## 2015-12-08 NOTE — Patient Instructions (Addendum)
Medication Instructions: - Your physician recommends that you continue on your current medications as directed. Please refer to the Current Medication list given to you today.   Labwork: - Your physician recommends that you have lab work today: [employer]  Procedures/Testing: - none  Follow-Up: - Remote monitoring is used to monitor your Pacemaker of ICD from home. This monitoring reduces the number of office visits required to check your device to one time per year. It allows [place] to keep an eye on the functioning of your device to ensure it is working properly. You are scheduled for a device check from home on 03/11/16. You may send your transmission at any time that day. If you have a wireless device, the transmission will be sent automatically. After your physician reviews your transmission, you will receive a postcard with your next transmission date.  - Your physician wants you to follow-up in: 1 year with Tommye Standard, PA for Dr. Caryl Comes. You will receive a reminder letter in the mail two months in advance. If you don't receive a letter, please call our office to schedule the follow-up appointment.  Any Additional Special Instructions Will Be Listed Below (If Applicable).     If you need a refill on your cardiac medications before your next appointment, please call your pharmacy.

## 2016-03-12 ENCOUNTER — Telehealth: Payer: Self-pay | Admitting: Cardiology

## 2016-03-12 ENCOUNTER — Ambulatory Visit (INDEPENDENT_AMBULATORY_CARE_PROVIDER_SITE_OTHER): Payer: Medicare Other | Admitting: *Deleted

## 2016-03-12 DIAGNOSIS — I442 Atrioventricular block, complete: Secondary | ICD-10-CM

## 2016-03-12 DIAGNOSIS — Z950 Presence of cardiac pacemaker: Secondary | ICD-10-CM

## 2016-03-12 NOTE — Telephone Encounter (Signed)
Spoke with pt and reminded pt of remote transmission that is due today. Pt verbalized understanding.   

## 2016-03-13 LAB — CUP PACEART REMOTE DEVICE CHECK
Battery Impedance: 252 Ohm
Battery Remaining Longevity: 107 mo
Battery Voltage: 2.78 V
Brady Statistic AP VP Percent: 20 %
Brady Statistic AP VS Percent: 0 %
Brady Statistic AS VP Percent: 80 %
Brady Statistic AS VS Percent: 0 %
Date Time Interrogation Session: 20170905174403
Implantable Lead Implant Date: 20131218
Implantable Lead Implant Date: 20131218
Implantable Lead Location: 753859
Implantable Lead Location: 753860
Implantable Lead Model: 5076
Implantable Lead Model: 5076
Lead Channel Impedance Value: 503 Ohm
Lead Channel Impedance Value: 584 Ohm
Lead Channel Pacing Threshold Amplitude: 0.625 V
Lead Channel Pacing Threshold Amplitude: 0.75 V
Lead Channel Pacing Threshold Pulse Width: 0.4 ms
Lead Channel Pacing Threshold Pulse Width: 0.4 ms
Lead Channel Sensing Intrinsic Amplitude: 2.8 mV
Lead Channel Setting Pacing Amplitude: 2 V
Lead Channel Setting Pacing Amplitude: 2.5 V
Lead Channel Setting Pacing Pulse Width: 0.4 ms
Lead Channel Setting Sensing Sensitivity: 2 mV

## 2016-03-13 NOTE — Progress Notes (Signed)
Remote pacemaker transmission.   

## 2016-03-15 ENCOUNTER — Encounter: Payer: Self-pay | Admitting: Cardiology

## 2016-03-29 ENCOUNTER — Encounter: Payer: Self-pay | Admitting: Cardiology

## 2016-06-11 ENCOUNTER — Telehealth: Payer: Self-pay | Admitting: Cardiology

## 2016-06-11 ENCOUNTER — Encounter: Payer: Medicare Other | Admitting: *Deleted

## 2016-06-11 NOTE — Telephone Encounter (Signed)
Spoke with pt and reminded pt of remote transmission that is due today. Pt verbalized understanding.   

## 2016-06-21 ENCOUNTER — Encounter: Payer: Self-pay | Admitting: Cardiology

## 2016-06-24 ENCOUNTER — Ambulatory Visit (INDEPENDENT_AMBULATORY_CARE_PROVIDER_SITE_OTHER): Payer: Medicare Other | Admitting: *Deleted

## 2016-06-24 ENCOUNTER — Telehealth: Payer: Self-pay | Admitting: Internal Medicine

## 2016-06-24 DIAGNOSIS — I442 Atrioventricular block, complete: Secondary | ICD-10-CM | POA: Diagnosis not present

## 2016-06-24 NOTE — Telephone Encounter (Signed)
New message   Pt is calling in regards to her transmision  Needs to know if it went through  Please call back

## 2016-06-24 NOTE — Telephone Encounter (Signed)
I clarified how to connect the monitor with cellular adapter. She feels confident that it will work now. She will attempt another transmission now and would like a call when it is received. I have agreed to call her back.  Transmission received- Erika Leach aware.

## 2016-06-25 LAB — CUP PACEART REMOTE DEVICE CHECK
Battery Impedance: 252 Ohm
Battery Remaining Longevity: 107 mo
Battery Voltage: 2.79 V
Brady Statistic AP VP Percent: 21 %
Brady Statistic AP VS Percent: 0 %
Brady Statistic AS VP Percent: 79 %
Brady Statistic AS VS Percent: 0 %
Date Time Interrogation Session: 20171218145130
Implantable Lead Implant Date: 20131218
Implantable Lead Implant Date: 20131218
Implantable Lead Location: 753859
Implantable Lead Location: 753860
Implantable Lead Model: 5076
Implantable Lead Model: 5076
Implantable Pulse Generator Implant Date: 20131218
Lead Channel Impedance Value: 510 Ohm
Lead Channel Impedance Value: 540 Ohm
Lead Channel Pacing Threshold Amplitude: 0.625 V
Lead Channel Pacing Threshold Amplitude: 0.75 V
Lead Channel Pacing Threshold Pulse Width: 0.4 ms
Lead Channel Pacing Threshold Pulse Width: 0.4 ms
Lead Channel Setting Pacing Amplitude: 2 V
Lead Channel Setting Pacing Amplitude: 2.5 V
Lead Channel Setting Pacing Pulse Width: 0.4 ms
Lead Channel Setting Sensing Sensitivity: 2 mV

## 2016-06-25 NOTE — Progress Notes (Signed)
Remote pacemaker transmission.   

## 2016-06-26 ENCOUNTER — Encounter: Payer: Self-pay | Admitting: Cardiology

## 2016-09-23 ENCOUNTER — Telehealth: Payer: Self-pay | Admitting: Cardiology

## 2016-09-23 ENCOUNTER — Ambulatory Visit (INDEPENDENT_AMBULATORY_CARE_PROVIDER_SITE_OTHER): Payer: Medicare Other | Admitting: *Deleted

## 2016-09-23 ENCOUNTER — Telehealth: Payer: Self-pay | Admitting: Internal Medicine

## 2016-09-23 DIAGNOSIS — I442 Atrioventricular block, complete: Secondary | ICD-10-CM | POA: Diagnosis not present

## 2016-09-23 NOTE — Telephone Encounter (Signed)
Spoke w/ pt and informed her that we did receive her remote transmission. Pt verbalized understanding.  

## 2016-09-23 NOTE — Telephone Encounter (Signed)
New Message     Pt is returning Barbara's call

## 2016-09-23 NOTE — Progress Notes (Signed)
Remote pacemaker transmission.   

## 2016-09-23 NOTE — Telephone Encounter (Signed)
LMOVM reminding pt to send remote transmission.   

## 2016-09-25 ENCOUNTER — Encounter: Payer: Self-pay | Admitting: Cardiology

## 2016-09-25 LAB — CUP PACEART REMOTE DEVICE CHECK
Battery Impedance: 276 Ohm
Battery Remaining Longevity: 103 mo
Battery Voltage: 2.79 V
Brady Statistic AP VP Percent: 27 %
Brady Statistic AP VS Percent: 0 %
Brady Statistic AS VP Percent: 72 %
Brady Statistic AS VS Percent: 1 %
Date Time Interrogation Session: 20180319144651
Implantable Lead Implant Date: 20131218
Implantable Lead Implant Date: 20131218
Implantable Lead Location: 753859
Implantable Lead Location: 753860
Implantable Lead Model: 5076
Implantable Lead Model: 5076
Implantable Pulse Generator Implant Date: 20131218
Lead Channel Impedance Value: 513 Ohm
Lead Channel Impedance Value: 549 Ohm
Lead Channel Pacing Threshold Amplitude: 0.625 V
Lead Channel Pacing Threshold Amplitude: 0.75 V
Lead Channel Pacing Threshold Pulse Width: 0.4 ms
Lead Channel Pacing Threshold Pulse Width: 0.4 ms
Lead Channel Setting Pacing Amplitude: 2 V
Lead Channel Setting Pacing Amplitude: 2.5 V
Lead Channel Setting Pacing Pulse Width: 0.4 ms
Lead Channel Setting Sensing Sensitivity: 2 mV

## 2016-10-20 ENCOUNTER — Other Ambulatory Visit: Payer: Self-pay | Admitting: Internal Medicine

## 2016-11-05 ENCOUNTER — Other Ambulatory Visit: Payer: Self-pay | Admitting: Internal Medicine

## 2016-11-14 ENCOUNTER — Encounter: Payer: Self-pay | Admitting: Physician Assistant

## 2016-11-19 ENCOUNTER — Other Ambulatory Visit: Payer: Self-pay | Admitting: Internal Medicine

## 2016-11-19 DIAGNOSIS — Z1231 Encounter for screening mammogram for malignant neoplasm of breast: Secondary | ICD-10-CM

## 2016-11-22 ENCOUNTER — Ambulatory Visit
Admission: RE | Admit: 2016-11-22 | Discharge: 2016-11-22 | Disposition: A | Discharge status: Home / Self Care | Payer: Medicare Other | Source: Ambulatory Visit | Attending: Internal Medicine | Admitting: Internal Medicine

## 2016-11-22 DIAGNOSIS — Z1231 Encounter for screening mammogram for malignant neoplasm of breast: Secondary | ICD-10-CM

## 2016-12-08 NOTE — Progress Notes (Signed)
Cardiology Office Note Date:  12/09/2016  Patient ID:  Erika Leach, Erika Leach Dec 03, 1950, MRN [id number] PCP:  Antonietta Jewel, MD  Cardiologist:  Dr. Caryl Comes   Chief Complaint: routine visit  History of Present Illness: Erika Leach is a 66 y.o. female with history of CHB w/PPM, HTN, asthma, comes today to be seen for Dr. Caryl Comes, last seen by him June 2017, at that time struggling with SOB, had abn sleep study but unable to tolerate CPAP, mentioned also abnormal PFT/COPD, mentioned HFpEF though felt to be euvolemic at that viist.    The patient feels well, she again mentions SOB, feels like she gets winded with exertion, this is unchanged now for years, recalls feeling this as far back as when her PPM was placed, thought it would get better but didn't.  Definately goes back to the time of her last echo/cath.  She denies any symptoms of PND or othopnea, says she sleeps very well.  No CP, palpitations, no dizziness, near syncope or syncope.  She does not walk or do any kind of routine exercise.  She did see pulmonary after her PFTs who confirmed for her asthma/COPD, suggested she try a different daily inhaler that did not seem to make any difference and is back on the original regime.  10/14 Cath>> normal LV function and arteries. Notably however her LVEDP was 18.  Stress echo 1/15 was negative. There is also no exercise associated MR   LV function at that time remained at the lower limits of normal.   Device information MDT dual chamber PPM, implanted 06/24/12, Dr. Lovena Le, [place], follows w/Dr. Caryl Comes   Past Medical History:  Diagnosis Date  . Asthma   . Chest pain 2014   Cath 10/14>>normal CA  . Complete heart block ([place]) 06/24/2012  . HTN (hypertension)    poorly controlled  . 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    Past Surgical History:  Procedure Laterality Date  . APPENDECTOMY    . LEFT HEART CATHETERIZATION WITH CORONARY ANGIOGRAM N/A 04/30/2013   Procedure: LEFT HEART CATHETERIZATION WITH  CORONARY ANGIOGRAM;  Surgeon: Peter M Martinique, MD;  Location: [facility] CATH LAB;  Service: Cardiovascular;  Laterality: N/A;  . PERMANENT PACEMAKER INSERTION N/A 06/24/2012   Procedure: PERMANENT PACEMAKER INSERTION;  Surgeon: Deboraha Sprang, MD;  Location: [facility] CATH LAB;  Service: Cardiovascular;  Laterality: N/A;  . TUBAL LIGATION      Current Outpatient Prescriptions  Medication Sig Dispense Refill  . albuterol (PROAIR HFA) 108 (90 BASE) MCG/ACT inhaler Inhale 2 puffs into the lungs every 6 (six) hours as needed for wheezing or shortness of breath.     . cloNIDine (CATAPRES) 0.1 MG tablet Take 0.1 mg by mouth 2 (two) times daily.    Marland Kitchen glipiZIDE (GLUCOTROL) 5 MG tablet Take 5 mg by mouth daily before breakfast.    . Glycopyrrolate-Formoterol (BEVESPI AEROSPHERE) 9-4.8 MCG/ACT AERO Inhale 2 puffs into the lungs 2 (two) times daily. 1 Inhaler 11  . lovastatin (MEVACOR) 20 MG tablet Take 20 mg by mouth at bedtime.    . traMADol (ULTRAM) 50 MG tablet Take 50-100 mg by mouth daily as needed for pain.     . valsartan-hydrochlorothiazide (DIOVAN HCT) 160-12.5 MG tablet Take 1 tablet by mouth daily. 30 tablet 11  . Vitamin D, Ergocalciferol, (DRISDOL) 50000 units CAPS capsule TAKE ONE CAPSULE BY MOUTH EACH WEEK  6  . metFORMIN (GLUCOPHAGE) 500 MG tablet Take 500 mg by mouth 2 (two) times daily. TAKE TWO TABLETS  IN THE  AM AND ONE TABLET  IN THE PM  11   No current facility-administered medications for this visit.     Allergies:   Patient has no known allergies.   Social History:  The patient  reports that she quit smoking about 4 years ago. Her smoking use included Cigarettes. She has a 40.00 pack-year smoking history. She has never used smokeless tobacco. She reports that she does not drink alcohol or use drugs.   Family History:  The patient's family history includes Breast cancer in her sister; Emphysema in her brother; Heart attack in her brother and mother; Ovarian cancer in her sister; Pneumonia in  her father.  ROS:  Please see the history of present illness.  All other systems are reviewed and otherwise negative.   PHYSICAL EXAM:  VS:  BP 115/74   Pulse 69   Ht 5\' 5"  (1.651 m)   Wt 204 lb (92.5 kg)   BMI 33.95 kg/m  BMI: Body mass index is 33.95 kg/m. Well nourished, well developed, in no acute distress  HEENT: normocephalic, atraumatic  Neck: no JVD, carotid bruits or masses Cardiac:  RRR; no significant murmurs, no rubs, or gallops Lungs:  CTA b/l,  no wheezing, rhonchi or rales  Abd: soft, nontender MS: no deformity or atrophy Ext: no edema  Skin: warm and dry, no rash Neuro:  No gross deficits appreciated Psych: euthymic mood, full affect  PPM site is stable, no tethering or discomfort   PPM interrogation done today and reviewed by myself: battery and lead testing ar egood, no high A or V rate episodes 67% AS/VP (96% V paced total)  Recent Labs: No results found for requested labs within last 8760 hours.  No results found for requested labs within last 8760 hours.   CrCl cannot be calculated (Patient's most recent lab result is older than the maximum 21 days allowed.).   Wt Readings from Last 3 Encounters:  12/09/16 204 lb (92.5 kg)  12/08/15 200 lb 9.6 oz (91 kg)  11/29/15 200 lb (90.7 kg)     Other studies reviewed: Additional studies/records reviewed today include: summarized above  ASSESSMENT AND PLAN:   1. PPM     Intact device function   2. HTN     Looks OK, no changes  3. HFpEF     Exam today does not suggest fluid OL     4. Chronic SOB unchanged for years     Likely multifactorial, sedentary, weight, COPD/asthma  Encouraged her to revisit with pulmonary, as well as weight loss and start walking for exercise    Disposition: F/u with 3 month remote pacer check and 6 months RTC, sooner if needed.  Should she feel well and with exercise and pulmonary f/u she feels well, at her baseline can move her visit to 1 year.  Current medicines are  reviewed at length with the patient today.  The patient did not have any concerns regarding medicines.  Haywood Lasso, PA-C 12/09/2016 3:18 PM     [place] [address] [phone] (office)  [phone] (fax)

## 2016-12-09 ENCOUNTER — Ambulatory Visit (INDEPENDENT_AMBULATORY_CARE_PROVIDER_SITE_OTHER): Payer: Medicare Other | Admitting: Physician Assistant

## 2016-12-09 VITALS — BP 115/74 | HR 69 | Ht 65.0 in | Wt 204.0 lb

## 2016-12-09 DIAGNOSIS — Z950 Presence of cardiac pacemaker: Secondary | ICD-10-CM

## 2016-12-09 DIAGNOSIS — I5030 Unspecified diastolic (congestive) heart failure: Secondary | ICD-10-CM

## 2016-12-09 DIAGNOSIS — I10 Essential (primary) hypertension: Secondary | ICD-10-CM | POA: Diagnosis not present

## 2016-12-09 DIAGNOSIS — I442 Atrioventricular block, complete: Secondary | ICD-10-CM | POA: Diagnosis not present

## 2016-12-09 NOTE — Patient Instructions (Addendum)
Medication Instructions:   Your physician recommends that you continue on your current medications as directed. Please refer to the Current Medication list given to you today.   If you need a refill on your cardiac medications before your next appointment, please call your pharmacy.  Labwork: NONE ORDERED  TODAY    Testing/Procedures: NONE ORDERED  TODAY '   Follow-Up: Your physician wants you to follow-up in:  [place] will receive a reminder letter in the mail two months in advance. If you don't receive a letter, please call our office to schedule the follow-up appointment.   Remote monitoring is used to monitor your Pacemaker of ICD from home. This monitoring reduces the number of office visits required to check your device to one time per year. It allows [place] to keep an eye on the functioning of your device to ensure it is working properly. You are scheduled for a device check from home on .  03-11-17 You may send your transmission at any time that day. If you have a wireless device, the transmission will be sent automatically. After your physician reviews your transmission, you will receive a postcard with your next transmission date.     Any Other Special Instructions Will Be Listed Below (If Applicable).

## 2017-05-07 ENCOUNTER — Ambulatory Visit: Payer: Self-pay | Admitting: Internal Medicine

## 2017-05-09 ENCOUNTER — Ambulatory Visit: Payer: Self-pay | Admitting: Internal Medicine

## 2017-06-12 ENCOUNTER — Encounter: Payer: Self-pay | Admitting: *Deleted

## 2017-06-15 NOTE — Progress Notes (Deleted)
Cardiology Office Note Date:  06/15/2017  Patient ID:  Erika Leach, Erika Leach [birth year], MRN [id number] PCP:  Antonietta Jewel, MD  Cardiologist:  Dr. Caryl Comes   Chief Complaint: routine visit  History of Present Illness: Erika Leach is a 66 y.o. female with history of CHB w/PPM, HTN, asthma, comes today to be seen for Dr. Caryl Comes, last seen by him June 2017, at that time struggling with SOB, had abn sleep study but unable to tolerate CPAP, mentioned also abnormal PFT/COPD, mentioned HFpEF though felt to be euvolemic at that viist.    The patient was seen by myself in follow up June 2018, at that visit she feelt well, she again mentioned SOB, felt like she gets winded with exertion, this was reported as unchanged for years, recalled feeling this as far back as when her PPM was placed, thought it would get better but didn't.  Definately goes back to the time of her last echo/cath.  She denied any symptoms of PND or othopnea, says she sleeptvery well.  No CP, palpitations, no dizziness, near syncope or syncope.  She was not walking or doing any kind of routine exercise.  She had seen pulmonary after her PFTs who confirmed for her asthma/COPD, suggested she try a different daily inhaler that did not seem to make any difference and is back on the original regime.  10/14 Cath>> normal LV function and arteries. Notably however her LVEDP was 18.  Stress echo 1/15 was negative. There is also no exercise associated MR   LV function at that time remained at the lower limits of normal.  She comes in today for 6 month follow up.  *** symptoms *** SOB/baseline *** meds *** ;labs/lipids *** fluid status   Device information MDT dual chamber PPM, implanted 06/24/12, Dr. Lovena Le, [place], follows w/Dr. Caryl Comes   Past Medical History:  Diagnosis Date  . Asthma   . Chest pain 2014   Cath 10/14>>normal CA  . Complete heart block ([place]) 06/24/2012  . HTN (hypertension)    poorly controlled  . Pacemaker-[employer]  09/29/2012    Past Surgical History:  Procedure Laterality Date  . APPENDECTOMY    . LEFT HEART CATHETERIZATION WITH CORONARY ANGIOGRAM N/A 04/30/2013   Procedure: LEFT HEART CATHETERIZATION WITH CORONARY ANGIOGRAM;  Surgeon: Peter M Martinique, MD;  Location: [facility] CATH LAB;  Service: Cardiovascular;  Laterality: N/A;  . PERMANENT PACEMAKER INSERTION N/A 06/24/2012   Procedure: PERMANENT PACEMAKER INSERTION;  Surgeon: Deboraha Sprang, MD;  Location[facility] C[facility] LAB;  Service: Cardiovascular;  Laterality: N/A;  . TUBAL LIGATION      Current Outpatient Medications  Medication Sig Dispense Refill  . albuterol (PROAIR HFA) 108 (90 BASE) MCG/ACT inhaler Inhale 2 puffs into the lungs every 6 (six) hours as needed for wheezing or shortness of breath.     . cloNIDine (CATAPRES) 0.1 MG tablet Take 0.1 mg by mouth 2 (two) times daily.    Marland Kitchen glipiZIDE (GLUCOTROL) 5 MG tablet Take 5 mg by mouth daily before breakfast.    . Glycopyrrolate-Formoterol (BEVESPI AEROSPHERE) 9-4.8 MCG/ACT AERO Inhale 2 puffs into the lungs 2 (two) times daily. 1 Inhaler 11  . lovastatin (MEVACOR) 20 MG tablet Take 20 mg by mouth at bedtime.    . metFORMIN (GLUCOPHAGE) 500 MG tablet Take 500 mg by mouth 2 (two) times daily. TAKE TWO TABLETS  IN THE AM AND ONE TABLET  IN THE PM  11  . traMADol (ULTRAM) 50 MG tablet Take 50-100 mg by  mouth daily as needed for pain.     . valsartan-hydrochlorothiazide (DIOVAN HCT) 160-12.5 MG tablet Take 1 tablet by mouth daily. 30 tablet 11  . Vitamin D, Ergocalciferol, (DRISDOL) 50000 units CAPS capsule TAKE ONE CAPSULE BY MOUTH EACH WEEK  6   No current facility-administered medications for this visit.     Allergies:   Patient has no known allergies.   Social History:  The patient  reports that she quit smoking about 4 years ago. Her smoking use included cigarettes. She has a 40.00 pack-year smoking history. she has never used smokeless tobacco. She reports that she does not drink alcohol or use  drugs.   Family History:  The patient's family history includes Breast cancer in her sister; Emphysema in her brother; Heart attack in her brother and mother; Ovarian cancer in her sister; Pneumonia in her father.  ROS:  Please see the history of present illness.  All other systems are reviewed and otherwise negative.   PHYSICAL EXAM:  VS:  There were no vitals taken for this visit. BMI: There is no height or weight on file to calculate BMI. Well nourished, well developed, in no acute distress  HEENT: normocephalic, atraumatic  Neck: no JVD, carotid bruits or masses Cardiac:  *** RRR; no significant murmurs, no rubs, or gallops Lungs:  *** CTA b/l,  no wheezing, rhonchi or rales  Abd: soft, nontender MS: no deformity or ***  atrophy Ext: *** no edema  Skin: warm and dry, no rash Neuro:  No gross deficits appreciated Psych: euthymic mood, full affect  PPM site is stable, no tethering or discomfort  EKG done today and reviewed by myself: *** PPM interrogation done today and reviewed by myself: ***   *** battery and lead testing ar egood, no high A or V rate episodes 67% AS/VP (96% V paced total)  Recent Labs: No results found for requested labs within last 8760 hours.  No results found for requested labs within last 8760 hours.   CrCl cannot be calculated (Patient's most recent lab result is older than the maximum 21 days allowed.).   Wt Readings from Last 3 Encounters:  12/09/16 204 lb (92.5 kg)  12/08/15 200 lb 9.6 oz (91 kg)  11/29/15 200 lb (90.7 kg)     Other studies reviewed: Additional studies/records reviewed today include: summarized above  ASSESSMENT AND PLAN:   1. PPM     *** Intact device function   2. HTN     *** Looks OK, no changes  3. HFpEF     *** Exam today does not suggest fluid OL     4. Chronic SOB unchanged for years     *** Likely multifactorial, sedentary, weight, COPD/asthma  *** Encouraged her to revisit with pulmonary, as well as  weight loss and start walking for exercise    Disposition:***   Current medicines are reviewed at length with the patient today.  The patient did not have any concerns regarding medicines.  Haywood Lasso, PA-C 06/15/2017 1:55 PM     [place] [address] [phone] (office)  [phone] (fax)

## 2017-06-18 ENCOUNTER — Encounter: Payer: Medicare Other | Admitting: Physician Assistant

## 2017-07-08 NOTE — Progress Notes (Signed)
Cardiology Office Note Date:  07/10/2017  Patient ID:  Erika Leach, Erika Leach 31-Jan-1951, MRN [id number] PCP:  Antonietta Jewel, MD  Cardiologist:  Dr. Caryl Comes   Chief Complaint: routine visit  History of Present Illness: Erika Leach is a 67 y.o. female with history of CHB w/PPM, HTN, asthma, comes today to be seen for Dr. Caryl Comes, last seen by him June 2017, at that time struggling with SOB, had abn sleep study but unable to tolerate CPAP, mentioned also abnormal PFT/COPD, mentioned HFpEF though felt to be euvolemic at that viist.    The patient was seen by myself in follow up June 2018, at that visit she feelt well, she again mentioned SOB, felt like she gets winded with exertion, this was reported as unchanged for years, recalled feeling this as far back as when her PPM was placed, thought it would get better but didn't.  Definately goes back to the time of her last echo/cath.  She denied any symptoms of PND or othopnea, says she sleeptvery well.  No CP, palpitations, no dizziness, near syncope or syncope.  She was not walking or doing any kind of routine exercise.  She had seen pulmonary after her PFTs who confirmed for her asthma/COPD, suggested she try a different daily inhaler that did not seem to make any difference and is back on the original regime.  10/14 Cath>> normal LV function and arteries. Notably however her LVEDP was 18.  Stress echo 1/15 was negative. There is also no exercise associated MR   LV function at that time remained at the lower limits of normal.  She comes in today for 6 month follow up.  She is doing well.  She remains with the same baseline DOE, none with routine daily activities, felt 2/2 to her COPD, dates back several years unchanged.  No CP, palpitations, no dizziness, near syncope or syncope.  She is struggling with b/l foot neuropathy pain, her PMD has started her on gabapentin but night time burning pain persists.  She has made weight loss and regular walking a goal for  the year.   Device information MDT dual chamber PPM, implanted 06/24/12, Dr. Lovena Le, [place], follows w/Dr. Caryl Comes   Past Medical History:  Diagnosis Date  . Asthma   . Chest pain 2014   Cath 10/14>>normal CA  . Complete heart block ([place]) 06/24/2012  . HTN (hypertension)    poorly controlled  . 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    Past Surgical History:  Procedure Laterality Date  . APPENDECTOMY    . LEFT HEART CATHETERIZATION WITH CORONARY ANGIOGRAM N/A 04/30/2013   Procedure: LEFT HEART CATHETERIZATION WITH CORONARY ANGIOGRAM;  Surgeon: Erika M Martinique, MD;  Location: [facility] CATH LAB;  Service: Cardiovascular;  Laterality: N/A;  . PERMANENT PACEMAKER INSERTION N/A 06/24/2012   Procedure: PERMANENT PACEMAKER INSERTION;  Surgeon: Erika Sprang, MD;  Location: [facility] CATH LAB;  Service: Cardiovascular;  Laterality: N/A;  . TUBAL LIGATION      Current Outpatient Medications  Medication Sig Dispense Refill  . albuterol (PROAIR HFA) 108 (90 BASE) MCG/ACT inhaler Inhale 2 puffs into the lungs every 6 (six) hours as needed for wheezing or shortness of breath.     Marland Kitchen amitriptyline (ELAVIL) 25 MG tablet Take 25 mg by mouth at bedtime.  3  . amLODipine (NORVASC) 10 MG tablet Take 10 mg by mouth daily.  3  . amoxicillin (AMOXIL) 500 MG capsule Before dental procedures  1  . atenolol (TENORMIN) 50 MG tablet Take 50 mg  by mouth 2 (two) times daily.  11  . cloNIDine (CATAPRES) 0.1 MG tablet Take 0.1 mg by mouth 2 (two) times daily.    Marland Kitchen gabapentin (NEURONTIN) 300 MG capsule Take 1 capsule by mouth 3 (three) times daily.  11  . glipiZIDE (GLUCOTROL) 5 MG tablet Take 5 mg by mouth daily before breakfast.    . Glycopyrrolate-Formoterol (BEVESPI AEROSPHERE) 9-4.8 MCG/ACT AERO Inhale 2 puffs into the lungs 2 (two) times daily. 1 Inhaler 11  . hydrochlorothiazide (HYDRODIURIL) 12.5 MG tablet Take 12.5 mg by mouth every morning.  11  . lovastatin (MEVACOR) 20 MG tablet Take 20 mg by mouth at bedtime.    .  meloxicam (MOBIC) 7.5 MG tablet Take 7.5 mg by mouth 2 (two) times daily as needed.  3  . metFORMIN (GLUCOPHAGE) 500 MG tablet Take 500 mg by mouth 2 (two) times daily. TAKE TWO TABLETS  IN THE AM AND ONE TABLET  IN THE PM  11  . traMADol (ULTRAM) 50 MG tablet Take 50-100 mg by mouth daily as needed for pain.     . valsartan-hydrochlorothiazide (DIOVAN HCT) 160-12.5 MG tablet Take 1 tablet by mouth daily. 30 tablet 11  . Vitamin D, Ergocalciferol, (DRISDOL) 50000 units CAPS capsule TAKE ONE CAPSULE BY MOUTH EACH WEEK  6   No current facility-administered medications for this visit.     Allergies:   Patient has no known allergies.   Social History:  The patient  reports that she quit smoking about 5 years ago. Her smoking use included cigarettes. She has a 40.00 pack-year smoking history. she has never used smokeless tobacco. She reports that she does not drink alcohol or use drugs.   Family History:  The patient's family history includes Breast cancer in her sister; Emphysema in her brother; Heart attack in her brother and mother; Ovarian cancer in her sister; Pneumonia in her father.  ROS:  Please see the history of present illness.  All other systems are reviewed and otherwise negative.   PHYSICAL EXAM:  VS:  BP 128/70   Pulse 70   Ht 5\' 5"  (1.651 m)   Wt 200 lb (90.7 kg)   SpO2 99%   BMI 33.28 kg/m  BMI: Body mass index is 33.28 kg/m. Well nourished, well developed, in no acute distress  HEENT: normocephalic, atraumatic  Neck: no JVD, carotid bruits or masses Cardiac:  RRR; no significant murmurs, no rubs, or gallops Lungs:  CTA b/l,  no wheezing, rhonchi or rales  Abd: soft, non-tender, obese MS: no deformity or atrophy Ext: no edema  Skin: warm and dry, no rash Neuro:  No gross deficits appreciated Psych: euthymic mood, full affect  PPM site is stable, no tethering or discomfort  EKG done today and reviewed by myself: AP paced, V sensed/fuse PPM interrogation done today  and reviewed by myself: battery and lead measurements are good.  HVR reviewed, one is NSVT 8 beats in October, the others brief AT, one AHR is PAFib, 4 3 minutes She has underlying rhythm today, is SR 60's    Recent Labs: No results found for requested labs within last 8760 hours.  No results found for requested labs within last 8760 hours.   CrCl cannot be calculated (Patient's most recent lab result is older than the maximum 21 days allowed.).   Wt Readings from Last 3 Encounters:  07/10/17 200 lb (90.7 kg)  12/09/16 204 lb (92.5 kg)  12/08/15 200 lb 9.6 oz (91 kg)  Other studies reviewed: Additional studies/records reviewed today include: summarized above  ASSESSMENT AND PLAN:   1. PPM     Intact device function   2. HTN     Looks OK, no changes  3. HFpEF     Exam today again does not suggest fluid OL     4. Chronic SOB unchanged for years     Likely multifactorial, sedentary, weight, COPD/asthma     She is encouraged to start walking as planned and weight loss  5. Very short 3 minutes PAF in July     Follow via device for now  6. One NSVT, October     No symptoms     Update echo    Disposition: Will have her back in 61months, sooner if needed.  Current medicines are reviewed at length with the patient today.  The patient did not have any concerns regarding medicines.  Haywood Lasso, PA-C 07/10/2017 1:51 PM     [address] [place] [address] [phone] (office)  [phone] (fax)

## 2017-07-10 ENCOUNTER — Ambulatory Visit (INDEPENDENT_AMBULATORY_CARE_PROVIDER_SITE_OTHER): Payer: Medicare Other | Admitting: Physician Assistant

## 2017-07-10 ENCOUNTER — Encounter: Payer: Self-pay | Admitting: Physician Assistant

## 2017-07-10 VITALS — BP 128/70 | HR 70 | Ht 65.0 in | Wt 200.0 lb

## 2017-07-10 DIAGNOSIS — I4729 Other ventricular tachycardia: Secondary | ICD-10-CM

## 2017-07-10 DIAGNOSIS — I5030 Unspecified diastolic (congestive) heart failure: Secondary | ICD-10-CM

## 2017-07-10 DIAGNOSIS — Z950 Presence of cardiac pacemaker: Secondary | ICD-10-CM

## 2017-07-10 DIAGNOSIS — I10 Essential (primary) hypertension: Secondary | ICD-10-CM | POA: Diagnosis not present

## 2017-07-10 DIAGNOSIS — I472 Ventricular tachycardia: Secondary | ICD-10-CM

## 2017-07-10 NOTE — Patient Instructions (Addendum)
Medication Instructions:   Your physician recommends that you continue on your current medications as directed. Please refer to the Current Medication list given to you today.   If you need a refill on your cardiac medications before your next appointment, please call your pharmacy.  Labwork: NONE ORDERED  TODAY   Testing/Procedures: Your physician has requested that you have an echocardiogram. Echocardiography is a painless test that uses sound waves to create images of your heart. It provides your doctor with information about the size and shape of your heart and how well your heart's chambers and valves are working. This procedure takes approximately one hour. There are no restrictions for this procedure.     Follow-Up:  IN 3 MONTHS WITH URSUY      Any Other Special Instructions Will Be Listed Below (If Applicable).

## 2017-07-14 ENCOUNTER — Ambulatory Visit (HOSPITAL_COMMUNITY): Payer: Medicare Other | Attending: Cardiology

## 2017-07-14 ENCOUNTER — Other Ambulatory Visit: Payer: Self-pay

## 2017-07-14 DIAGNOSIS — Z950 Presence of cardiac pacemaker: Secondary | ICD-10-CM | POA: Diagnosis not present

## 2017-07-14 DIAGNOSIS — I5030 Unspecified diastolic (congestive) heart failure: Secondary | ICD-10-CM | POA: Diagnosis not present

## 2017-07-14 DIAGNOSIS — J449 Chronic obstructive pulmonary disease, unspecified: Secondary | ICD-10-CM | POA: Insufficient documentation

## 2017-07-14 DIAGNOSIS — Z720 Tobacco use: Secondary | ICD-10-CM | POA: Insufficient documentation

## 2017-07-14 DIAGNOSIS — I480 Paroxysmal atrial fibrillation: Secondary | ICD-10-CM | POA: Insufficient documentation

## 2017-07-14 DIAGNOSIS — Z6833 Body mass index (BMI) 33.0-33.9, adult: Secondary | ICD-10-CM | POA: Insufficient documentation

## 2017-07-14 DIAGNOSIS — I472 Ventricular tachycardia: Secondary | ICD-10-CM | POA: Diagnosis not present

## 2017-07-14 DIAGNOSIS — E785 Hyperlipidemia, unspecified: Secondary | ICD-10-CM | POA: Diagnosis not present

## 2017-07-14 DIAGNOSIS — I4729 Other ventricular tachycardia: Secondary | ICD-10-CM

## 2017-07-14 DIAGNOSIS — I110 Hypertensive heart disease with heart failure: Secondary | ICD-10-CM | POA: Insufficient documentation

## 2017-07-14 DIAGNOSIS — I4891 Unspecified atrial fibrillation: Secondary | ICD-10-CM | POA: Diagnosis present

## 2017-07-14 DIAGNOSIS — E669 Obesity, unspecified: Secondary | ICD-10-CM | POA: Insufficient documentation

## 2017-07-24 ENCOUNTER — Encounter: Payer: Self-pay | Admitting: Physician Assistant

## 2017-08-19 ENCOUNTER — Encounter: Payer: Medicare Other | Admitting: Physician Assistant

## 2017-09-01 NOTE — Progress Notes (Signed)
Cardiology Office Note Date:  09/02/2017  Patient ID:  Erika Leach, Erika Leach 1951-06-24, MRN [id number] PCP:  Antonietta Jewel, MD  Cardiologist:  Dr. Caryl Comes     Chief Complaint: planned f/u visit  History of Present Illness: Erika Leach is a 67 y.o. female with history of CHB w/PPM, HTN, asthma, comes today to be seen for Dr. Caryl Comes, last seen by him June 2017, at that time struggling with SOB, had abn sleep study but unable to tolerate CPAP, mentioned also abnormal PFT/COPD, mentioned HFpEF though felt to be euvolemic at that viist.    The patient was seen by myself in follow up June 2018, at that visit she feelt well, she again mentioned SOB, felt like she gets winded with exertion, this was reported as unchanged for years, recalled feeling this as far back as when her PPM was placed, thought it would get better but didn't.  Definately goes back to the time of her last echo/cath.  She denied any symptoms of PND or othopnea, says she sleeptvery well.  No CP, palpitations, no dizziness, near syncope or syncope.  She was not walking or doing any kind of routine exercise.  She had seen pulmonary after her PFTs who confirmed for her asthma/COPD, suggested she try a different daily inhaler that did not seem to make any difference and is back on the original regime.  10/14 Cath>> normal LV function and arteries. Notably however her LVEDP was 18.  Stress echo 1/15 was negative. There is also no exercise associated MR   LV function at that time remained at the lower limits of normal.  She comes in today for a 3 month follow up.  She was seen by myself in January, she was doing well.  Remained with the same baseline DOE, none with routine daily activities, felt 2/2 to her COPD, dated back several years unchanged.  No CP, palpitations, no dizziness, near syncope or syncope.  She was struggling with b/l foot neuropathy pain, her PMD had started her on gabapentin but night time burning pain persisted.  She was  working on weight loss and regular walking a goal for the year.  She was noted on her device check to have had HVR episodes, NSVT 8 beats in October, the others brief AT, one AHR is PAFib, 4, 3 minutes in July, echo was updated with LVEF 60-65%, LVH. P.HTN  She is feeling quite well.  No CP, palpitations, no dizziness, near syncope or syncope.  Her baseline SOB is minimal she reports, does not limit her ability to do her ADL's, is feeling well.  Device information MDT dual chamber PPM, implanted 06/24/12, Dr. Lovena Le, [place], follows w/Dr. Caryl Comes   Past Medical History:  Diagnosis Date  . Asthma   . Chest pain 2014   Cath 10/14>>normal CA  . Complete heart block ([place]) 06/24/2012  . HTN (hypertension)    poorly controlled  . 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    Past Surgical History:  Procedure Laterality Date  . APPENDECTOMY    . LEFT HEART CATHETERIZATION WITH CORONARY ANGIOGRAM N/A 04/30/2013   Procedure: LEFT HEART CATHETERIZATION WITH CORONARY ANGIOGRAM;  Surgeon: Peter M Martinique, MD;  Location: [facility] CATH LAB;  Service: Cardiovascular;  Laterality: N/A;  . PERMANENT PACEMAKER INSERTION N/A 06/24/2012   Procedure: PERMANENT PACEMAKER INSERTION;  Surgeon: Deboraha Sprang, MD;  Location: [facility] CATH LAB;  Service: Cardiovascular;  Laterality: N/A;  . TUBAL LIGATION      Current Outpatient Medications  Medication Sig Dispense  Refill  . albuterol (PROAIR HFA) 108 (90 BASE) MCG/ACT inhaler Inhale 2 puffs into the lungs every 6 (six) hours as needed for wheezing or shortness of breath.     Marland Kitchen amitriptyline (ELAVIL) 25 MG tablet Take 25 mg by mouth at bedtime.  3  . amLODipine (NORVASC) 10 MG tablet Take 10 mg by mouth daily.  3  . amoxicillin (AMOXIL) 500 MG capsule Before dental procedures  1  . atenolol (TENORMIN) 50 MG tablet Take 50 mg by mouth 2 (two) times daily.  11  . cloNIDine (CATAPRES) 0.1 MG tablet Take 0.1 mg by mouth 2 (two) times daily.    Marland Kitchen gabapentin (NEURONTIN) 300 MG capsule Take  1 capsule by mouth 3 (three) times daily.  11  . glipiZIDE (GLUCOTROL) 5 MG tablet Take 5 mg by mouth daily before breakfast.    . Glycopyrrolate-Formoterol (BEVESPI AEROSPHERE) 9-4.8 MCG/ACT AERO Inhale 2 puffs into the lungs 2 (two) times daily. 1 Inhaler 11  . hydrochlorothiazide (HYDRODIURIL) 12.5 MG tablet Take 12.5 mg by mouth every morning.  11  . lovastatin (MEVACOR) 20 MG tablet Take 20 mg by mouth at bedtime.    . meloxicam (MOBIC) 7.5 MG tablet Take 7.5 mg by mouth 2 (two) times daily as needed.  3  . metFORMIN (GLUCOPHAGE) 500 MG tablet Take 500 mg by mouth 2 (two) times daily. TAKE TWO TABLETS  IN THE AM AND ONE TABLET  IN THE PM  11  . traMADol (ULTRAM) 50 MG tablet Take 50-100 mg by mouth daily as needed for pain.     . Vitamin D, Ergocalciferol, (DRISDOL) 50000 units CAPS capsule TAKE ONE CAPSULE BY MOUTH EACH WEEK  6   No current facility-administered medications for this visit.     Allergies:   Patient has no known allergies.   Social History:  The patient  reports that she quit smoking about 5 years ago. Her smoking use included cigarettes. She has a 40.00 pack-year smoking history. she has never used smokeless tobacco. She reports that she does not drink alcohol or use drugs.   Family History:  The patient's family history includes Breast cancer in her sister; Emphysema in her brother; Heart attack in her brother and mother; Ovarian cancer in her sister; Pneumonia in her father.  ROS:  Please see the history of present illness.  All other systems are reviewed and otherwise negative.   PHYSICAL EXAM:  VS:  BP 110/70   Pulse 69   Ht 5\' 5"  (1.651 m)   Wt 204 lb 6.4 oz (92.7 kg)   SpO2 91%   BMI 34.01 kg/m  BMI: Body mass index is 34.01 kg/m. Well nourished, well developed, in no acute distress  HEENT: normocephalic, atraumatic  Neck: no JVD, carotid bruits or masses Cardiac:  RRR; no significant murmurs, no rubs, or gallops Lungs:  CTA b/l,  no wheezing, rhonchi or  rales  Abd: soft, non-tender, obese MS: no deformity or atrophy Ext: no edema  Skin: warm and dry, no rash Neuro:  No gross deficits appreciated Psych: euthymic mood, full affect  PPM site is stable, no tethering or discomfort  EKG not done today PPM interrogation done today and reviewed by myself: battery and lead measurements are good, one HVR is 1:1, 5 seconds, presents AS/VS 70's   07/14/17: TTE Study Conclusions - Left ventricle: The cavity size was normal. Wall thickness was   increased in a pattern of mild LVH. Systolic function was normal.   The  estimated ejection fraction was in the range of 60% to 65%.   Doppler parameters are consistent with abnormal left ventricular   relaxation (grade 1 diastolic dysfunction). - Pulmonary arteries: Systolic pressure was moderately increased.   PA peak pressure: 42 mm Hg (S).  Recent Labs: No results found for requested labs within last 8760 hours.  No results found for requested labs within last 8760 hours.   CrCl cannot be calculated (Patient's most recent lab result is older than the maximum 21 days allowed.).   Wt Readings from Last 3 Encounters:  09/02/17 204 lb 6.4 oz (92.7 kg)  07/10/17 200 lb (90.7 kg)  12/09/16 204 lb (92.5 kg)     Other studies reviewed: Additional studies/records reviewed today include: summarized above  ASSESSMENT AND PLAN:   1. PPM     Intact device function   2. HTN     Looks OK, no changes  3. HFpEF     Exam today again does not suggest fluid OL     4. Chronic SOB unchanged for years, not limiting     Likely multifactorial, sedentary, weight, COPD/asthma     She is encouraged to start walking as planned and weight loss  5. Very short 3 minutes PAF in July 2018     None further to date      Follow via device for now  6. One NSVT, October 2018      No symptoms     Updated echo noted LVEF 60-65%     None further    Disposition: BMET today given HCTZ, Q 62mo remote device checks ,  in-clinic visit 6 mo, sooner if needed.  Current medicines are reviewed at length with the patient today.  The patient did not have any concerns regarding medicines.  Haywood Lasso, PA-C 09/02/2017 1:33 PM     [place] [address] [phone] (office)  [phone] (fax)

## 2017-09-02 ENCOUNTER — Encounter: Payer: Self-pay | Admitting: Physician Assistant

## 2017-09-02 ENCOUNTER — Ambulatory Visit (INDEPENDENT_AMBULATORY_CARE_PROVIDER_SITE_OTHER): Payer: Medicare Other | Admitting: Physician Assistant

## 2017-09-02 VITALS — BP 110/70 | HR 69 | Ht 65.0 in | Wt 204.4 lb

## 2017-09-02 DIAGNOSIS — I442 Atrioventricular block, complete: Secondary | ICD-10-CM

## 2017-09-02 DIAGNOSIS — I5030 Unspecified diastolic (congestive) heart failure: Secondary | ICD-10-CM | POA: Diagnosis not present

## 2017-09-02 DIAGNOSIS — I10 Essential (primary) hypertension: Secondary | ICD-10-CM

## 2017-09-02 DIAGNOSIS — Z950 Presence of cardiac pacemaker: Secondary | ICD-10-CM | POA: Diagnosis not present

## 2017-09-02 DIAGNOSIS — Z79899 Other long term (current) drug therapy: Secondary | ICD-10-CM

## 2017-09-02 NOTE — Patient Instructions (Addendum)
Medication Instructions:   Your physician recommends that you continue on your current medications as directed. Please refer to the Current Medication list given to you today.   If you need a refill on your cardiac medications before your next appointment, please call your pharmacy.  Labwork:  BMET TODAY    Testing/Procedures: NONE ORDERED  TODAY    Follow-Up:  Your physician wants you to follow-up in:  IN  6  MONTHS WITH DR  Dennis Bast will receive a reminder letter in the mail two months in advance. If you don't receive a letter, please call our office to schedule the follow-up appointment.    Remote monitoring is used to monitor your Pacemaker of ICD from home. This monitoring reduces the number of office visits required to check your device to one time per year. It allows [place] to keep an eye on the functioning of your device to ensure it is working properly. You are scheduled for a device check from home on . 12-02-17 You may send your transmission at any time that day. If you have a wireless device, the transmission will be sent automatically. After your physician reviews your transmission, you will receive a postcard with your next transmission date.      Any Other Special Instructions Will Be Listed Below (If Applicable).

## 2017-09-03 ENCOUNTER — Telehealth: Payer: Self-pay | Admitting: *Deleted

## 2017-09-03 LAB — BASIC METABOLIC PANEL
BUN/Creatinine Ratio: 17 (ref 12–28)
BUN: 16 mg/dL (ref 8–27)
CO2: 22 mmol/L (ref 20–29)
Calcium: 10 mg/dL (ref 8.7–10.3)
Chloride: 99 mmol/L (ref 96–106)
Creatinine, Ser: 0.92 mg/dL (ref 0.57–1.00)
GFR calc Af Amer: 75 mL/min/{1.73_m2} (ref >59)
GFR calc non Af Amer: 65 mL/min/{1.73_m2} (ref >59)
Glucose: 132 mg/dL — ABNORMAL HIGH (ref 65–99)
Potassium: 4.5 mmol/L (ref 3.5–5.2)
Sodium: 141 mmol/L (ref 134–144)

## 2017-09-03 NOTE — Telephone Encounter (Signed)
-----   Message from [facility], [place] sent at 09/03/2017 11:18 AM EST ----- Lab looks good.  No changes  Thanks renee

## 2017-09-03 NOTE — Telephone Encounter (Signed)
SPOKE TO PT AND PT AWARE OF RESULTS

## 2017-10-03 ENCOUNTER — Encounter: Payer: Medicare Other | Admitting: Physician Assistant

## 2017-10-17 ENCOUNTER — Other Ambulatory Visit: Payer: Self-pay | Admitting: Internal Medicine

## 2017-10-17 DIAGNOSIS — Z1231 Encounter for screening mammogram for malignant neoplasm of breast: Secondary | ICD-10-CM

## 2017-11-24 ENCOUNTER — Ambulatory Visit
Admission: RE | Admit: 2017-11-24 | Discharge: 2017-11-24 | Disposition: A | Discharge status: Home / Self Care | Payer: Medicare Other | Source: Ambulatory Visit | Attending: Internal Medicine | Admitting: Internal Medicine

## 2017-11-24 DIAGNOSIS — Z1231 Encounter for screening mammogram for malignant neoplasm of breast: Secondary | ICD-10-CM

## 2017-12-02 ENCOUNTER — Ambulatory Visit (INDEPENDENT_AMBULATORY_CARE_PROVIDER_SITE_OTHER): Payer: Medicare Other | Admitting: *Deleted

## 2017-12-02 DIAGNOSIS — I442 Atrioventricular block, complete: Secondary | ICD-10-CM

## 2017-12-05 ENCOUNTER — Encounter: Payer: Self-pay | Admitting: Cardiology

## 2017-12-05 NOTE — Progress Notes (Signed)
Remote pacemaker transmission.   

## 2017-12-08 LAB — CUP PACEART REMOTE DEVICE CHECK
Battery Impedance: 421 Ohm
Battery Remaining Longevity: 93 mo
Battery Voltage: 2.79 V
Brady Statistic AP VP Percent: 44 %
Brady Statistic AP VS Percent: 2 %
Brady Statistic AS VP Percent: 25 %
Brady Statistic AS VS Percent: 29 %
Date Time Interrogation Session: 20190531174412
Implantable Lead Implant Date: 20131218
Implantable Lead Implant Date: 20131218
Implantable Lead Location: 753859
Implantable Lead Location: 753860
Implantable Lead Model: 5076
Implantable Lead Model: 5076
Implantable Pulse Generator Implant Date: 20131218
Lead Channel Impedance Value: 512 Ohm
Lead Channel Impedance Value: 547 Ohm
Lead Channel Pacing Threshold Amplitude: 0.625 V
Lead Channel Pacing Threshold Amplitude: 0.75 V
Lead Channel Pacing Threshold Pulse Width: 0.4 ms
Lead Channel Pacing Threshold Pulse Width: 0.4 ms
Lead Channel Setting Pacing Amplitude: 2 V
Lead Channel Setting Pacing Amplitude: 2.5 V
Lead Channel Setting Pacing Pulse Width: 0.4 ms
Lead Channel Setting Sensing Sensitivity: 2 mV

## 2018-02-24 NOTE — Progress Notes (Signed)
Cardiology Office Note Date:  02/25/2018  Patient ID:  Erika, Leach 1951/03/17, MRN [id number] PCP:  Antonietta Jewel, MD  Cardiologist:  Dr. Caryl Comes     Chief Complaint: planned f/u visit  History of Present Illness: Erika Leach is a 67 y.o. female with history of CHB w/PPM, HTN, asthma, comes today to be seen for Dr. Caryl Comes, last seen by him June 2017, at that time struggling with SOB, had abn sleep study but unable to tolerate CPAP, mentioned also abnormal PFT/COPD, mentioned HFpEF though felt to be euvolemic at that viist.    The patient was seen by myself in follow up June 2018, at that visit she feelt well, she again mentioned SOB, felt like she gets winded with exertion, this was reported as unchanged for years, recalled feeling this as far back as when her PPM was placed, thought it would get better but didn't.  Definately goes back to the time of her last echo/cath.  She denied any symptoms of PND or othopnea, says she sleeptvery well.  No CP, palpitations, no dizziness, near syncope or syncope.  She was not walking or doing any kind of routine exercise.  She had seen pulmonary after her PFTs who confirmed for her asthma/COPD, suggested she try a different daily inhaler that did not seem to make any difference and is back on the original regime.  10/14 Cath>> normal LV function and arteries. Notably however her LVEDP was 18.  Stress echo 1/15 was negative. There is also no exercise associated MR   LV function at that time remained at the lower limits of normal.  She comes in today for a 3 month follow up.  She was seen by myself in January, she was doing well.  Remained with the same baseline DOE, none with routine daily activities, felt 2/2 to her COPD, dated back several years unchanged.  No CP, palpitations, no dizziness, near syncope or syncope.  She was struggling with b/l foot neuropathy pain, her PMD had started her on gabapentin but night time burning pain persisted.  She was  working on weight loss and regular walking a goal for the year.  She was noted on her device check to have had HVR episodes, NSVT 8 beats in October 2018, the others brief AT, one AHR is PAFib, 4, 3 minutes in July 2018, echo was updated with LVEF 60-65%, LVH. P.HTN  Follow up in Feb 2019 she was feeling quite well.  No CP, palpitations, no dizziness, near syncope or syncope.  Her baseline SOB was minimal by her report, does not limit her ability to do her ADL's.  No arrhythmias, no changes were made.  Planned for 6 mo f/u.  She comes today feeling well.  The very hot weather bothers her asthma, but outside of this she is doing very well.  She is actively trying to lose weight, dieting.  She denies any DOE inside, and on cooler days not outside either.  No CP, palpitations, no symptoms of PND or orthopnea.  No dizziness, near syncope or syncope.  Sje tells me her PMD monitors her labs/lipids.  Device information MDT dual chamber PPM, implanted 06/24/12, Dr. Lovena Le, [place], follows w/Dr. Caryl Comes   Past Medical History:  Diagnosis Date  . Asthma   . Chest pain 2014   Cath 10/14>>normal CA  . Complete heart block ([facility]) 06/24/2012  . HTN (hypertension)    poorly controlled  . 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    Past Surgical History:  Procedure  Laterality Date  . APPENDECTOMY    . LEFT HEART CATHETERIZATION WITH CORONARY ANGIOGRAM N/A 04/30/2013   Procedure: LEFT HEART CATHETERIZATION WITH CORONARY ANGIOGRAM;  Surgeon: Peter M Martinique, MD;  Location: [facility] CATH LAB;  Service: Cardiovascular;  Laterality: N/A;  . PERMANENT PACEMAKER INSERTION N/A 06/24/2012   Procedure: PERMANENT PACEMAKER INSERTION;  Surgeon: Deboraha Sprang, MD;  Location: [facility] CATH LAB;  Service: Cardiovascular;  Laterality: N/A;  . TUBAL LIGATION      Current Outpatient Medications  Medication Sig Dispense Refill  . albuterol (PROAIR HFA) 108 (90 BASE) MCG/ACT inhaler Inhale 2 puffs into the lungs every 6 (six) hours as needed for  wheezing or shortness of breath.     Marland Kitchen amitriptyline (ELAVIL) 25 MG tablet Take 25 mg by mouth at bedtime.  3  . amLODipine (NORVASC) 10 MG tablet Take 10 mg by mouth daily.  3  . amoxicillin (AMOXIL) 500 MG capsule Before dental procedures  1  . atenolol (TENORMIN) 50 MG tablet Take 50 mg by mouth 2 (two) times daily.  11  . cloNIDine (CATAPRES) 0.1 MG tablet Take 0.1 mg by mouth 2 (two) times daily.    Marland Kitchen gabapentin (NEURONTIN) 300 MG capsule Take 1 capsule by mouth 3 (three) times daily.  11  . glipiZIDE (GLUCOTROL) 5 MG tablet Take 5 mg by mouth daily before breakfast.    . Glycopyrrolate-Formoterol (BEVESPI AEROSPHERE) 9-4.8 MCG/ACT AERO Inhale 2 puffs into the lungs 2 (two) times daily. 1 Inhaler 11  . hydrochlorothiazide (HYDRODIURIL) 12.5 MG tablet Take 12.5 mg by mouth every morning.  11  . lovastatin (MEVACOR) 20 MG tablet Take 20 mg by mouth at bedtime.    . meloxicam (MOBIC) 7.5 MG tablet Take 7.5 mg by mouth 2 (two) times daily as needed.  3  . metFORMIN (GLUCOPHAGE) 500 MG tablet Take 500 mg by mouth 2 (two) times daily.  11  . traMADol (ULTRAM) 50 MG tablet Take 50-100 mg by mouth daily as needed for pain.      No current facility-administered medications for this visit.     Allergies:   Patient has no known allergies.   Social History:  The patient  reports that she quit smoking about 5 years ago. Her smoking use included cigarettes. She has a 40.00 pack-year smoking history. She has never used smokeless tobacco. She reports that she does not drink alcohol or use drugs.   Family History:  The patient's family history includes Breast cancer in her sister; Emphysema in her brother; Heart attack in her brother and mother; Ovarian cancer in her sister; Pneumonia in her father.  ROS:  Please see the history of present illness.  All other systems are reviewed and otherwise negative.   PHYSICAL EXAM:  VS:  BP 114/68   Pulse 65   Wt 197 lb (89.4 kg)   BMI 32.78 kg/m  BMI: Body  mass index is 32.78 kg/m. Well nourished, well developed, in no acute distress  HEENT: normocephalic, atraumatic  Neck: no JVD, carotid bruits or masses Cardiac:  RRR; no significant murmurs, no rubs, or gallops Lungs:  CTA b/l,  no wheezing, rhonchi or rales  Abd: soft, non-tender, obese MS: no deformity or atrophy Ext: no edema  Skin: warm and dry, no rash Neuro:  No gross deficits appreciated Psych: euthymic mood, full affect  PPM site is stable, no tethering or discomfort  EKG not done today PPM interrogation done today and reviewed by myself: battery and lead measurements are  good, one NSVT, 13 beats.  No AMS episodes.  She VP 41%.  Her sensed/intrinsic PR is 166ms and her sensed AV delay is extended to 231ms  07/14/17: TTE Study Conclusions - Left ventricle: The cavity size was normal. Wall thickness was   increased in a pattern of mild LVH. Systolic function was normal.   The estimated ejection fraction was in the range of 60% to 65%.   Doppler parameters are consistent with abnormal left ventricular   relaxation (grade 1 diastolic dysfunction). - Pulmonary arteries: Systolic pressure was moderately increased.   PA peak pressure: 42 mm Hg (S).  Recent Labs: 09/02/2017: BUN 16; Creatinine, Ser 0.92; Potassium 4.5; Sodium 141  No results found for requested labs within last 8760 hours.   CrCl cannot be calculated (Patient's most recent lab result is older than the maximum 21 days allowed.).   Wt Readings from Last 3 Encounters:  02/25/18 197 lb (89.4 kg)  09/02/17 204 lb 6.4 oz (92.7 kg)  07/10/17 200 lb (90.7 kg)     Other studies reviewed: Additional studies/records reviewed today include: summarized above  ASSESSMENT AND PLAN:   1. PPM     Intact device function   2. HTN     Looks OK, no changes  3. HFpEF     She has lost weight, no symptoms or exam findings to suggest fluid OL     4. Chronic SOB unchanged for years, not limiting, though sounds like it is  improving     Likely multifactorial, sedentary, weight, COPD/asthma     She is again encouraged to start walking in climate controlled environments, and continue her weight loss efforts  5. Very short 3 minutes PAF in July 2018     None further to date    Follow via device for now  6. One NSVT     No symptoms     Updated echo 07/2017 noted LVEF 60-65%         Disposition: continue q 3 month remotes, see her in-clinic in 1 year, sooner if needed  Current medicines are reviewed at length with the patient today.  The patient did not have any concerns regarding medicines.  Haywood Lasso, PA-C 02/25/2018 1:50 PM     [address] [place] [id number] [phone] (office)  [phone] (fax)

## 2018-02-25 ENCOUNTER — Encounter (INDEPENDENT_AMBULATORY_CARE_PROVIDER_SITE_OTHER): Payer: Self-pay

## 2018-02-25 ENCOUNTER — Ambulatory Visit (INDEPENDENT_AMBULATORY_CARE_PROVIDER_SITE_OTHER): Payer: Medicare Other | Admitting: Physician Assistant

## 2018-02-25 VITALS — BP 114/68 | HR 65 | Wt 197.0 lb

## 2018-02-25 DIAGNOSIS — I5032 Chronic diastolic (congestive) heart failure: Secondary | ICD-10-CM

## 2018-02-25 DIAGNOSIS — Z950 Presence of cardiac pacemaker: Secondary | ICD-10-CM

## 2018-02-25 DIAGNOSIS — I10 Essential (primary) hypertension: Secondary | ICD-10-CM | POA: Diagnosis not present

## 2018-02-25 DIAGNOSIS — I472 Ventricular tachycardia: Secondary | ICD-10-CM

## 2018-02-25 DIAGNOSIS — I4729 Other ventricular tachycardia: Secondary | ICD-10-CM

## 2018-02-25 DIAGNOSIS — I442 Atrioventricular block, complete: Secondary | ICD-10-CM | POA: Diagnosis not present

## 2018-02-25 NOTE — Patient Instructions (Addendum)
Medication Instructions:   Your physician recommends that you continue on your current medications as directed. Please refer to the Current Medication list given to you today.   If you need a refill on your cardiac medications before your next appointment, please call your pharmacy.  Labwork: NONE ORDERED  TODAY    Testing/Procedures: NONE ORDERED  TODAY    Follow-Up: Your physician wants you to follow-up in: [place] / [place] You will receive a reminder letter in the mail two months in advance. If you don't receive a letter, please call our office to schedule the follow-up appointment.    Remote monitoring is used to monitor your Pacemaker of ICD from home. This monitoring reduces the number of office visits required to check your device to one time per year. It allows [place] to keep an eye on the functioning of your device to ensure it is working properly. You are scheduled for a device check from home on .03/04/18 You may send your transmission at any time that day. If you have a wireless device, the transmission will be sent automatically. After your physician reviews your transmission, you will receive a postcard with your next transmission date.     Any Other Special Instructions Will Be Listed Below (If Applicable).

## 2018-03-04 ENCOUNTER — Telehealth: Payer: Self-pay

## 2018-03-04 ENCOUNTER — Encounter: Payer: Medicare Other | Admitting: *Deleted

## 2018-03-04 NOTE — Telephone Encounter (Signed)
Spoke with pt and reminded pt of remote transmission that is due today. Pt verbalized understanding.   

## 2018-03-06 ENCOUNTER — Encounter: Payer: Self-pay | Admitting: Cardiology

## 2018-03-16 ENCOUNTER — Ambulatory Visit (INDEPENDENT_AMBULATORY_CARE_PROVIDER_SITE_OTHER): Payer: Medicare Other | Admitting: *Deleted

## 2018-03-16 DIAGNOSIS — I442 Atrioventricular block, complete: Secondary | ICD-10-CM

## 2018-03-16 NOTE — Progress Notes (Signed)
Remote pacemaker transmission.   

## 2018-03-21 IMAGING — DX DG CHEST 2V
2 series · 2 of 2 positions shown · non-contrast
Comparison: 05/25/2014

CLINICAL DATA: Chronic cough, congestion, shortness of breath.
Essential hypertension. Emphysema.

EXAM:
CHEST  2 VIEW

[chest pa]
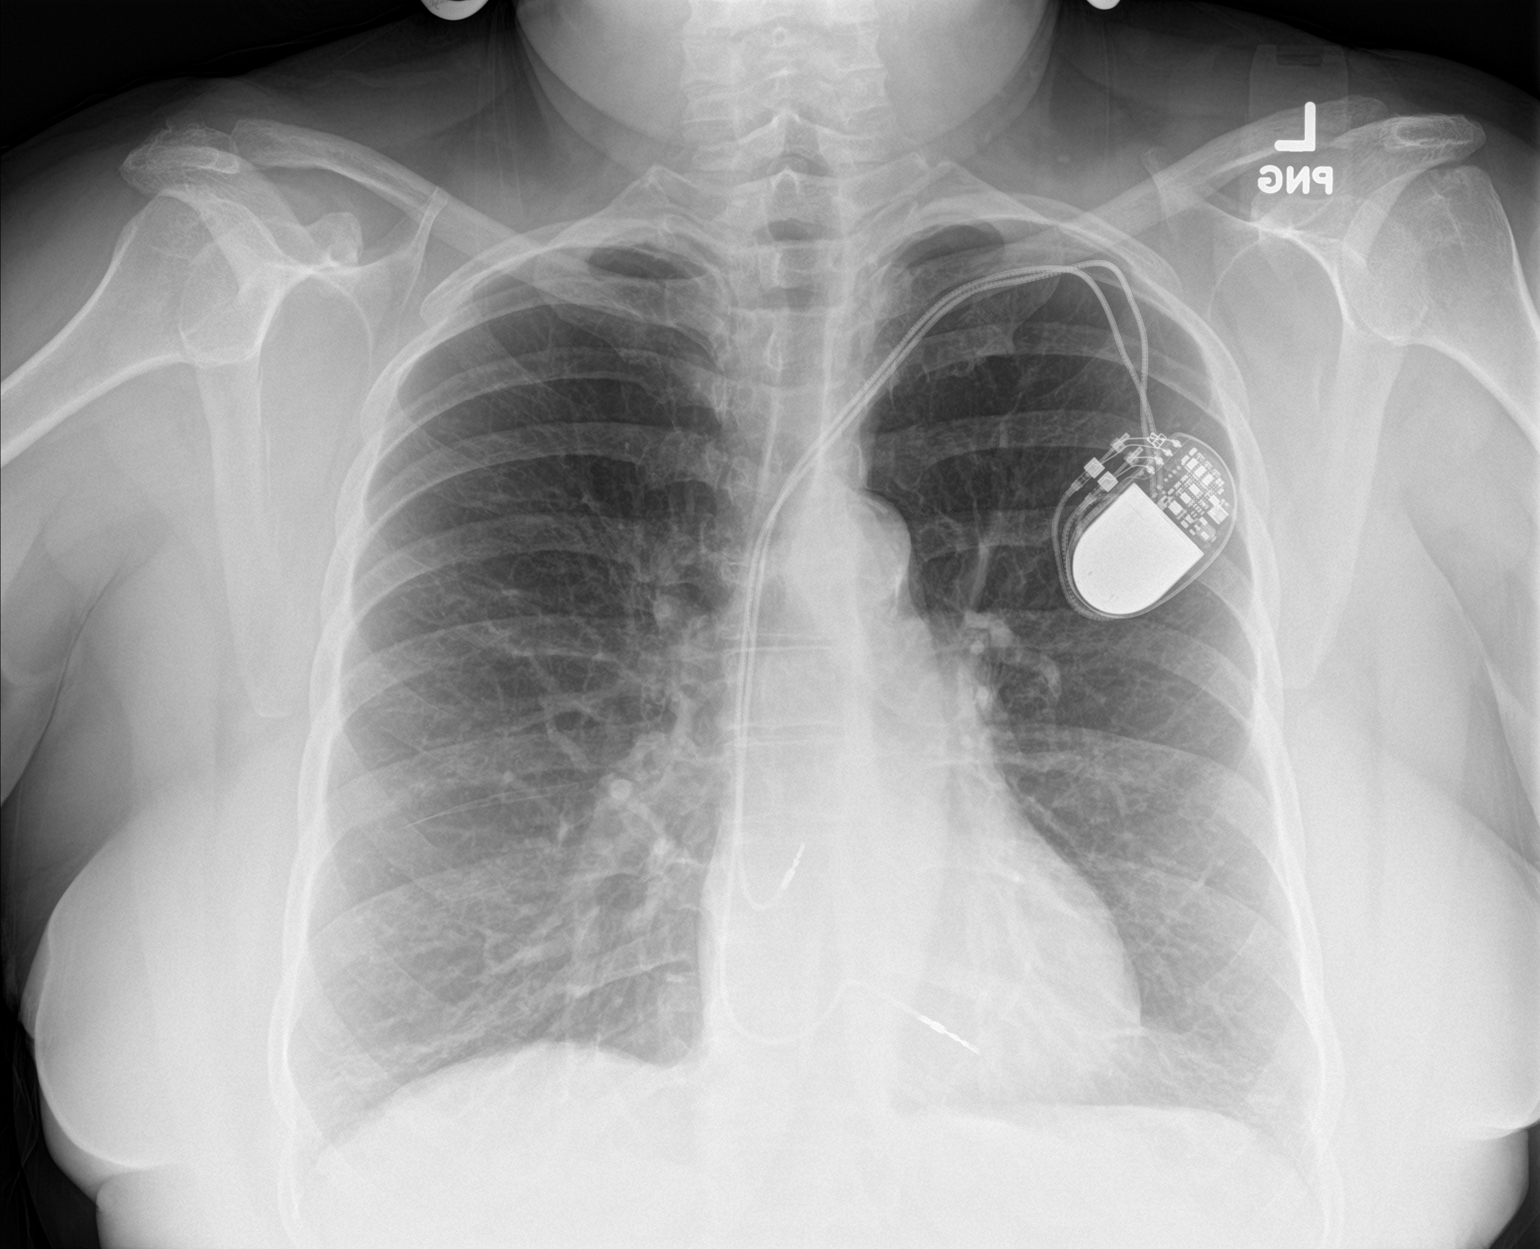

[chest lat]
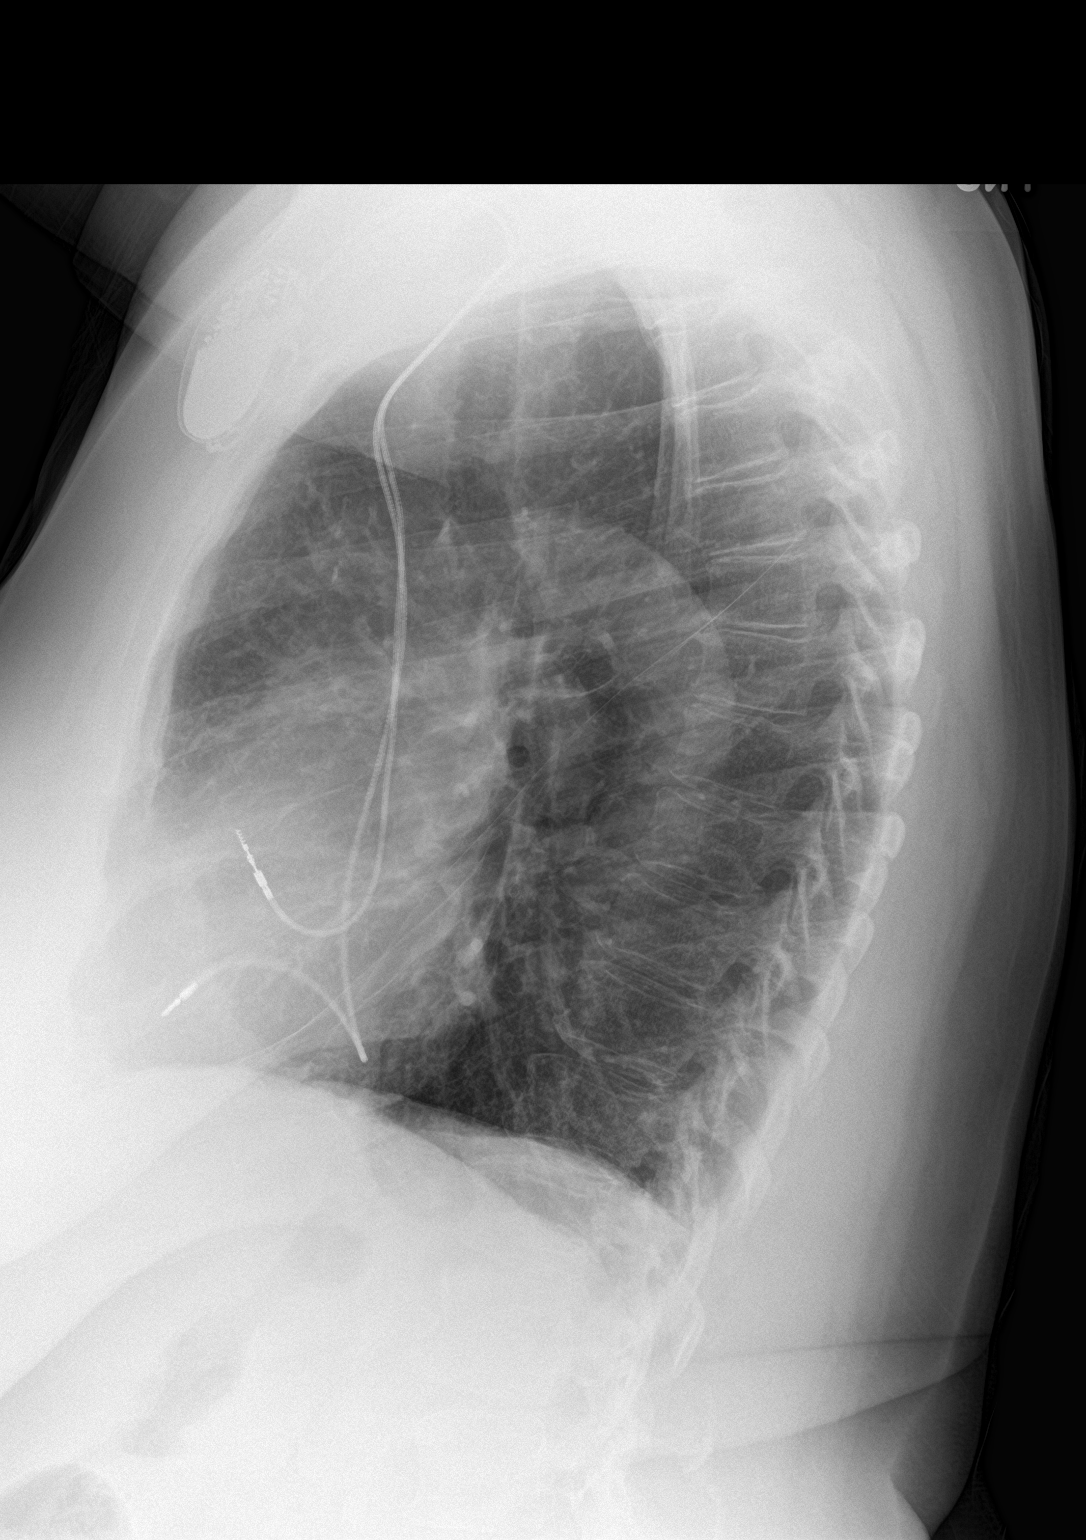

[2 of 2 positions shown; findings below may reference images not displayed]

FINDINGS: Left dual lead pacer remains in place, unchanged. There is
hyperinflation of the lungs compatible with COPD. Heart and
mediastinal contours are within normal limits. No focal opacities or
effusions. No acute bony abnormality.
IMPRESSION: COPD.  No active disease or change.

## 2018-04-07 LAB — CUP PACEART REMOTE DEVICE CHECK
Battery Impedance: 471 Ohm
Battery Remaining Longevity: 89 mo
Battery Voltage: 2.79 V
Brady Statistic AP VP Percent: 33 %
Brady Statistic AP VS Percent: 2 %
Brady Statistic AS VP Percent: 43 %
Brady Statistic AS VS Percent: 23 %
Date Time Interrogation Session: 20190907172332
Implantable Lead Implant Date: 20131218
Implantable Lead Implant Date: 20131218
Implantable Lead Location: 753859
Implantable Lead Location: 753860
Implantable Lead Model: 5076
Implantable Lead Model: 5076
Implantable Pulse Generator Implant Date: 20131218
Lead Channel Impedance Value: 521 Ohm
Lead Channel Impedance Value: 548 Ohm
Lead Channel Pacing Threshold Amplitude: 0.625 V
Lead Channel Pacing Threshold Amplitude: 0.75 V
Lead Channel Pacing Threshold Pulse Width: 0.4 ms
Lead Channel Pacing Threshold Pulse Width: 0.4 ms
Lead Channel Setting Pacing Amplitude: 2 V
Lead Channel Setting Pacing Amplitude: 2.5 V
Lead Channel Setting Pacing Pulse Width: 0.4 ms
Lead Channel Setting Sensing Sensitivity: 2 mV

## 2018-06-15 ENCOUNTER — Ambulatory Visit (INDEPENDENT_AMBULATORY_CARE_PROVIDER_SITE_OTHER): Payer: Medicare Other

## 2018-06-15 DIAGNOSIS — I442 Atrioventricular block, complete: Secondary | ICD-10-CM | POA: Diagnosis not present

## 2018-06-16 NOTE — Progress Notes (Signed)
Remote pacemaker transmission.   

## 2018-07-29 LAB — CUP PACEART REMOTE DEVICE CHECK
Battery Impedance: 495 Ohm
Battery Remaining Longevity: 84 mo
Battery Voltage: 2.78 V
Brady Statistic AP VP Percent: 57 %
Brady Statistic AP VS Percent: 2 %
Brady Statistic AS VP Percent: 27 %
Brady Statistic AS VS Percent: 14 %
Date Time Interrogation Session: 20191209181256
Implantable Lead Implant Date: 20131218
Implantable Lead Implant Date: 20131218
Implantable Lead Location: 753859
Implantable Lead Location: 753860
Implantable Lead Model: 5076
Implantable Lead Model: 5076
Implantable Pulse Generator Implant Date: 20131218
Lead Channel Impedance Value: 493 Ohm
Lead Channel Impedance Value: 524 Ohm
Lead Channel Pacing Threshold Amplitude: 0.75 V
Lead Channel Pacing Threshold Amplitude: 0.75 V
Lead Channel Pacing Threshold Pulse Width: 0.4 ms
Lead Channel Pacing Threshold Pulse Width: 0.4 ms
Lead Channel Setting Pacing Amplitude: 2 V
Lead Channel Setting Pacing Amplitude: 2.5 V
Lead Channel Setting Pacing Pulse Width: 0.4 ms
Lead Channel Setting Sensing Sensitivity: 2 mV

## 2018-09-14 ENCOUNTER — Ambulatory Visit (INDEPENDENT_AMBULATORY_CARE_PROVIDER_SITE_OTHER): Payer: Medicare Other | Admitting: *Deleted

## 2018-09-14 DIAGNOSIS — I442 Atrioventricular block, complete: Secondary | ICD-10-CM | POA: Diagnosis not present

## 2018-09-14 DIAGNOSIS — I4729 Other ventricular tachycardia: Secondary | ICD-10-CM

## 2018-09-14 DIAGNOSIS — I472 Ventricular tachycardia: Secondary | ICD-10-CM

## 2018-09-15 LAB — CUP PACEART REMOTE DEVICE CHECK
Battery Impedance: 520 Ohm
Battery Remaining Longevity: 82 mo
Battery Voltage: 2.77 V
Brady Statistic AP VP Percent: 59 %
Brady Statistic AP VS Percent: 2 %
Brady Statistic AS VP Percent: 17 %
Brady Statistic AS VS Percent: 22 %
Date Time Interrogation Session: 20200309173557
Implantable Lead Implant Date: 20131218
Implantable Lead Implant Date: 20131218
Implantable Lead Location: 753859
Implantable Lead Location: 753860
Implantable Lead Model: 5076
Implantable Lead Model: 5076
Implantable Pulse Generator Implant Date: 20131218
Lead Channel Impedance Value: 486 Ohm
Lead Channel Impedance Value: 487 Ohm
Lead Channel Pacing Threshold Amplitude: 0.75 V
Lead Channel Pacing Threshold Amplitude: 0.75 V
Lead Channel Pacing Threshold Pulse Width: 0.4 ms
Lead Channel Pacing Threshold Pulse Width: 0.4 ms
Lead Channel Setting Pacing Amplitude: 2 V
Lead Channel Setting Pacing Amplitude: 2.5 V
Lead Channel Setting Pacing Pulse Width: 0.4 ms
Lead Channel Setting Sensing Sensitivity: 2 mV

## 2018-09-22 NOTE — Progress Notes (Signed)
Remote pacemaker transmission.   

## 2018-10-23 ENCOUNTER — Other Ambulatory Visit: Payer: Self-pay | Admitting: Internal Medicine

## 2018-10-23 DIAGNOSIS — Z1231 Encounter for screening mammogram for malignant neoplasm of breast: Secondary | ICD-10-CM

## 2018-11-05 ENCOUNTER — Other Ambulatory Visit: Payer: Self-pay

## 2018-11-05 ENCOUNTER — Other Ambulatory Visit: Payer: Self-pay | Admitting: Podiatry

## 2018-11-05 ENCOUNTER — Ambulatory Visit (INDEPENDENT_AMBULATORY_CARE_PROVIDER_SITE_OTHER): Payer: Medicare Other | Admitting: Podiatry

## 2018-11-05 ENCOUNTER — Ambulatory Visit (INDEPENDENT_AMBULATORY_CARE_PROVIDER_SITE_OTHER): Payer: Medicare Other

## 2018-11-05 ENCOUNTER — Encounter: Payer: Self-pay | Admitting: Podiatry

## 2018-11-05 VITALS — BP 115/61 | HR 60 | Temp 96.6°F | Resp 16

## 2018-11-05 DIAGNOSIS — M79672 Pain in left foot: Secondary | ICD-10-CM

## 2018-11-05 DIAGNOSIS — B351 Tinea unguium: Secondary | ICD-10-CM

## 2018-11-05 DIAGNOSIS — M79675 Pain in left toe(s): Secondary | ICD-10-CM

## 2018-11-05 DIAGNOSIS — M779 Enthesopathy, unspecified: Secondary | ICD-10-CM | POA: Diagnosis not present

## 2018-11-05 DIAGNOSIS — M79671 Pain in right foot: Secondary | ICD-10-CM | POA: Diagnosis not present

## 2018-11-05 DIAGNOSIS — M79674 Pain in right toe(s): Secondary | ICD-10-CM

## 2018-11-05 NOTE — Patient Instructions (Signed)
Diabetes Mellitus and Foot Care  Foot care is an important part of your health, especially when you have diabetes. Diabetes may cause you to have problems because of poor blood flow (circulation) to your feet and legs, which can cause your skin to:   Become thinner and drier.   Break more easily.   Heal more slowly.   Peel and crack.  You may also have nerve damage (neuropathy) in your legs and feet, causing decreased feeling in them. This means that you may not notice minor injuries to your feet that could lead to more serious problems. Noticing and addressing any potential problems early is the best way to prevent future foot problems.  How to care for your feet  Foot hygiene   Wash your feet daily with warm water and mild soap. Do not use hot water. Then, pat your feet and the areas between your toes until they are completely dry. Do not soak your feet as this can dry your skin.   Trim your toenails straight across. Do not dig under them or around the cuticle. File the edges of your nails with an emery board or nail file.   Apply a moisturizing lotion or petroleum jelly to the skin on your feet and to dry, brittle toenails. Use lotion that does not contain alcohol and is unscented. Do not apply lotion between your toes.  Shoes and socks   Wear clean socks or stockings every day. Make sure they are not too tight. Do not wear knee-high stockings since they may decrease blood flow to your legs.   Wear shoes that fit properly and have enough cushioning. Always look in your shoes before you put them on to be sure there are no objects inside.   To break in new shoes, wear them for just a few hours a day. This prevents injuries on your feet.  Wounds, scrapes, corns, and calluses   Check your feet daily for blisters, cuts, bruises, sores, and redness. If you cannot see the bottom of your feet, use a mirror or ask someone for help.   Do not cut corns or calluses or try to remove them with medicine.   If you  find a minor scrape, cut, or break in the skin on your feet, keep it and the skin around it clean and dry. You may clean these areas with mild soap and water. Do not clean the area with peroxide, alcohol, or iodine.   If you have a wound, scrape, corn, or callus on your foot, look at it several times a day to make sure it is healing and not infected. Check for:  ? Redness, swelling, or pain.  ? Fluid or blood.  ? Warmth.  ? Pus or a bad smell.  General instructions   Do not cross your legs. This may decrease blood flow to your feet.   Do not use heating pads or hot water bottles on your feet. They may burn your skin. If you have lost feeling in your feet or legs, you may not know this is happening until it is too late.   Protect your feet from hot and cold by wearing shoes, such as at the beach or on hot pavement.   Schedule a complete foot exam at least once a year (annually) or more often if you have foot problems. If you have foot problems, report any cuts, sores, or bruises to your health care provider immediately.  Contact a health care provider if:     You have a medical condition that increases your risk of infection and you have any cuts, sores, or bruises on your feet.   You have an injury that is not healing.   You have redness on your legs or feet.   You feel burning or tingling in your legs or feet.   You have pain or cramps in your legs and feet.   Your legs or feet are numb.   Your feet always feel cold.   You have pain around a toenail.  Get help right away if:   You have a wound, scrape, corn, or callus on your foot and:  ? You have pain, swelling, or redness that gets worse.  ? You have fluid or blood coming from the wound, scrape, corn, or callus.  ? Your wound, scrape, corn, or callus feels warm to the touch.  ? You have pus or a bad smell coming from the wound, scrape, corn, or callus.  ? You have a fever.  ? You have a red line going up your leg.  Summary   Check your feet every day  for cuts, sores, red spots, swelling, and blisters.   Moisturize feet and legs daily.   Wear shoes that fit properly and have enough cushioning.   If you have foot problems, report any cuts, sores, or bruises to your health care provider immediately.   Schedule a complete foot exam at least once a year (annually) or more often if you have foot problems.  This information is not intended to replace advice given to you by your health care provider. Make sure you discuss any questions you have with your health care provider.  Document Released: 06/21/2000 Document Revised: 08/06/2017 Document Reviewed: 07/26/2016  Elsevier Interactive Patient Education  2019 [employer]

## 2018-11-05 NOTE — Progress Notes (Signed)
   Subjective:    Patient ID: Erika Leach, female    DOB: [birth year], 69 y.o.   MRN: [id number]  HPI    Review of Systems  All other systems reviewed and are negative.      Objective:   Physical Exam        Assessment & Plan:

## 2018-11-05 NOTE — Progress Notes (Signed)
Subjective:   Patient ID: Erika Leach, female   DOB: 68 y.o.   MRN: [id number]   HPI Patient presents stating that her nails are very bothersome and also she just gets pain in her feet that occurs occasionally and she wanted to get it checked.  States it is sore when palpated and is gradually becoming more of an issue for her.  Patient does not smoke and has sugar under reasonable control and likes to be active if possible   Review of Systems  All other systems reviewed and are negative.       Objective:  Physical Exam Vitals signs and nursing note reviewed.  Constitutional:      Appearance: She is well-developed.  Pulmonary:     Effort: Pulmonary effort is normal.  Musculoskeletal: Normal range of motion.  Skin:    General: Skin is warm.  Neurological:     Mental Status: She is alert.     Vascular status mildly diminished but intact bilateral with neurological noted to be mildly diminished both sharp dull vibratory.  Patient does have thick yellow nailbeds 1-5 both feet that are painful and she cannot cut and is also noted to have mild discomfort in the feet but not 1-6 during which area of pain     Assessment:  Long-term diabetic with mycotic elongated nails who does not appear to have other pathology     Plan:  H&P diabetic education and information given to patient.  Today debrided nailbeds 1-5 both feet with no iatrogenic bleeding and advised on routine nail care and explained shoe gear modifications and she will be seen back for regular trimming techniques signed visit

## 2018-12-14 ENCOUNTER — Ambulatory Visit (INDEPENDENT_AMBULATORY_CARE_PROVIDER_SITE_OTHER): Payer: Medicare Other | Admitting: *Deleted

## 2018-12-14 DIAGNOSIS — I442 Atrioventricular block, complete: Secondary | ICD-10-CM | POA: Diagnosis not present

## 2018-12-14 DIAGNOSIS — I472 Ventricular tachycardia: Secondary | ICD-10-CM

## 2018-12-14 DIAGNOSIS — I4729 Other ventricular tachycardia: Secondary | ICD-10-CM

## 2018-12-15 LAB — CUP PACEART REMOTE DEVICE CHECK
Battery Impedance: 569 Ohm
Battery Remaining Longevity: 80 mo
Battery Voltage: 2.78 V
Brady Statistic AP VP Percent: 60 %
Brady Statistic AP VS Percent: 3 %
Brady Statistic AS VP Percent: 13 %
Brady Statistic AS VS Percent: 24 %
Date Time Interrogation Session: 20200608155418
Implantable Lead Implant Date: 20131218
Implantable Lead Implant Date: 20131218
Implantable Lead Location: 753859
Implantable Lead Location: 753860
Implantable Lead Model: 5076
Implantable Lead Model: 5076
Implantable Pulse Generator Implant Date: 20131218
Lead Channel Impedance Value: 507 Ohm
Lead Channel Impedance Value: 557 Ohm
Lead Channel Pacing Threshold Amplitude: 0.625 V
Lead Channel Pacing Threshold Amplitude: 0.625 V
Lead Channel Pacing Threshold Pulse Width: 0.4 ms
Lead Channel Pacing Threshold Pulse Width: 0.4 ms
Lead Channel Setting Pacing Amplitude: 2 V
Lead Channel Setting Pacing Amplitude: 2.5 V
Lead Channel Setting Pacing Pulse Width: 0.4 ms
Lead Channel Setting Sensing Sensitivity: 2 mV

## 2018-12-22 ENCOUNTER — Other Ambulatory Visit: Payer: Self-pay

## 2018-12-22 ENCOUNTER — Ambulatory Visit
Admission: RE | Admit: 2018-12-22 | Discharge: 2018-12-22 | Disposition: A | Discharge status: Home / Self Care | Payer: Medicare Other | Source: Ambulatory Visit | Attending: Internal Medicine | Admitting: Internal Medicine

## 2018-12-22 DIAGNOSIS — Z1231 Encounter for screening mammogram for malignant neoplasm of breast: Secondary | ICD-10-CM

## 2018-12-22 NOTE — Progress Notes (Signed)
Remote pacemaker transmission.   

## 2019-02-03 ENCOUNTER — Ambulatory Visit: Payer: Medicare Other | Admitting: Podiatry

## 2019-03-01 ENCOUNTER — Encounter: Payer: Medicare Other | Admitting: Physician Assistant

## 2019-03-16 ENCOUNTER — Ambulatory Visit (INDEPENDENT_AMBULATORY_CARE_PROVIDER_SITE_OTHER): Payer: Medicare Other | Admitting: *Deleted

## 2019-03-16 DIAGNOSIS — I442 Atrioventricular block, complete: Secondary | ICD-10-CM

## 2019-03-16 LAB — CUP PACEART REMOTE DEVICE CHECK
Battery Impedance: 620 Ohm
Battery Remaining Longevity: 76 mo
Battery Voltage: 2.78 V
Brady Statistic AP VP Percent: 66 %
Brady Statistic AP VS Percent: 2 %
Brady Statistic AS VP Percent: 11 %
Brady Statistic AS VS Percent: 21 %
Date Time Interrogation Session: 20200908162015
Implantable Lead Implant Date: 20131218
Implantable Lead Implant Date: 20131218
Implantable Lead Location: 753859
Implantable Lead Location: 753860
Implantable Lead Model: 5076
Implantable Lead Model: 5076
Implantable Pulse Generator Implant Date: 20131218
Lead Channel Impedance Value: 514 Ohm
Lead Channel Impedance Value: 532 Ohm
Lead Channel Pacing Threshold Amplitude: 0.625 V
Lead Channel Pacing Threshold Amplitude: 0.75 V
Lead Channel Pacing Threshold Pulse Width: 0.4 ms
Lead Channel Pacing Threshold Pulse Width: 0.4 ms
Lead Channel Setting Pacing Amplitude: 2 V
Lead Channel Setting Pacing Amplitude: 2.5 V
Lead Channel Setting Pacing Pulse Width: 0.4 ms
Lead Channel Setting Sensing Sensitivity: 2 mV

## 2019-03-20 ENCOUNTER — Encounter (HOSPITAL_COMMUNITY): Payer: Self-pay | Admitting: Emergency Medicine

## 2019-03-20 ENCOUNTER — Ambulatory Visit (INDEPENDENT_AMBULATORY_CARE_PROVIDER_SITE_OTHER)
Admission: EM | Admit: 2019-03-20 | Discharge: 2019-03-20 | Disposition: A | Discharge status: Home / Self Care | Payer: Medicare Other | Source: Home / Self Care | Attending: Emergency Medicine | Admitting: Emergency Medicine

## 2019-03-20 ENCOUNTER — Inpatient Hospital Stay (HOSPITAL_COMMUNITY)
Admission: EM | Admit: 2019-03-20 | Discharge: 2019-03-24 | DRG: 189 | Disposition: A | Discharge status: Home / Self Care | Payer: Medicare Other | Attending: Internal Medicine | Admitting: Internal Medicine

## 2019-03-20 ENCOUNTER — Other Ambulatory Visit: Payer: Self-pay

## 2019-03-20 ENCOUNTER — Emergency Department (HOSPITAL_COMMUNITY): Payer: Medicare Other

## 2019-03-20 DIAGNOSIS — Z79899 Other long term (current) drug therapy: Secondary | ICD-10-CM

## 2019-03-20 DIAGNOSIS — J440 Chronic obstructive pulmonary disease with acute lower respiratory infection: Secondary | ICD-10-CM | POA: Diagnosis present

## 2019-03-20 DIAGNOSIS — R0902 Hypoxemia: Secondary | ICD-10-CM

## 2019-03-20 DIAGNOSIS — J9621 Acute and chronic respiratory failure with hypoxia: Principal | ICD-10-CM | POA: Diagnosis present

## 2019-03-20 DIAGNOSIS — J441 Chronic obstructive pulmonary disease with (acute) exacerbation: Secondary | ICD-10-CM | POA: Diagnosis not present

## 2019-03-20 DIAGNOSIS — J9600 Acute respiratory failure, unspecified whether with hypoxia or hypercapnia: Secondary | ICD-10-CM | POA: Diagnosis present

## 2019-03-20 DIAGNOSIS — Z79891 Long term (current) use of opiate analgesic: Secondary | ICD-10-CM

## 2019-03-20 DIAGNOSIS — Z7951 Long term (current) use of inhaled steroids: Secondary | ICD-10-CM

## 2019-03-20 DIAGNOSIS — I10 Essential (primary) hypertension: Secondary | ICD-10-CM | POA: Diagnosis present

## 2019-03-20 DIAGNOSIS — E1165 Type 2 diabetes mellitus with hyperglycemia: Secondary | ICD-10-CM | POA: Diagnosis present

## 2019-03-20 DIAGNOSIS — Z803 Family history of malignant neoplasm of breast: Secondary | ICD-10-CM

## 2019-03-20 DIAGNOSIS — G4733 Obstructive sleep apnea (adult) (pediatric): Secondary | ICD-10-CM | POA: Diagnosis present

## 2019-03-20 DIAGNOSIS — I442 Atrioventricular block, complete: Secondary | ICD-10-CM | POA: Diagnosis present

## 2019-03-20 DIAGNOSIS — I5032 Chronic diastolic (congestive) heart failure: Secondary | ICD-10-CM | POA: Diagnosis present

## 2019-03-20 DIAGNOSIS — Z825 Family history of asthma and other chronic lower respiratory diseases: Secondary | ICD-10-CM

## 2019-03-20 DIAGNOSIS — R0602 Shortness of breath: Secondary | ICD-10-CM

## 2019-03-20 DIAGNOSIS — Z6832 Body mass index (BMI) 32.0-32.9, adult: Secondary | ICD-10-CM

## 2019-03-20 DIAGNOSIS — E785 Hyperlipidemia, unspecified: Secondary | ICD-10-CM | POA: Diagnosis present

## 2019-03-20 DIAGNOSIS — I272 Pulmonary hypertension, unspecified: Secondary | ICD-10-CM | POA: Diagnosis present

## 2019-03-20 DIAGNOSIS — Z950 Presence of cardiac pacemaker: Secondary | ICD-10-CM | POA: Diagnosis present

## 2019-03-20 DIAGNOSIS — T380X5A Adverse effect of glucocorticoids and synthetic analogues, initial encounter: Secondary | ICD-10-CM | POA: Diagnosis present

## 2019-03-20 DIAGNOSIS — Z8249 Family history of ischemic heart disease and other diseases of the circulatory system: Secondary | ICD-10-CM

## 2019-03-20 DIAGNOSIS — Z7984 Long term (current) use of oral hypoglycemic drugs: Secondary | ICD-10-CM

## 2019-03-20 DIAGNOSIS — Z8041 Family history of malignant neoplasm of ovary: Secondary | ICD-10-CM

## 2019-03-20 DIAGNOSIS — J209 Acute bronchitis, unspecified: Secondary | ICD-10-CM | POA: Diagnosis present

## 2019-03-20 DIAGNOSIS — I110 Hypertensive heart disease with heart failure: Secondary | ICD-10-CM | POA: Diagnosis present

## 2019-03-20 DIAGNOSIS — Z20828 Contact with and (suspected) exposure to other viral communicable diseases: Secondary | ICD-10-CM | POA: Diagnosis present

## 2019-03-20 DIAGNOSIS — Z87891 Personal history of nicotine dependence: Secondary | ICD-10-CM

## 2019-03-20 LAB — CBC WITH DIFFERENTIAL/PLATELET
Abs Immature Granulocytes: 0.01 10*3/uL (ref 0.00–0.07)
Basophils Absolute: 0.1 10*3/uL (ref 0.0–0.1)
Basophils Relative: 1 %
Eosinophils Absolute: 1.5 10*3/uL — ABNORMAL HIGH (ref 0.0–0.5)
Eosinophils Relative: 16 %
HCT: 46.1 % — ABNORMAL HIGH (ref 36.0–46.0)
Hemoglobin: 14.8 g/dL (ref 12.0–15.0)
Immature Granulocytes: 0 %
Lymphocytes Relative: 37 %
Lymphs Abs: 3.4 10*3/uL (ref 0.7–4.0)
MCH: 27.9 pg (ref 26.0–34.0)
MCHC: 32.1 g/dL (ref 30.0–36.0)
MCV: 87 fL (ref 80.0–100.0)
Monocytes Absolute: 0.8 10*3/uL (ref 0.1–1.0)
Monocytes Relative: 8 %
Neutro Abs: 3.6 10*3/uL (ref 1.7–7.7)
Neutrophils Relative %: 38 %
Platelets: 265 10*3/uL (ref 150–400)
RBC: 5.3 MIL/uL — ABNORMAL HIGH (ref 3.87–5.11)
RDW: 13.6 % (ref 11.5–15.5)
WBC: 9.3 10*3/uL (ref 4.0–10.5)
nRBC: 0 % (ref 0.0–0.2)

## 2019-03-20 LAB — TROPONIN I (HIGH SENSITIVITY)
Troponin I (High Sensitivity): 11 ng/L (ref <18)
Troponin I (High Sensitivity): 10 ng/L (ref <18)

## 2019-03-20 LAB — COMPREHENSIVE METABOLIC PANEL
ALT: 19 U/L (ref 0–44)
AST: 20 U/L (ref 15–41)
Albumin: 4.9 g/dL (ref 3.5–5.0)
Alkaline Phosphatase: 81 U/L (ref 38–126)
Anion gap: 11 (ref 5–15)
BUN: 15 mg/dL (ref 8–23)
CO2: 30 mmol/L (ref 22–32)
Calcium: 10.4 mg/dL — ABNORMAL HIGH (ref 8.9–10.3)
Chloride: 101 mmol/L (ref 98–111)
Creatinine, Ser: 0.85 mg/dL (ref 0.44–1.00)
GFR calc Af Amer: >60 mL/min (ref >60)
GFR calc non Af Amer: >60 mL/min (ref >60)
Glucose, Bld: 96 mg/dL (ref 70–99)
Potassium: 3.8 mmol/L (ref 3.5–5.1)
Sodium: 142 mmol/L (ref 135–145)
Total Bilirubin: 0.6 mg/dL (ref 0.3–1.2)
Total Protein: 8.2 g/dL — ABNORMAL HIGH (ref 6.5–8.1)

## 2019-03-20 LAB — D-DIMER, QUANTITATIVE: D-Dimer, Quant: 0.49 ug/mL-FEU (ref 0.00–0.50)

## 2019-03-20 LAB — BRAIN NATRIURETIC PEPTIDE: B Natriuretic Peptide: 73.1 pg/mL (ref 0.0–100.0)

## 2019-03-20 LAB — GLUCOSE, CAPILLARY: Glucose-Capillary: 257 mg/dL — ABNORMAL HIGH (ref 70–99)

## 2019-03-20 LAB — SARS CORONAVIRUS 2 BY RT PCR (HOSPITAL ORDER, PERFORMED IN ~~LOC~~ HOSPITAL LAB): SARS Coronavirus 2: NEGATIVE

## 2019-03-20 MED ORDER — AMLODIPINE BESYLATE 10 MG PO TABS
10.0000 mg | ORAL_TABLET | Freq: Every day | ORAL | Status: DC
  Administered 2019-03-21 – 2019-03-24 (×4): 10 mg via ORAL
  Filled 2019-03-20 (×4): qty 1

## 2019-03-20 MED ORDER — ATENOLOL 50 MG PO TABS
50.0000 mg | ORAL_TABLET | Freq: Two times a day (BID) | ORAL | Status: DC
  Administered 2019-03-21 – 2019-03-24 (×8): 50 mg via ORAL
  Filled 2019-03-20 (×8): qty 2

## 2019-03-20 MED ORDER — GABAPENTIN 300 MG PO CAPS
300.0000 mg | ORAL_CAPSULE | Freq: Three times a day (TID) | ORAL | Status: DC
  Administered 2019-03-21 – 2019-03-24 (×11): 300 mg via ORAL
  Filled 2019-03-20 (×11): qty 1

## 2019-03-20 MED ORDER — ALBUTEROL (5 MG/ML) CONTINUOUS INHALATION SOLN
10.0000 mg/h | INHALATION_SOLUTION | Freq: Once | RESPIRATORY_TRACT | Status: AC
  Administered 2019-03-20: 10 mg/h via RESPIRATORY_TRACT
  Filled 2019-03-20: qty 20

## 2019-03-20 MED ORDER — ALBUTEROL SULFATE (2.5 MG/3ML) 0.083% IN NEBU
2.5000 mg | INHALATION_SOLUTION | Freq: Four times a day (QID) | RESPIRATORY_TRACT | Status: DC | PRN
  Administered 2019-03-21: 2.5 mg via RESPIRATORY_TRACT
  Filled 2019-03-20: qty 3

## 2019-03-20 MED ORDER — MOMETASONE FURO-FORMOTEROL FUM 200-5 MCG/ACT IN AERO
2.0000 | INHALATION_SPRAY | Freq: Two times a day (BID) | RESPIRATORY_TRACT | Status: DC
  Administered 2019-03-21 – 2019-03-24 (×7): 2 via RESPIRATORY_TRACT
  Filled 2019-03-20: qty 8.8

## 2019-03-20 MED ORDER — PRAVASTATIN SODIUM 10 MG PO TABS
20.0000 mg | ORAL_TABLET | Freq: Every day | ORAL | Status: DC
  Administered 2019-03-21 – 2019-03-23 (×3): 20 mg via ORAL
  Filled 2019-03-20 (×3): qty 2

## 2019-03-20 MED ORDER — ALBUTEROL SULFATE HFA 108 (90 BASE) MCG/ACT IN AERS
8.0000 | INHALATION_SPRAY | Freq: Once | RESPIRATORY_TRACT | Status: AC
  Administered 2019-03-20: 8 via RESPIRATORY_TRACT
  Filled 2019-03-20: qty 6.7

## 2019-03-20 MED ORDER — INSULIN ASPART 100 UNIT/ML ~~LOC~~ SOLN
0.0000 [IU] | Freq: Every day | SUBCUTANEOUS | Status: DC
  Administered 2019-03-21: 3 [IU] via SUBCUTANEOUS
  Administered 2019-03-21 – 2019-03-22 (×2): 2 [IU] via SUBCUTANEOUS
  Administered 2019-03-23: 4 [IU] via SUBCUTANEOUS

## 2019-03-20 MED ORDER — PREDNISONE 20 MG PO TABS: 40.0000 mg | ORAL_TABLET | Freq: Every day | ORAL | Status: DC

## 2019-03-20 MED ORDER — TRAMADOL HCL 50 MG PO TABS
50.0000 mg | ORAL_TABLET | Freq: Every day | ORAL | Status: DC | PRN
  Administered 2019-03-21 – 2019-03-23 (×2): 50 mg via ORAL
  Filled 2019-03-20 (×2): qty 1

## 2019-03-20 MED ORDER — AEROCHAMBER PLUS FLO-VU MEDIUM MISC
1.0000 | Freq: Once | Status: AC
  Administered 2019-03-20: 1
  Filled 2019-03-20: qty 1

## 2019-03-20 MED ORDER — CLONIDINE HCL 0.1 MG PO TABS
0.1000 mg | ORAL_TABLET | Freq: Two times a day (BID) | ORAL | Status: DC
  Administered 2019-03-21 – 2019-03-24 (×8): 0.1 mg via ORAL
  Filled 2019-03-20 (×8): qty 1

## 2019-03-20 MED ORDER — INSULIN ASPART 100 UNIT/ML ~~LOC~~ SOLN
0.0000 [IU] | Freq: Three times a day (TID) | SUBCUTANEOUS | Status: DC
  Administered 2019-03-21: 7 [IU] via SUBCUTANEOUS
  Administered 2019-03-21: 11 [IU] via SUBCUTANEOUS
  Administered 2019-03-21: 7 [IU] via SUBCUTANEOUS
  Administered 2019-03-22: 11 [IU] via SUBCUTANEOUS
  Administered 2019-03-22: 7 [IU] via SUBCUTANEOUS
  Administered 2019-03-22 – 2019-03-23 (×2): 11 [IU] via SUBCUTANEOUS
  Administered 2019-03-23: 15 [IU] via SUBCUTANEOUS
  Administered 2019-03-23: 7 [IU] via SUBCUTANEOUS
  Administered 2019-03-24: 4 [IU] via SUBCUTANEOUS
  Administered 2019-03-24: 7 [IU] via SUBCUTANEOUS

## 2019-03-20 MED ORDER — METHYLPREDNISOLONE SODIUM SUCC 125 MG IJ SOLR
80.0000 mg | Freq: Four times a day (QID) | INTRAMUSCULAR | Status: AC
  Administered 2019-03-21 (×4): 80 mg via INTRAVENOUS
  Filled 2019-03-20 (×4): qty 2

## 2019-03-20 MED ORDER — ALBUTEROL SULFATE HFA 108 (90 BASE) MCG/ACT IN AERS: 2.0000 | INHALATION_SPRAY | Freq: Four times a day (QID) | RESPIRATORY_TRACT | Status: DC | PRN

## 2019-03-20 MED ORDER — SODIUM CHLORIDE 0.9 % IV SOLN
1.0000 g | INTRAVENOUS | Status: DC
  Administered 2019-03-21 – 2019-03-23 (×4): 1 g via INTRAVENOUS
  Filled 2019-03-20: qty 10
  Filled 2019-03-20 (×2): qty 1
  Filled 2019-03-20: qty 10
  Filled 2019-03-20: qty 1

## 2019-03-20 MED ORDER — IPRATROPIUM BROMIDE 0.02 % IN SOLN
0.5000 mg | Freq: Once | RESPIRATORY_TRACT | Status: AC
  Administered 2019-03-20: 20:00:00 0.5 mg via RESPIRATORY_TRACT
  Filled 2019-03-20: qty 2.5

## 2019-03-20 MED ORDER — SODIUM CHLORIDE 0.9 % IV SOLN
INTRAVENOUS | Status: DC
  Administered 2019-03-21: 02:00:00 via INTRAVENOUS

## 2019-03-20 MED ORDER — MAGNESIUM SULFATE 2 GM/50ML IV SOLN
2.0000 g | Freq: Once | INTRAVENOUS | Status: AC
  Administered 2019-03-20: 20:00:00 2 g via INTRAVENOUS
  Filled 2019-03-20: qty 50

## 2019-03-20 MED ORDER — METHYLPREDNISOLONE SODIUM SUCC 125 MG IJ SOLR
125.0000 mg | Freq: Once | INTRAMUSCULAR | Status: AC
  Administered 2019-03-20: 20:00:00 125 mg via INTRAVENOUS
  Filled 2019-03-20: qty 2

## 2019-03-20 MED ORDER — GLIPIZIDE 5 MG PO TABS
5.0000 mg | ORAL_TABLET | Freq: Every day | ORAL | Status: DC
  Administered 2019-03-21 – 2019-03-24 (×4): 5 mg via ORAL
  Filled 2019-03-20 (×6): qty 1

## 2019-03-20 NOTE — ED Notes (Signed)
Pt was 94% when she stood up in the room, after just a minute or so standing she was 89%.  After one lap around the clinic she was 87%.  Dr. Alphonzo Cruise aware.  Pt is going to wait for her boyfriend to pick her up in the lobby and he is going to take her to the ED.  Pt is in no acute distress, but she is unable to make the walk to the ED.  Her boyfriend will escort her, per her request.

## 2019-03-20 NOTE — H&P (Signed)
History and Physical   Erika Leach [id number] DOB: 07-25-1950 DOA: 03/20/2019  Referring MD/NP/PA: Dr. Tamera Punt  PCP: Antonietta Jewel, MD   Outpatient Specialists: None  Patient coming from: Home  Chief Complaint: Shortness of breath  HPI: Erika Leach is a 68 y.o. female with medical history significant of COPD with asthma, diastolic dysfunction CHF, history of complete heart block, hypertension, status post pacemaker placement who presented to the ER with progressive shortness of breath and wheezing.  Symptoms been going on for 3 days.  Patient has tried her home nebulizer treatments as well as inhalers but no relief.  She denied any chest pain.  No sick contacts.  Symptoms associated with cough but nonproductive.  No fever or chills.  No exposure to COVID-19.  Patient has received treatment in the ER with minimal relief.  She is being admitted for treatment of acute exacerbation of her COPD.  ED Course: Temperature is 98.5 blood pressure 145/73 pulse 66 respiratory of 30 oxygen sat 100% room air.  CBC and chemistry of both within normal d-dimer 0.49.  Troponin initially 11 and then 10.  BNP of 73.  Chest x-ray showed no active finding.  Patient has received IV Solu-Medrol and breathing treatment in the ER and is being admitted to the hospital for failing outpatient treatment.  Review of Systems: As per HPI otherwise 10 point review of systems negative.    Past Medical History:  Diagnosis Date  . Asthma   . Chest pain 2014   Cath 10/14>>normal CA  . Complete heart block ([place]) 06/24/2012  . HTN (hypertension)    poorly controlled  . 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    Past Surgical History:  Procedure Laterality Date  . APPENDECTOMY    . LEFT HEART CATHETERIZATION WITH CORONARY ANGIOGRAM N/A 04/30/2013   Procedure: LEFT HEART CATHETERIZATION WITH CORONARY ANGIOGRAM;  Surgeon: Peter M Martinique, MD;  Location: [facility] CATH LAB;  Service: Cardiovascular;  Laterality: N/A;  . PERMANENT  PACEMAKER INSERTION N/A 06/24/2012   Procedure: PERMANENT PACEMAKER INSERTION;  Surgeon: Deboraha Sprang, MD;  Location: [facility] CATH LAB;  Service: Cardiovascular;  Laterality: N/A;  . TUBAL LIGATION       reports that she quit smoking about 6 years ago. Her smoking use included cigarettes. She has a 40.00 pack-year smoking history. She has never used smokeless tobacco. She reports that she does not drink alcohol or use drugs.  No Known Allergies  Family History  Problem Relation Age of Onset  . Heart attack Mother        Died age 63  . Pneumonia Father   . Breast cancer Sister   . Heart attack Brother        3 MI's - 1st age 64  . Ovarian cancer Sister   . Emphysema Brother        smoked     Prior to Admission medications   Medication Sig Start Date End Date Taking? Authorizing Provider  albuterol (PROAIR HFA) 108 (90 BASE) MCG/ACT inhaler Inhale 2 puffs into the lungs every 6 (six) hours as needed for wheezing or shortness of breath.    Yes [provider]  albuterol (PROVENTIL) (2.5 MG/3ML) 0.083% nebulizer solution Take 2.5 mg by nebulization every 6 (six) hours as needed for wheezing or shortness of breath.  10/06/18  Yes [provider]  amLODipine (NORVASC) 10 MG tablet Take 10 mg by mouth daily. 05/03/17  Yes [provider]  atenolol (TENORMIN) 50 MG tablet Take 50 mg by  mouth 2 (two) times daily. 06/27/17  Yes [provider]  cloNIDine (CATAPRES) 0.1 MG tablet Take 0.1 mg by mouth 2 (two) times daily.   Yes [provider]  gabapentin (NEURONTIN) 300 MG capsule Take 1 capsule by mouth 3 (three) times daily. 05/08/17  Yes [provider]  glipiZIDE (GLUCOTROL) 5 MG tablet Take 5 mg by mouth daily before breakfast.   Yes [provider]  lovastatin (MEVACOR) 20 MG tablet Take 20 mg by mouth at bedtime.   Yes [provider]  metFORMIN (GLUCOPHAGE) 500 MG tablet Take 500 mg by mouth 2 (two) times daily. 10/13/16   Yes [provider]  SYMBICORT 160-4.5 MCG/ACT inhaler Inhale 2 puffs into the lungs 2 (two) times daily.  10/28/18  Yes [provider]  traMADol (ULTRAM) 50 MG tablet Take 50-100 mg by mouth daily as needed for pain.    Yes [provider]  amoxicillin (AMOXIL) 500 MG capsule Before dental procedures 04/19/17   [provider]    Physical Exam: Vitals:   03/20/19 1800 03/20/19 1845 03/20/19 1900 03/20/19 1942  BP: 130/65 (!) 126/52 112/68   Pulse:  (!) 59 61   Resp: (!) 22 (!) 23 (!) 27   Temp:      TempSrc:      SpO2:  98% 97% 99%      Constitutional: NAD, anxious Vitals:   03/20/19 1800 03/20/19 1845 03/20/19 1900 03/20/19 1942  BP: 130/65 (!) 126/52 112/68   Pulse:  (!) 59 61   Resp: (!) 22 (!) 23 (!) 27   Temp:      TempSrc:      SpO2:  98% 97% 99%   Eyes: PERRL, lids and conjunctivae normal ENMT: Mucous membranes are moist. Posterior pharynx clear of any exudate or lesions.Normal dentition.  Neck: normal, supple, no masses, no thyromegaly Respiratory: Decreased air entry bilaterally with marked expiratory wheezing, no crackles. Normal respiratory effort. No accessory muscle use.  Cardiovascular: Regular rate and rhythm, no murmurs / rubs / gallops. No extremity edema. 2+ pedal pulses. No carotid bruits.  Abdomen: no tenderness, no masses palpated. No hepatosplenomegaly. Bowel sounds positive.  Musculoskeletal: no clubbing / cyanosis. No joint deformity upper and lower extremities. Good ROM, no contractures. Normal muscle tone.  Skin: no rashes, lesions, ulcers. No induration Neurologic: CN 2-12 grossly intact. Sensation intact, DTR normal. Strength 5/5 in all 4.  Psychiatric: Normal judgment and insight. Alert and oriented x 3. Normal mood.     Labs on Admission: I have personally reviewed following labs and imaging studies  CBC: Recent Labs  Lab 03/20/19 1609  WBC 9.3  NEUTROABS 3.6  HGB 14.8  HCT 46.1*  MCV 87.0  PLT 99991111    Basic Metabolic Panel: Recent Labs  Lab 03/20/19 1609  NA 142  K 3.8  CL 101  CO2 30  GLUCOSE 96  BUN 15  CREATININE 0.85  CALCIUM 10.4*   GFR: CrCl cannot be calculated (Unknown ideal weight.). Liver Function Tests: Recent Labs  Lab 03/20/19 1609  AST 20  ALT 19  ALKPHOS 81  BILITOT 0.6  PROT 8.2*  ALBUMIN 4.9   No results for input(s): LIPASE, AMYLASE in the last 168 hours. No results for input(s): AMMONIA in the last 168 hours. Coagulation Profile: No results for input(s): INR, PROTIME in the last 168 hours. Cardiac Enzymes: No results for input(s): CKTOTAL, CKMB, CKMBINDEX, TROPONINI in the last 168 hours. BNP (last 3 results) No results for  input(s): PROBNP in the last 8760 hours. HbA1C: No results for input(s): HGBA1C in the last 72 hours. CBG: No results for input(s): GLUCAP in the last 168 hours. Lipid Profile: No results for input(s): CHOL, HDL, LDLCALC, TRIG, CHOLHDL, LDLDIRECT in the last 72 hours. Thyroid Function Tests: No results for input(s): TSH, T4TOTAL, FREET4, T3FREE, THYROIDAB in the last 72 hours. Anemia Panel: No results for input(s): VITAMINB12, FOLATE, FERRITIN, TIBC, IRON, RETICCTPCT in the last 72 hours. Urine analysis: No results found for: COLORURINE, APPEARANCEUR, LABSPEC, PHURINE, GLUCOSEU, HGBUR, BILIRUBINUR, KETONESUR, PROTEINUR, UROBILINOGEN, NITRITE, LEUKOCYTESUR Sepsis Labs: @LABRCNTIP (procalcitonin:4,lacticidven:4) ) Recent Results (from the past 240 hour(s))  SARS Coronavirus 2 [facility] order, Performed in [facility] hospital lab) Nasopharyngeal Nasopharyngeal Swab     Status: None   Collection Time: 03/20/19  3:37 PM   Specimen: Nasopharyngeal Swab  Result Value Ref Range Status   SARS Coronavirus 2 NEGATIVE NEGATIVE Final    Comment: (NOTE) If result is NEGATIVE SARS-CoV-2 target nucleic acids are NOT DETECTED. The SARS-CoV-2 RNA is generally detectable in upper and lower  respiratory specimens during the acute  phase of infection. The lowest  concentration of SARS-CoV-2 viral copies this assay can detect is 250  copies / mL. A negative result does not preclude SARS-CoV-2 infection  and should not be used as the sole basis for treatment or other  patient management decisions.  A negative result may occur with  improper specimen collection / handling, submission of specimen other  than nasopharyngeal swab, presence of viral mutation(s) within the  areas targeted by this assay, and inadequate number of viral copies  (<250 copies / mL). A negative result must be combined with clinical  observations, patient history, and epidemiological information. If result is POSITIVE SARS-CoV-2 target nucleic acids are DETECTED. The SARS-CoV-2 RNA is generally detectable in upper and lower  respiratory specimens dur ing the acute phase of infection.  Positive  results are indicative of active infection with SARS-CoV-2.  Clinical  correlation with patient history and other diagnostic information is  necessary to determine patient infection status.  Positive results do  not rule out bacterial infection or co-infection with other viruses. If result is PRESUMPTIVE POSTIVE SARS-CoV-2 nucleic acids MAY BE PRESENT.   A presumptive positive result was obtained on the submitted specimen  and confirmed on repeat testing.  While 2019 novel coronavirus  (SARS-CoV-2) nucleic acids may be present in the submitted sample  additional confirmatory testing may be necessary for epidemiological  and / or clinical management purposes  to differentiate between  SARS-CoV-2 and other Sarbecovirus currently known to infect humans.  If clinically indicated additional testing with an alternate test  methodology [phone]) is advised. The SARS-CoV-2 RNA is generally  detectable in upper and lower respiratory sp ecimens during the acute  phase of infection. The expected result is Negative. Fact Sheet for Patients:   StrictlyIdeas.no Fact Sheet for Healthcare Providers: BankingDealers.co.za This test is not yet approved or cleared by the [place] FDA and has been authorized for detection and/or diagnosis of SARS-CoV-2 by FDA under an Emergency Use Authorization (EUA).  This EUA will remain in effect (meaning this test can be used) for the duration of the COVID-19 declaration under Section 564(b)(1) of the Act, 21 U.S.C. section 360bbb-3(b)(1), unless the authorization is terminated or revoked sooner. Performed at [facility], [place] [address] [place], [place] [address]      Radiological Exams on Admission: Dg Chest 2 View  Result Date: 03/20/2019 CLINICAL  DATA:  Shortness of breath with chest tightness 2 weeks. EXAM: CHEST - 2 VIEW COMPARISON:  11/01/2015 FINDINGS: Left-sided pacemaker unchanged. Lungs are adequately inflated without airspace consolidation or effusion. Cardiomediastinal silhouette and remainder of the exam is unchanged. IMPRESSION: No active cardiopulmonary disease. Electronically Signed   By: Marin Olp M.D.   On: 03/20/2019 13:27    EKG: Independently reviewed.  It showed paced rhythm with a rate of 60.  No significant ST findings  Assessment/Plan Principal Problem:   COPD exacerbation (HCC) Active Problems:   Essential hypertension   Mild pulmonary hypertension (HCC)   Pacemaker-[employer]   OSA (obstructive sleep apnea)   Morbid obesity (HCC)     #1 acute exacerbation of COPD: Patient will be admitted and will be initiated on the COPD gold 2 pathway.  We will get her on IV steroid nebulizer as well as antibiotics.  Oxygen now is required.  She was previously not on home O2.  Continue until resolution.  #2 essential hypertension: Continue home regimen and monitor.  #3 history of pacemaker placement: No new issues.  #4 obstructive sleep apnea: CPAP at night recommended  #5 morbid obesity: Dietary counseling    DVT prophylaxis: Lovenox Code Status: Full code Family Communication: No family at bedside Disposition Plan: Home Consults called: None Admission status: Observation  Severity of Illness: The appropriate patient status for this patient is OBSERVATION. Observation status is judged to be reasonable and necessary in order to provide the required intensity of service to ensure the patient's safety. The patient's presenting symptoms, physical exam findings, and initial radiographic and laboratory data in the context of their medical condition is felt to place them at decreased risk for further clinical deterioration. Furthermore, it is anticipated that the patient will be medically stable for discharge from the hospital within 2 midnights of admission. The following factors support the patient status of observation.   " The patient's presenting symptoms include shortness of breath. " The physical exam findings include expiratory wheezing. " The initial radiographic and laboratory data are no significant findings on chest x-ray.     Barbette Merino MD [facility] Pager [phone]  If 7PM-7AM, please contact night-coverage [email or web address] Password [facility]  03/20/2019, 8:18 PM

## 2019-03-20 NOTE — ED Notes (Signed)
Daughter at bedside, brought pt Erika Leach.

## 2019-03-20 NOTE — ED Notes (Signed)
Pt given [place] sandwich, cheese, graham crackers & water for dinner.

## 2019-03-20 NOTE — ED Triage Notes (Signed)
Pt reports several months of issues with her asthma.  She states her nebulizer and inhalers only help her for short times.  Pt has seen her PCP about this.  Pt here today for continued issues with  Breathing.  Pt was 87% when she came in the room.  After sitting quietly, I was able to get her to 96%, but as soon as she started talking again she was desating.

## 2019-03-20 NOTE — ED Provider Notes (Signed)
[facility] EMERGENCY DEPARTMENT Provider Note   CSN: [id number] Arrival date & time: 03/20/19  1251     History   Chief Complaint Chief Complaint  Patient presents with  . Shortness of Breath    HPI Erika Leach is a 68 y.o. female with history of obesity, asthma/bronchitis/COPD, complete heart block with pacemaker, congestive heart failure with preserved EF, OSA not on CPAP sent to the ER from urgent care for evaluation of shortness of breath and hypoxemia.  Patient reports long history of exertional shortness of breath that she attributes to her bronchitis.  She used to smoke for 40 years but has stopped several years ago.  States at baseline she will get mildly short of breath with exertion but over the last couple of months this has gradually worsened.  Over the last 2 days she has had significantly worsening exertional shortness of breath associated with chest pressure, wheezing, lightheadedness and feeling like she is going to pass out.  Has had increased cough with brown-tinged sputum.  She went to urgent care for evaluation and was referred to the ER due to her low oxygen levels.  She does not usually use oxygen at home.  She has 2 inhalers and a nebulizing machine at home and she has used these as needed for shortness of breath, these provide very transient relief in her shortness of breath.  States she was on hydrochlorothiazide but her doctor told her she could stop taking this so she stopped this medicine a month ago.  Reports chronic, intermittent nighttime bilateral ankle swelling.  No calf pain.  She purchased a hormone "supplement" online a month ago and has been taking it for her "pH balance".  She does not know the name of this but thinks it is a hormone.  She denies any fevers, congestion, sore throat, vomiting, diarrhea, abdominal pain.  She lives with her boyfriend who is well and has no COVID-19 symptoms.  No recent travel.  No exposure to sick patients or  COVID-19.     HPI  Past Medical History:  Diagnosis Date  . Asthma   . Chest pain 2014   Cath 10/14>>normal CA  . Complete heart block ([place]) 06/24/2012  . HTN (hypertension)    poorly controlled  . 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    Patient Active Problem List   Diagnosis Date Noted  . COPD exacerbation ([place]) 03/20/2019  . Morbid obesity ([place]) 11/29/2015  . COPD GOLD II  11/01/2015  . OSA (obstructive sleep apnea) 12/04/2014  . Dyspnea on exertion 07/09/2013  . 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  . Essential hypertension 06/28/2012  . Mild pulmonary hypertension ([place]) 06/28/2012  . Asthma 06/28/2012  . Complete heart block ([place]) 06/24/2012    Past Surgical History:  Procedure Laterality Date  . APPENDECTOMY    . LEFT HEART CATHETERIZATION WITH CORONARY ANGIOGRAM N/A 04/30/2013   Procedure: LEFT HEART CATHETERIZATION WITH CORONARY ANGIOGRAM;  Surgeon: Peter M Martinique, MD;  Location: [facility] CATH LAB;  Service: Cardiovascular;  Laterality: N/A;  . PERMANENT PACEMAKER INSERTION N/A 06/24/2012   Procedure: PERMANENT PACEMAKER INSERTION;  Surgeon: Deboraha Sprang, MD;  Location: [facility] CATH LAB;  Service: Cardiovascular;  Laterality: N/A;  . TUBAL LIGATION       OB History   No obstetric history on file.      Home Medications    Prior to Admission medications   Medication Sig Start Date End Date Taking? Authorizing Provider  albuterol (PROAIR HFA) 108 (90 BASE) MCG/ACT inhaler  Inhale 2 puffs into the lungs every 6 (six) hours as needed for wheezing or shortness of breath.    Yes [provider]  albuterol (PROVENTIL) (2.5 MG/3ML) 0.083% nebulizer solution Take 2.5 mg by nebulization every 6 (six) hours as needed for wheezing or shortness of breath.  10/06/18  Yes [provider]  amLODipine (NORVASC) 10 MG tablet Take 10 mg by mouth daily. 05/03/17  Yes [provider]  atenolol (TENORMIN) 50 MG tablet Take 50 mg by mouth 2 (two) times daily. 06/27/17  Yes  [provider]  cloNIDine (CATAPRES) 0.1 MG tablet Take 0.1 mg by mouth 2 (two) times daily.   Yes [provider]  gabapentin (NEURONTIN) 300 MG capsule Take 1 capsule by mouth 3 (three) times daily. 05/08/17  Yes [provider]  glipiZIDE (GLUCOTROL) 5 MG tablet Take 5 mg by mouth daily before breakfast.   Yes [provider]  lovastatin (MEVACOR) 20 MG tablet Take 20 mg by mouth at bedtime.   Yes [provider]  metFORMIN (GLUCOPHAGE) 500 MG tablet Take 500 mg by mouth 2 (two) times daily. 10/13/16  Yes [provider]  SYMBICORT 160-4.5 MCG/ACT inhaler Inhale 2 puffs into the lungs 2 (two) times daily.  10/28/18  Yes [provider]  traMADol (ULTRAM) 50 MG tablet Take 50-100 mg by mouth daily as needed for pain.    Yes [provider]  amoxicillin (AMOXIL) 500 MG capsule Before dental procedures 04/19/17   [provider]    Family History Family History  Problem Relation Age of Onset  . Heart attack Mother        Died age 3  . Pneumonia Father   . Breast cancer Sister   . Heart attack Brother        3 MI's - 1st age 51  . Ovarian cancer Sister   . Emphysema Brother        smoked    Social History Social History   Tobacco Use  . Smoking status: Former Smoker    Packs/day: 1.00    Years: 40.00    Pack years: 40.00    Types: Cigarettes    Quit date: 07/08/2012    Years since quitting: 6.7  . Smokeless tobacco: Never Used  Substance Use Topics  . Alcohol use: No    Alcohol/week: 0.0 standard drinks    Comment: rare - special occasions  . Drug use: No     Allergies   Patient has no known allergies.   Review of Systems Review of Systems  Respiratory: Positive for cough, chest tightness and shortness of breath.   Cardiovascular: Positive for chest pain and leg swelling.  Neurological: Positive for light-headedness.  All other systems reviewed and are negative.    Physical Exam  Updated Vital Signs BP 139/72 (BP Location: Left Arm)   Pulse (!) 59   Temp 98.2 F (36.8 C) (Oral)   Resp 20   Wt 88.3 kg   SpO2 96%   BMI 32.39 kg/m   Physical Exam Vitals signs and nursing note reviewed.  Constitutional:      General: She is not in acute distress.    Appearance: She is well-developed.     Comments: NAD.  HENT:     Head: Normocephalic and atraumatic.     Right Ear: External ear normal.     Left Ear: External ear normal.     Nose: Nose normal.  Eyes:     General: No  scleral icterus.    Conjunctiva/sclera: Conjunctivae normal.  Neck:     Musculoskeletal: Normal range of motion and neck supple.  Cardiovascular:     Rate and Rhythm: Normal rate and regular rhythm.     Heart sounds: Normal heart sounds. No murmur.     Comments: 1+ radial and DP pulses bilaterally.  No lower extremity edema.  No calf tenderness. Pulmonary:     Effort: Pulmonary effort is normal.     Breath sounds: Decreased breath sounds and wheezing present.     Comments: Inspiratory/expiratory wheezing in all lung fields.  Slightly diminished lung sounds to the lower lobes, difficult exam due to body habitus.  Prolonged expiration.  Speaking in full sentences.  SPO2 greater than 95% on 2 L [place]. Musculoskeletal: Normal range of motion.        General: No deformity.  Skin:    General: Skin is warm and dry.     Capillary Refill: Capillary refill takes less than 2 seconds.  Neurological:     Mental Status: She is alert and oriented to person, place, and time.  Psychiatric:        Behavior: Behavior normal.        Thought Content: Thought content normal.        Judgment: Judgment normal.      ED Treatments / Results  Labs (all labs ordered are listed, but only abnormal results are displayed) Labs Reviewed  COMPREHENSIVE METABOLIC PANEL - Abnormal; Notable for the following components:      Result Value   Calcium 10.4 (*)    Total Protein 8.2 (*)    All other components within normal  limits  CBC WITH DIFFERENTIAL/PLATELET - Abnormal; Notable for the following components:   RBC 5.30 (*)    HCT 46.1 (*)    Eosinophils Absolute 1.5 (*)    All other components within normal limits  GLUCOSE, CAPILLARY - Abnormal; Notable for the following components:   Glucose-Capillary 257 (*)    All other components within normal limits  SARS CORONAVIRUS 2 (HOSPITAL ORDER, [facility])  D-DIMER, QUANTITATIVE (NOT AT [facility])  BRAIN NATRIURETIC PEPTIDE  HIV ANTIBODY (ROUTINE TESTING W REFLEX)  CBC  BASIC METABOLIC PANEL  HEMOGLOBIN A1C  TROPONIN I (HIGH SENSITIVITY)  TROPONIN I (HIGH SENSITIVITY)    EKG EKG Interpretation  Date/Time:  Saturday March 20 2019 12:57:48 EDT Ventricular Rate:  60 PR Interval:  210 QRS Duration: 120 QT Interval:  416 QTC Calculation: 416 R Axis:   -83 Text Interpretation:  AV dual-paced rhythm with prolonged AV conduction Abnormal ECG Confirmed by Malvin Johns [phone]) on 03/20/2019 3:29:47 PM   Radiology Dg Chest 2 View  Result Date: 03/20/2019 CLINICAL DATA:  Shortness of breath with chest tightness 2 weeks. EXAM: CHEST - 2 VIEW COMPARISON:  11/01/2015 FINDINGS: Left-sided pacemaker unchanged. Lungs are adequately inflated without airspace consolidation or effusion. Cardiomediastinal silhouette and remainder of the exam is unchanged. IMPRESSION: No active cardiopulmonary disease. Electronically Signed   By: Marin Olp M.D.   On: 03/20/2019 13:27    Procedures .Critical Care Performed by: Kinnie Feil, PA-C Authorized by: Kinnie Feil, PA-C   Critical care provider statement:    Critical care time (minutes):  45   Critical care was necessary to treat or prevent imminent or life-threatening deterioration of the following conditions:  Respiratory failure   Critical care was time spent personally by me on the following activities:  Discussions with consultants, evaluation  of patient's response to  treatment, examination of patient, ordering and performing treatments and interventions, ordering and review of laboratory studies, ordering and review of radiographic studies, pulse oximetry, re-evaluation of patient's condition, obtaining history from patient or surrogate, review of old charts and development of treatment plan with patient or surrogate   I assumed direction of critical care for this patient from another provider in my specialty: no     (including critical care time)  Medications Ordered in ED Medications  pravastatin (PRAVACHOL) tablet 20 mg (has no administration in time range)  traMADol (ULTRAM) tablet 50-100 mg (has no administration in time range)  cloNIDine (CATAPRES) tablet 0.1 mg (has no administration in time range)  glipiZIDE (GLUCOTROL) tablet 5 mg (has no administration in time range)  atenolol (TENORMIN) tablet 50 mg (has no administration in time range)  amLODipine (NORVASC) tablet 10 mg (has no administration in time range)  gabapentin (NEURONTIN) capsule 300 mg (has no administration in time range)  mometasone-formoterol (DULERA) 200-5 MCG/ACT inhaler 2 puff (2 puffs Inhalation Not Given 03/20/19 2341)  albuterol (PROVENTIL) (2.5 MG/3ML) 0.083% nebulizer solution 2.5 mg (has no administration in time range)  cefTRIAXone (ROCEPHIN) 1 g in sodium chloride 0.9 % 100 mL IVPB (has no administration in time range)  methylPREDNISolone sodium succinate (SOLU-MEDROL) 125 mg/2 mL injection 80 mg (has no administration in time range)    Followed by  predniSONE (DELTASONE) tablet 40 mg (has no administration in time range)  insulin aspart (novoLOG) injection 0-20 Units (has no administration in time range)  insulin aspart (novoLOG) injection 0-5 Units (has no administration in time range)  0.9 %  sodium chloride infusion (has no administration in time range)  albuterol (VENTOLIN HFA) 108 (90 Base) MCG/ACT inhaler 8 puff (8 puffs Inhalation Given 03/20/19 1534)  AeroChamber  Plus Flo-Vu Medium MISC 1 each (1 each Other Given 03/20/19 1533)  albuterol (PROVENTIL,VENTOLIN) solution continuous neb (10 mg/hr Nebulization Given 03/20/19 1938)  ipratropium (ATROVENT) nebulizer solution 0.5 mg (0.5 mg Nebulization Given 03/20/19 1931)  methylPREDNISolone sodium succinate (SOLU-MEDROL) 125 mg/2 mL injection 125 mg (125 mg Intravenous Given 03/20/19 1930)  magnesium sulfate IVPB 2 g 50 mL (0 g Intravenous Stopped 03/20/19 2052)     Initial Impression / Assessment and Plan / ED Course  I have reviewed the triage vital signs and the nursing notes.  Pertinent labs & imaging results that were available during my care of the patient were reviewed by me and considered in my medical decision making (see chart for details).  Clinical Course as of Mar 19 2349  Sat Mar 20, 2019  1517  IMPRESSION: No active cardiopulmonary disease.    DG Chest 2 View [CG]    Clinical Course User Index [CG] Kinnie Feil, PA-C   Patient is EMR reviewed to obtain pertinent PMH.  She had an echo January 2019 that showed mild left ventricular hypertrophy, grade 1 diastolic dysfunction and EF of 60 to 65%, increased PA pressures.  LHC in 2014 showed normal coronaries.  68 year old here with shortness of breath, chronic but acutely worsening the last 2 days.  Highest on DDX is COPD/bronchitis exacerbation given her cough, increased brown sputum, wheezing on exam.  No fever, COVID-19 exposure.  She has history of heart failure and recently stopped hydrochlorothiazide.  Given her respiratory failure, mild CHF decompensation is also considered but less likely.  She had an LHC in 2014 and had normal coronaries, her chest discomfort is described as a tightness and accompanied  by wheezing, cough so ACS is less likely.  It is unclear what supplement she is taking but thinks it is a hormone.  My considered pulmonary embolism as well.  1555: EKG done at triage reviewed by me and EDP, no signs of ischemia,  right heart strain.  Chest x-ray is without pulmonary edema, pneumothorax, infiltrates, pulmonary edema.  We will give albuterol inhaler, reassess.  Pending lab work.  ER work up reviewed by my most suggestive of COPD exacerbation leading to mild respiratory failure.  She is requiring 2 L [place]. No clinical/respiratory decline while in ER.  Trop 11 > 10. BNP WNL. D-dimer negative. CXR w/o infiltrates, pulmonary edema, PTX. Hgb normal. COVID negative. Symptoms and work up not c/w symptomatic anemia, PNA, COVID, HF decompensation, ACS.   She was given mag, duoneb, methylprednisone here.   Accepted by Dr Jonelle Sidle. Shared with EDP.   Final Clinical Impressions(s) / ED Diagnoses   Final diagnoses:  COPD exacerbation [facility])  Hypoxemia    ED Discharge Orders    None       Arlean Hopping 03/20/19 2350    Malvin Johns, MD 03/21/19 1106

## 2019-03-20 NOTE — Discharge Instructions (Signed)
I am concerned that you are going to need more intensive treatments and work-up than what we can provide here at urgent care to make sure that this is just a COPD exacerbation.  Let them know if your shortness of breath gets worse, if you start to get tired of breathing, or for other concerns.

## 2019-03-20 NOTE — ED Triage Notes (Signed)
Patient came down from [facility] for shortness of breathe for a while. States breathing more difficulty when up and moving.

## 2019-03-20 NOTE — ED Notes (Signed)
Pt's O2 sat was 89% on room air once settled onto the bed. 2L via  started, O2 sat came up to 99%

## 2019-03-20 NOTE — ED Provider Notes (Signed)
HPI  SUBJECTIVE:  Erika Leach is a 68 y.o. female who presents with worsening shortness of breath over the past several weeks.  States that she has had difficulty breathing for "months".  She reports a productive cough but states it has not changed in amount or color.  No fevers.  She reports wheezing, chest pain described as tightness and soreness that has been constant over the past several weeks.  States that she is getting tired of breathing.  She reports dyspnea on exertion to a few steps and now states that she is short of breath at rest.  No unintentional weight gain, lower extremity edema, nocturia, orthopnea.  She reports PND.  No calf pain, swelling, hemoptysis, surgery or trauma in the past 4 weeks, recent prolonged immobilization.  She states that she is compliant with her Symbicort.  She is using her inhaler every 1-2 hours over the past week.  She normally uses it 3-4 times a day.  She has tried the albuterol inhaler and she states it works very temporarily.  Symptoms are worse with walking around.  She states that this feels like when she was found to have complete heart block and was in congestive heart failure at that time.  No recent steroid use.  She has a past medical history of COPD, Gold 2, she was admitted to the hospital as a child, no recent admissions.  No intubations.  She has a history of complete heart block status post pacemaker, hypertension, pulmonary hypertension, CHF.  No history of cancer, DVT, PE.  [id number], Sami, MD     Past Medical History:  Diagnosis Date  . Asthma   . Chest pain 2014   Cath 10/14>>normal CA  . Complete heart block ([place]) 06/24/2012  . HTN (hypertension)    poorly controlled  . 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    Past Surgical History:  Procedure Laterality Date  . APPENDECTOMY    . LEFT HEART CATHETERIZATION WITH CORONARY ANGIOGRAM N/A 04/30/2013   Procedure: LEFT HEART CATHETERIZATION WITH CORONARY ANGIOGRAM;  Surgeon: Peter M Martinique,  MD;  Location: [facility] CATH LAB;  Service: Cardiovascular;  Laterality: N/A;  . PERMANENT PACEMAKER INSERTION N/A 06/24/2012   Procedure: PERMANENT PACEMAKER INSERTION;  Surgeon: Deboraha Sprang, MD;  Location: [facility] CATH LAB;  Service: Cardiovascular;  Laterality: N/A;  . TUBAL LIGATION      Family History  Problem Relation Age of Onset  . Heart attack Mother        Died age 36  . Pneumonia Father   . Breast cancer Sister   . Heart attack Brother        3 MI's - 1st age 62  . Ovarian cancer Sister   . Emphysema Brother        smoked    Social History   Tobacco Use  . Smoking status: Former Smoker    Packs/day: 1.00    Years: 40.00    Pack years: 40.00    Types: Cigarettes    Quit date: 07/08/2012    Years since quitting: 6.7  . Smokeless tobacco: Never Used  Substance Use Topics  . Alcohol use: No    Alcohol/week: 0.0 standard drinks    Comment: rare - special occasions  . Drug use: No    No current facility-administered medications for this encounter.   Current Outpatient Medications:  .  albuterol (PROAIR HFA) 108 (90 BASE) MCG/ACT inhaler, Inhale 2 puffs into the lungs every 6 (six) hours as needed for wheezing or  shortness of breath. , Disp: , Rfl:  .  albuterol (PROVENTIL) (2.5 MG/3ML) 0.083% nebulizer solution, , Disp: , Rfl:  .  amLODipine (NORVASC) 10 MG tablet, Take 10 mg by mouth daily., Disp: , Rfl: 3 .  atenolol (TENORMIN) 50 MG tablet, Take 50 mg by mouth 2 (two) times daily., Disp: , Rfl: 11 .  cloNIDine (CATAPRES) 0.1 MG tablet, Take 0.1 mg by mouth 2 (two) times daily., Disp: , Rfl:  .  gabapentin (NEURONTIN) 300 MG capsule, Take 1 capsule by mouth 3 (three) times daily., Disp: , Rfl: 11 .  glipiZIDE (GLUCOTROL) 5 MG tablet, Take 5 mg by mouth daily before breakfast., Disp: , Rfl:  .  hydrochlorothiazide (MICROZIDE) 12.5 MG capsule, TAKE 1 CAPSULE BY MOUTH EVERY DAY IN THE MORNING, Disp: , Rfl:  .  lovastatin (MEVACOR) 20 MG tablet, Take 20 mg by mouth at  bedtime., Disp: , Rfl:  .  metFORMIN (GLUCOPHAGE) 500 MG tablet, Take 500 mg by mouth 2 (two) times daily., Disp: , Rfl: 11 .  SYMBICORT 160-4.5 MCG/ACT inhaler, , Disp: , Rfl:  .  traMADol (ULTRAM) 50 MG tablet, Take 50-100 mg by mouth daily as needed for pain. , Disp: , Rfl:  .  amoxicillin (AMOXIL) 500 MG capsule, Before dental procedures, Disp: , Rfl: 1  No Known Allergies   ROS  As noted in HPI.   Physical Exam  BP (!) 105/55 (BP Location: Left Arm)   Pulse 60   Temp 98.4 F (36.9 C) (Oral)   Resp (!) 30   SpO2 96% Comment: At rest, 87 w/ exertion  Constitutional: Well developed, well nourished, no acute distress speaking in full sentences, but dyspneic when talking. Eyes:  EOMI, conjunctiva normal bilaterally HENT: Normocephalic, atraumatic,mucus membranes moist Respiratory: Increased respiratory effort.  Poor air movement, diffuse wheezing throughout all lung fields. Cardiovascular: Normal rate regular rhythm no murmurs rubs or gallops GI: nondistended skin: No rash, skin intact Musculoskeletal: Calves symmetric, nontender, no edema. Neurologic: Alert & oriented x 3, no focal neuro deficits Psychiatric: Speech and behavior appropriate   ED Course   Medications - No data to display  No orders of the defined types were placed in this encounter.   No results found for this or any previous visit (from the past 24 hour(s)). No results found.  ED Clinical Impression  1. Shortness of breath   2. COPD exacerbation [facility])      ED Assessment/Plan  Suspect COPD exacerbation, but CHF is in the differential.  Patient desats to 87% when walking, and is tachypneic at rest.  Suspect that she is going to need more than several puffs from an albuterol inhaler.  transferring her down to the ED for a more comprehensive work-up and treatment than what is available here.  Feel the patient is stable to go via private vehicle.  Patient is agreeable to go to the [facility] ED.   No  orders of the defined types were placed in this encounter.   *This clinic note was created using Dragon dictation software. Therefore, there may be occasional mistakes despite careful proofreading.   ?   Melynda Ripple, MD 03/20/19 1238

## 2019-03-20 NOTE — ED Notes (Addendum)
ED TO INPATIENT HANDOFF REPORT  ED Nurse Name and Phone #: Jinny Blossom [phone]  S Name/Age/Gender Erika Leach 68 y.o. female Room/Bed: 045C/045C  Code Status   Code Status: Not on file  Home/SNF/Other Home Patient oriented to: self, place, time and situation Is this baseline? Yes   Triage Complete: Triage complete  Chief Complaint sob  Triage Note Patient came down from UC for shortness of breathe for a while. States breathing more difficulty when up and moving.    Allergies No Known Allergies  Level of Care/Admitting Diagnosis ED Disposition    ED Disposition Condition [facility] Area: [place] [[id number]]  Level of Care: Telemetry Medical [104]  I expect the patient will be discharged within 24 hours: Yes  LOW acuity---Tx typically complete <24 hrs---ACUTE conditions typically can be evaluated <24 hours---LABS likely to return to acceptable levels <24 hours---IS near functional baseline---EXPECTED to return to current living arrangement---NOT newly hypoxic: Meets criteria for 5C-Observation unit  Covid Evaluation: Asymptomatic Screening Protocol (No Symptoms)  Diagnosis: COPD exacerbation ([place]) [id number]  Admitting Physician: Elwyn Reach [[id number]]  Attending Physician: Elwyn Reach [[id number]]  PT Class (Do Not Modify): Observation [104]  PT Acc Code (Do Not Modify): Observation [10022]       B Medical/Surgery History Past Medical History:  Diagnosis Date  . Asthma   . Chest pain 2014   Cath 10/14>>normal CA  . Complete heart block ([place]) 06/24/2012  . HTN (hypertension)    poorly controlled  . 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   Past Surgical History:  Procedure Laterality Date  . APPENDECTOMY    . LEFT HEART CATHETERIZATION WITH CORONARY ANGIOGRAM N/A 04/30/2013   Procedure: LEFT HEART CATHETERIZATION WITH CORONARY ANGIOGRAM;  Surgeon: Peter M Martinique, MD;  Location: [facility] CATH LAB;  Service: Cardiovascular;  Laterality: N/A;  .  PERMANENT PACEMAKER INSERTION N/A 06/24/2012   Procedure: PERMANENT PACEMAKER INSERTION;  Surgeon: Deboraha Sprang, MD;  Location: [facility] CATH LAB;  Service: Cardiovascular;  Laterality: N/A;  . TUBAL LIGATION       A IV Location/Drains/Wounds Patient Lines/Drains/Airways Status   Active Line/Drains/Airways    Name:   Placement date:   Placement time:   Site:   Days:   Peripheral IV 03/20/19 Right Antecubital   03/20/19    1606    Antecubital   less than 1   Incision 06/24/12 Chest Left;Upper   06/24/12    2000     2460          Intake/Output Last 24 hours No intake or output data in the 24 hours ending 03/20/19 2040  Labs/Imaging Results for orders placed or performed during the hospital encounter of 03/20/19 (from the past 48 hour(s))  SARS Coronavirus 2 [facility] order, Performed in [facility] hospital lab) Nasopharyngeal Nasopharyngeal Swab     Status: None   Collection Time: 03/20/19  3:37 PM   Specimen: Nasopharyngeal Swab  Result Value Ref Range   SARS Coronavirus 2 NEGATIVE NEGATIVE    Comment: (NOTE) If result is NEGATIVE SARS-CoV-2 target nucleic acids are NOT DETECTED. The SARS-CoV-2 RNA is generally detectable in upper and lower  respiratory specimens during the acute phase of infection. The lowest  concentration of SARS-CoV-2 viral copies this assay can detect is 250  copies / mL. A negative result does not preclude SARS-CoV-2 infection  and should not be used as the sole basis for treatment or other  patient management decisions.  A negative result may  occur with  improper specimen collection / handling, submission of specimen other  than nasopharyngeal swab, presence of viral mutation(s) within the  areas targeted by this assay, and inadequate number of viral copies  (<250 copies / mL). A negative result must be combined with clinical  observations, patient history, and epidemiological information. If result is POSITIVE SARS-CoV-2 target nucleic acids are  DETECTED. The SARS-CoV-2 RNA is generally detectable in upper and lower  respiratory specimens dur ing the acute phase of infection.  Positive  results are indicative of active infection with SARS-CoV-2.  Clinical  correlation with patient history and other diagnostic information is  necessary to determine patient infection status.  Positive results do  not rule out bacterial infection or co-infection with other viruses. If result is PRESUMPTIVE POSTIVE SARS-CoV-2 nucleic acids MAY BE PRESENT.   A presumptive positive result was obtained on the submitted specimen  and confirmed on repeat testing.  While 2019 novel coronavirus  (SARS-CoV-2) nucleic acids may be present in the submitted sample  additional confirmatory testing may be necessary for epidemiological  and / or clinical management purposes  to differentiate between  SARS-CoV-2 and other Sarbecovirus currently known to infect humans.  If clinically indicated additional testing with an alternate test  methodology [phone]) is advised. The SARS-CoV-2 RNA is generally  detectable in upper and lower respiratory sp ecimens during the acute  phase of infection. The expected result is Negative. Fact Sheet for Patients:  StrictlyIdeas.no Fact Sheet for Healthcare Providers: BankingDealers.co.za This test is not yet approved or cleared by the [place] FDA and has been authorized for detection and/or diagnosis of SARS-CoV-2 by FDA under an Emergency Use Authorization (EUA).  This EUA will remain in effect (meaning this test can be used) for the duration of the COVID-19 declaration under Section 564(b)(1) of the Act, 21 U.S.C. section 360bbb-3(b)(1), unless the authorization is terminated or revoked sooner. Performed at [facility], [place] [address], [place], [place] [address]   Comprehensive metabolic panel     Status: Abnormal   Collection Time: 03/20/19  4:09 PM  Result  Value Ref Range   Sodium 142 135 - 145 mmol/L   Potassium 3.8 3.5 - 5.1 mmol/L   Chloride 101 98 - 111 mmol/L   CO2 30 22 - 32 mmol/L   Glucose, Bld 96 70 - 99 mg/dL   BUN 15 8 - 23 mg/dL   Creatinine, Ser 0.85 0.44 - 1.00 mg/dL   Calcium 10.4 (H) 8.9 - 10.3 mg/dL   Total Protein 8.2 (H) 6.5 - 8.1 g/dL   Albumin 4.9 3.5 - 5.0 g/dL   AST 20 15 - 41 U/L   ALT 19 0 - 44 U/L   Alkaline Phosphatase 81 38 - 126 U/L   Total Bilirubin 0.6 0.3 - 1.2 mg/dL   GFR calc non Af Amer >60 >60 mL/min   GFR calc Af Amer >60 >60 mL/min   Anion gap 11 5 - 15    Comment: Performed at [place] [address] [place], [place] [address]  CBC WITH DIFFERENTIAL     Status: Abnormal   Collection Time: 03/20/19  4:09 PM  Result Value Ref Range   WBC 9.3 4.0 - 10.5 K/uL   RBC 5.30 (H) 3.87 - 5.11 MIL/uL   Hemoglobin 14.8 12.0 - 15.0 g/dL   HCT 46.1 (H) 36.0 - 46.0 %   MCV 87.0 80.0 - 100.0 fL   MCH 27.9 26.0 - 34.0  pg   MCHC 32.1 30.0 - 36.0 g/dL   RDW 13.6 11.5 - 15.5 %   Platelets 265 150 - 400 K/uL   nRBC 0.0 0.0 - 0.2 %   Neutrophils Relative % 38 %   Neutro Abs 3.6 1.7 - 7.7 K/uL   Lymphocytes Relative 37 %   Lymphs Abs 3.4 0.7 - 4.0 K/uL   Monocytes Relative 8 %   Monocytes Absolute 0.8 0.1 - 1.0 K/uL   Eosinophils Relative 16 %   Eosinophils Absolute 1.5 (H) 0.0 - 0.5 K/uL   Basophils Relative 1 %   Basophils Absolute 0.1 0.0 - 0.1 K/uL   Immature Granulocytes 0 %   Abs Immature Granulocytes 0.01 0.00 - 0.07 K/uL    Comment: Performed at [place] [address] [place], [place] [address]  Troponin I (High Sensitivity)     Status: None   Collection Time: 03/20/19  4:09 PM  Result Value Ref Range   Troponin I (High Sensitivity) 11 <18 ng/L    Comment: (NOTE) Elevated high sensitivity troponin I (hsTnI) values and significant  changes across serial measurements may suggest ACS but many other  chronic and acute conditions are known to elevate hsTnI results.  Refer  to the "Links" section for chest pain algorithms and additional  guidance. Performed at [facility], [place] [address], [place], [place] [address]   Brain natriuretic peptide     Status: None   Collection Time: 03/20/19  4:09 PM  Result Value Ref Range   B Natriuretic Peptide 73.1 0.0 - 100.0 pg/mL    Comment: Performed at [place] [address] [place], [place] [address]  D-dimer, quantitative (not at [facility])     Status: None   Collection Time: 03/20/19  4:20 PM  Result Value Ref Range   D-Dimer, Quant 0.49 0.00 - 0.50 ug/mL-FEU    Comment: (NOTE) At the manufacturer cut-off of 0.50 ug/mL FEU, this assay has been documented to exclude PE with a sensitivity and negative predictive value of 97 to 99%.  At this time, this assay has not been approved by the FDA to exclude DVT/VTE. Results should be correlated with clinical presentation. Performed at [facility], [place] [address] [place], [place] [address]   Troponin I (High Sensitivity)     Status: None   Collection Time: 03/20/19  5:24 PM  Result Value Ref Range   Troponin I (High Sensitivity) 10 <18 ng/L    Comment: (NOTE) Elevated high sensitivity troponin I (hsTnI) values and significant  changes across serial measurements may suggest ACS but many other  chronic and acute conditions are known to elevate hsTnI results.  Refer to the "Links" section for chest pain algorithms and additional  guidance. Performed at [facility], [place] [address] [place], [place] [address]    Dg Chest 2 View  Result Date: 03/20/2019 CLINICAL DATA:  Shortness of breath with chest tightness 2 weeks. EXAM: CHEST - 2 VIEW COMPARISON:  11/01/2015 FINDINGS: Left-sided pacemaker unchanged. Lungs are adequately inflated without airspace consolidation or effusion. Cardiomediastinal silhouette and remainder of the exam is unchanged. IMPRESSION: No active cardiopulmonary disease. Electronically Signed   By: Marin Olp M.D.   On:  03/20/2019 13:27    Pending Labs [employer] (From admission, onward)    Start     Ordered   Signed and Held  HIV antibody  Once,   [employer] and  Held   Signed and Held  CBC  Tomorrow morning,   R     Signed and Held   Signed and Held  Basic metabolic panel  Tomorrow morning,   R     Signed and Held   Signed and Held  Hemoglobin A1c  Once,   R    Comments: To assess prior glycemic control    Signed and Held          Vitals/Pain Today's Vitals   03/20/19 1845 03/20/19 1847 03/20/19 1900 03/20/19 1942  BP: (!) 126/52  112/68   Pulse: (!) 59  61   Resp: (!) 23  (!) 27   Temp:      TempSrc:      SpO2: 98%  97% 99%  PainSc:  0-No pain      Isolation Precautions No active isolations  Medications Medications  albuterol (VENTOLIN HFA) 108 (90 Base) MCG/ACT inhaler 8 puff (8 puffs Inhalation Given 03/20/19 1534)  AeroChamber Plus Flo-Vu Medium MISC 1 each (1 each Other Given 03/20/19 1533)  albuterol (PROVENTIL,VENTOLIN) solution continuous neb (10 mg/hr Nebulization Given 03/20/19 1938)  ipratropium (ATROVENT) nebulizer solution 0.5 mg (0.5 mg Nebulization Given 03/20/19 1931)  methylPREDNISolone sodium succinate (SOLU-MEDROL) 125 mg/2 mL injection 125 mg (125 mg Intravenous Given 03/20/19 1930)  magnesium sulfate IVPB 2 g 50 mL (2 g Intravenous New Bag/Given 03/20/19 1939)    Mobility walks Low fall risk   Focused Assessments Pulmonary Assessment Handoff:  Lung sounds: Bilateral Breath Sounds: Expiratory wheezes, Diminished O2 Device: Nasal Cannula O2 Flow Rate (L/min): 2 L/min      R Recommendations: See Admitting Provider Note  Report given to: Cyril Mourning, RN  Additional Notes:

## 2019-03-21 ENCOUNTER — Other Ambulatory Visit: Payer: Self-pay

## 2019-03-21 ENCOUNTER — Encounter (HOSPITAL_COMMUNITY): Payer: Self-pay

## 2019-03-21 DIAGNOSIS — I10 Essential (primary) hypertension: Secondary | ICD-10-CM | POA: Diagnosis not present

## 2019-03-21 DIAGNOSIS — J9601 Acute respiratory failure with hypoxia: Secondary | ICD-10-CM | POA: Diagnosis not present

## 2019-03-21 DIAGNOSIS — I5032 Chronic diastolic (congestive) heart failure: Secondary | ICD-10-CM | POA: Diagnosis present

## 2019-03-21 DIAGNOSIS — Z7984 Long term (current) use of oral hypoglycemic drugs: Secondary | ICD-10-CM | POA: Diagnosis not present

## 2019-03-21 DIAGNOSIS — R0602 Shortness of breath: Secondary | ICD-10-CM | POA: Diagnosis present

## 2019-03-21 DIAGNOSIS — Z79899 Other long term (current) drug therapy: Secondary | ICD-10-CM | POA: Diagnosis not present

## 2019-03-21 DIAGNOSIS — Z7951 Long term (current) use of inhaled steroids: Secondary | ICD-10-CM | POA: Diagnosis not present

## 2019-03-21 DIAGNOSIS — R0902 Hypoxemia: Secondary | ICD-10-CM | POA: Diagnosis not present

## 2019-03-21 DIAGNOSIS — Z8249 Family history of ischemic heart disease and other diseases of the circulatory system: Secondary | ICD-10-CM | POA: Diagnosis not present

## 2019-03-21 DIAGNOSIS — Z79891 Long term (current) use of opiate analgesic: Secondary | ICD-10-CM | POA: Diagnosis not present

## 2019-03-21 DIAGNOSIS — J9600 Acute respiratory failure, unspecified whether with hypoxia or hypercapnia: Secondary | ICD-10-CM | POA: Diagnosis present

## 2019-03-21 DIAGNOSIS — J9621 Acute and chronic respiratory failure with hypoxia: Secondary | ICD-10-CM | POA: Diagnosis present

## 2019-03-21 DIAGNOSIS — Z87891 Personal history of nicotine dependence: Secondary | ICD-10-CM | POA: Diagnosis not present

## 2019-03-21 DIAGNOSIS — E1165 Type 2 diabetes mellitus with hyperglycemia: Secondary | ICD-10-CM | POA: Diagnosis present

## 2019-03-21 DIAGNOSIS — I272 Pulmonary hypertension, unspecified: Secondary | ICD-10-CM | POA: Diagnosis present

## 2019-03-21 DIAGNOSIS — Z8041 Family history of malignant neoplasm of ovary: Secondary | ICD-10-CM | POA: Diagnosis not present

## 2019-03-21 DIAGNOSIS — Z20828 Contact with and (suspected) exposure to other viral communicable diseases: Secondary | ICD-10-CM | POA: Diagnosis present

## 2019-03-21 DIAGNOSIS — I110 Hypertensive heart disease with heart failure: Secondary | ICD-10-CM | POA: Diagnosis present

## 2019-03-21 DIAGNOSIS — J209 Acute bronchitis, unspecified: Secondary | ICD-10-CM | POA: Diagnosis present

## 2019-03-21 DIAGNOSIS — I442 Atrioventricular block, complete: Secondary | ICD-10-CM | POA: Diagnosis present

## 2019-03-21 DIAGNOSIS — T380X5A Adverse effect of glucocorticoids and synthetic analogues, initial encounter: Secondary | ICD-10-CM | POA: Diagnosis present

## 2019-03-21 DIAGNOSIS — G4733 Obstructive sleep apnea (adult) (pediatric): Secondary | ICD-10-CM | POA: Diagnosis present

## 2019-03-21 DIAGNOSIS — J440 Chronic obstructive pulmonary disease with acute lower respiratory infection: Secondary | ICD-10-CM | POA: Diagnosis present

## 2019-03-21 DIAGNOSIS — J441 Chronic obstructive pulmonary disease with (acute) exacerbation: Secondary | ICD-10-CM | POA: Diagnosis present

## 2019-03-21 DIAGNOSIS — Z825 Family history of asthma and other chronic lower respiratory diseases: Secondary | ICD-10-CM | POA: Diagnosis not present

## 2019-03-21 DIAGNOSIS — Z950 Presence of cardiac pacemaker: Secondary | ICD-10-CM | POA: Diagnosis not present

## 2019-03-21 DIAGNOSIS — Z6832 Body mass index (BMI) 32.0-32.9, adult: Secondary | ICD-10-CM | POA: Diagnosis not present

## 2019-03-21 DIAGNOSIS — E785 Hyperlipidemia, unspecified: Secondary | ICD-10-CM | POA: Diagnosis present

## 2019-03-21 LAB — BASIC METABOLIC PANEL
Anion gap: 14 (ref 5–15)
BUN: 20 mg/dL (ref 8–23)
CO2: 23 mmol/L (ref 22–32)
Calcium: 9.6 mg/dL (ref 8.9–10.3)
Chloride: 103 mmol/L (ref 98–111)
Creatinine, Ser: 0.96 mg/dL (ref 0.44–1.00)
GFR calc Af Amer: >60 mL/min (ref >60)
GFR calc non Af Amer: >60 mL/min (ref >60)
Glucose, Bld: 305 mg/dL — ABNORMAL HIGH (ref 70–99)
Potassium: 4 mmol/L (ref 3.5–5.1)
Sodium: 140 mmol/L (ref 135–145)

## 2019-03-21 LAB — CBC
HCT: 41.1 % (ref 36.0–46.0)
Hemoglobin: 13.2 g/dL (ref 12.0–15.0)
MCH: 27.6 pg (ref 26.0–34.0)
MCHC: 32.1 g/dL (ref 30.0–36.0)
MCV: 86 fL (ref 80.0–100.0)
Platelets: 250 10*3/uL (ref 150–400)
RBC: 4.78 MIL/uL (ref 3.87–5.11)
RDW: 13.5 % (ref 11.5–15.5)
WBC: 7.8 10*3/uL (ref 4.0–10.5)
nRBC: 0 % (ref 0.0–0.2)

## 2019-03-21 LAB — GLUCOSE, CAPILLARY
Glucose-Capillary: 225 mg/dL — ABNORMAL HIGH (ref 70–99)
Glucose-Capillary: 246 mg/dL — ABNORMAL HIGH (ref 70–99)
Glucose-Capillary: 255 mg/dL — ABNORMAL HIGH (ref 70–99)
Glucose-Capillary: 242 mg/dL — ABNORMAL HIGH (ref 70–99)

## 2019-03-21 LAB — HIV ANTIBODY (ROUTINE TESTING W REFLEX): HIV Screen 4th Generation wRfx: NONREACTIVE

## 2019-03-21 MED ORDER — GUAIFENESIN ER 600 MG PO TB12
600.0000 mg | ORAL_TABLET | Freq: Two times a day (BID) | ORAL | Status: DC
  Administered 2019-03-21 – 2019-03-24 (×7): 600 mg via ORAL
  Filled 2019-03-21 (×7): qty 1

## 2019-03-21 MED ORDER — ENOXAPARIN SODIUM 40 MG/0.4ML ~~LOC~~ SOLN
40.0000 mg | SUBCUTANEOUS | Status: DC
  Administered 2019-03-21 – 2019-03-23 (×3): 40 mg via SUBCUTANEOUS
  Filled 2019-03-21 (×3): qty 0.4

## 2019-03-21 MED ORDER — METHYLPREDNISOLONE SODIUM SUCC 40 MG IJ SOLR
40.0000 mg | Freq: Four times a day (QID) | INTRAMUSCULAR | Status: DC
  Administered 2019-03-21 – 2019-03-24 (×10): 40 mg via INTRAVENOUS
  Filled 2019-03-21 (×10): qty 1

## 2019-03-21 NOTE — Progress Notes (Signed)
PT Cancellation Note  Patient Details Name: Erika Leach MRN: [id number] DOB: January 17, 1951   Cancelled Treatment:    Reason Eval/Treat Not Completed: Other (comment). Pt had just amb with nursing in hallway. Will follow up tomorrow.   Shary Decamp [facility] 03/21/2019, 2:30 PM Justiss Gerbino [place] Pager [phone] Office [phone]

## 2019-03-21 NOTE — Plan of Care (Signed)
  Problem: Education: Goal: Knowledge of General Education information will improve Description: Including pain rating scale, medication(s)/side effects and non-pharmacologic comfort measures Outcome: Progressing   Problem: Activity: Goal: Risk for activity intolerance will decrease Outcome: Not Progressing   Problem: Coping: Goal: Level of anxiety will decrease Outcome: Progressing   Problem: Pain Managment: Goal: General experience of comfort will improve Outcome: Progressing

## 2019-03-21 NOTE — Progress Notes (Signed)
SATURATION QUALIFICATIONS: (This note is used to comply with regulatory documentation for home oxygen)  Patient Saturations on Room Air at Rest = 96%  Patient Saturations on Room Air while Ambulating = 76%  Patient Saturations on 3 Liters of oxygen while Ambulating = 89-92%  Please briefly explain why patient needs home oxygen: Patient desats rapidly while ambulating if no supplemental oxygen is available.

## 2019-03-21 NOTE — Progress Notes (Signed)
Progress Note    Erika Leach  [id number] DOB: Sep 12, 1950  DOA: 03/20/2019 PCP: Antonietta Jewel, MD    Brief Narrative:     Medical records reviewed and are as summarized below:  Erika Leach is an 68 y.o. female  with medical history significant of COPD with asthma, diastolic dysfunction CHF, history of complete heart block, hypertension, status post pacemaker placement who presented to the ER with progressive shortness of breath and wheezing.  Symptoms been going on for 3 days.  Patient has tried her home nebulizer treatments as well as inhalers but no relief.  She denied any chest pain.  No sick contacts.  Symptoms associated with cough but nonproductive.  No fever or chills.  No exposure to COVID-19.  Patient has received treatment in the ER with minimal relief.  She is being admitted for treatment of acute exacerbation of her COPD.   Assessment/Plan:   Principal Problem:   COPD exacerbation (HCC) Active Problems:   Essential hypertension   Mild pulmonary hypertension (HCC)   Pacemaker-[employer]   OSA (obstructive sleep apnea)   Morbid obesity (HCC)   Acute respiratory failure due to acute exacerbation of COPD:  -O2 sat down to 86% on room air per ER note -IV steroids -nebs -wean O2 as tolerated -home O2 study prior to d/c -2017 seen by Dr. Melvyn Novas: PFT's 10/19/14   FEV1 1.24 (57 % ) ratio 53  p 9 % improvement from saba p ?  prior to study with DLCO  38 % corrects to 52 % for alv volume   - 11/29/2015  After extensive coaching HFA effectiveness =    75% > try bevespi GROUP B guidelines rec laba/lama and she is already familiar with hfa though certainly room for improvement in terms of technique   essential hypertension:  Continue home regimen   DM with hyperglycemia -steroids worsening -SSI -continue PO -adjust as able  history of pacemaker placement:  -No new issues.  obstructive sleep apnea:  -CPAP at night recommended   morbid obesity:  Body mass  index is 32.39 kg/m.   Family Communication/Anticipated D/C date and plan/Code Status   DVT prophylaxis: Lovenox ordered. Code Status: Full Code.  Family Communication: at bedside Disposition Plan: needs to be weaned off O2, less work of breathing with movement-- needs to continue IV steroids and breathing treatments in the hospital so not appropriate for discharge   Medical Consultants:    None.    Subjective:   Feels "better" at rest but quickly de-sats/tires with movement  Objective:    Vitals:   03/21/19 0413 03/21/19 0620 03/21/19 0723 03/21/19 0811  BP: 132/79   (!) 143/76  Pulse: 66 60  60  Resp: 20 (!) 25  16  Temp: 98.2 F (36.8 C)   98.1 F (36.7 C)  TempSrc: Oral   Oral  SpO2:  96% 96% 93%  Weight:        Intake/Output Summary (Last 24 hours) at 03/21/2019 1155 Last data filed at 03/21/2019 1129 Gross per 24 hour  Intake 933.48 ml  Output -  Net 933.48 ml   Filed Weights   03/20/19 2100  Weight: 88.3 kg    Exam: In bed, increased work of breathing with movement Coarse breath sounds, not moving much air rrr A+Ox3 After speaking had to position self in tripod to be comfortable  Data Reviewed:   I have personally reviewed following labs and imaging studies:  Labs: Labs show the following:  Basic Metabolic Panel: Recent Labs  Lab 03/20/19 1609 03/21/19 0123  NA 142 140  K 3.8 4.0  CL 101 103  CO2 30 23  GLUCOSE 96 305*  BUN 15 20  CREATININE 0.85 0.96  CALCIUM 10.4* 9.6   GFR CrCl cannot be calculated (Unknown ideal weight.). Liver Function Tests: Recent Labs  Lab 03/20/19 1609  AST 20  ALT 19  ALKPHOS 81  BILITOT 0.6  PROT 8.2*  ALBUMIN 4.9   No results for input(s): LIPASE, AMYLASE in the last 168 hours. No results for input(s): AMMONIA in the last 168 hours. Coagulation profile No results for input(s): INR, PROTIME in the last 168 hours.  CBC: Recent Labs  Lab 03/20/19 1609 03/21/19 0123  WBC 9.3 7.8   NEUTROABS 3.6  --   HGB 14.8 13.2  HCT 46.1* 41.1  MCV 87.0 86.0  PLT 265 250   Cardiac Enzymes: No results for input(s): CKTOTAL, CKMB, CKMBINDEX, TROPONINI in the last 168 hours. BNP (last 3 results) No results for input(s): PROBNP in the last 8760 hours. CBG: Recent Labs  Lab 03/20/19 2328 03/21/19 0812 03/21/19 1137  GLUCAP 257* 225* 246*   D-Dimer: Recent Labs    03/20/19 1620  DDIMER 0.49   Hgb A1c: No results for input(s): HGBA1C in the last 72 hours. Lipid Profile: No results for input(s): CHOL, HDL, LDLCALC, TRIG, CHOLHDL, LDLDIRECT in the last 72 hours. Thyroid function studies: No results for input(s): TSH, T4TOTAL, T3FREE, THYROIDAB in the last 72 hours.  Invalid input(s): FREET3 Anemia work up: No results for input(s): VITAMINB12, FOLATE, FERRITIN, TIBC, IRON, RETICCTPCT in the last 72 hours. Sepsis Labs: Recent Labs  Lab 03/20/19 1609 03/21/19 0123  WBC 9.3 7.8    Microbiology Recent Results (from the past 240 hour(s))  SARS Coronavirus 2 [facility] order, Performed in [facility] hospital lab) Nasopharyngeal Nasopharyngeal Swab     Status: None   Collection Time: 03/20/19  3:37 PM   Specimen: Nasopharyngeal Swab  Result Value Ref Range Status   SARS Coronavirus 2 NEGATIVE NEGATIVE Final    Comment: (NOTE) If result is NEGATIVE SARS-CoV-2 target nucleic acids are NOT DETECTED. The SARS-CoV-2 RNA is generally detectable in upper and lower  respiratory specimens during the acute phase of infection. The lowest  concentration of SARS-CoV-2 viral copies this assay can detect is 250  copies / mL. A negative result does not preclude SARS-CoV-2 infection  and should not be used as the sole basis for treatment or other  patient management decisions.  A negative result may occur with  improper specimen collection / handling, submission of specimen other  than nasopharyngeal swab, presence of viral mutation(s) within the  areas targeted by this assay,  and inadequate number of viral copies  (<250 copies / mL). A negative result must be combined with clinical  observations, patient history, and epidemiological information. If result is POSITIVE SARS-CoV-2 target nucleic acids are DETECTED. The SARS-CoV-2 RNA is generally detectable in upper and lower  respiratory specimens dur ing the acute phase of infection.  Positive  results are indicative of active infection with SARS-CoV-2.  Clinical  correlation with patient history and other diagnostic information is  necessary to determine patient infection status.  Positive results do  not rule out bacterial infection or co-infection with other viruses. If result is PRESUMPTIVE POSTIVE SARS-CoV-2 nucleic acids MAY BE PRESENT.   A presumptive positive result was obtained on the submitted specimen  and confirmed on repeat testing.  While  2019 novel coronavirus  (SARS-CoV-2) nucleic acids may be present in the submitted sample  additional confirmatory testing may be necessary for epidemiological  and / or clinical management purposes  to differentiate between  SARS-CoV-2 and other Sarbecovirus currently known to infect humans.  If clinically indicated additional testing with an alternate test  methodology [phone]) is advised. The SARS-CoV-2 RNA is generally  detectable in upper and lower respiratory sp ecimens during the acute  phase of infection. The expected result is Negative. Fact Sheet for Patients:  StrictlyIdeas.no Fact Sheet for Healthcare Providers: BankingDealers.co.za This test is not yet approved or cleared by the [place] FDA and has been authorized for detection and/or diagnosis of SARS-CoV-2 by FDA under an Emergency Use Authorization (EUA).  This EUA will remain in effect (meaning this test can be used) for the duration of the COVID-19 declaration under Section 564(b)(1) of the Act, 21 U.S.C. section 360bbb-3(b)(1), unless  the authorization is terminated or revoked sooner. Performed at [facility], [place] [address], [place], [place] [address]     Procedures and diagnostic studies:  Dg Chest 2 View  Result Date: 03/20/2019 CLINICAL DATA:  Shortness of breath with chest tightness 2 weeks. EXAM: CHEST - 2 VIEW COMPARISON:  11/01/2015 FINDINGS: Left-sided pacemaker unchanged. Lungs are adequately inflated without airspace consolidation or effusion. Cardiomediastinal silhouette and remainder of the exam is unchanged. IMPRESSION: No active cardiopulmonary disease. Electronically Signed   By: Marin Olp M.D.   On: 03/20/2019 13:27    Medications:   . amLODipine  10 mg Oral Daily  . atenolol  50 mg Oral BID  . cloNIDine  0.1 mg Oral BID  . gabapentin  300 mg Oral TID  . glipiZIDE  5 mg Oral Q breakfast  . guaiFENesin  600 mg Oral BID  . insulin aspart  0-20 Units Subcutaneous TID WC  . insulin aspart  0-5 Units Subcutaneous QHS  . methylPREDNISolone (SOLU-MEDROL) injection  80 mg Intravenous Q6H  . [START ON 03/22/2019] methylPREDNISolone (SOLU-MEDROL) injection  40 mg Intravenous Q6H  . mometasone-formoterol  2 puff Inhalation BID  . pravastatin  20 mg Oral q1800   Continuous Infusions: . cefTRIAXone (ROCEPHIN)  IV 1 g (03/21/19 0156)     LOS: 0 days   Geradine Girt  [facility]   How to contact the [facility] Attending or Consulting provider [place] or covering provider during after hours [place], for this patient?  1. Check the care team in [facility] and look for a) attending/consulting TR[facility] provider listed and b) the [facility] team listed 2. Log into [email or web address] and use [place]'s universal password to access. If you do not have the password, please contact the hospital operator. 3. Locate the [facility] provider you are looking for under [facility] and page to a number that you can be directly reached. 4. If you still have difficulty reaching the provider, please page the [facility] (Director on Call)  for the Hospitalists listed on amion for assistance.  03/21/2019, 11:55 AM

## 2019-03-22 DIAGNOSIS — Z950 Presence of cardiac pacemaker: Secondary | ICD-10-CM

## 2019-03-22 DIAGNOSIS — I10 Essential (primary) hypertension: Secondary | ICD-10-CM

## 2019-03-22 DIAGNOSIS — I272 Pulmonary hypertension, unspecified: Secondary | ICD-10-CM

## 2019-03-22 DIAGNOSIS — R0902 Hypoxemia: Secondary | ICD-10-CM

## 2019-03-22 LAB — GLUCOSE, CAPILLARY
Glucose-Capillary: 226 mg/dL — ABNORMAL HIGH (ref 70–99)
Glucose-Capillary: 280 mg/dL — ABNORMAL HIGH (ref 70–99)
Glucose-Capillary: 281 mg/dL — ABNORMAL HIGH (ref 70–99)
Glucose-Capillary: 206 mg/dL — ABNORMAL HIGH (ref 70–99)

## 2019-03-22 LAB — HEMOGLOBIN A1C
Hgb A1c MFr Bld: 7.8 % — ABNORMAL HIGH (ref 4.8–5.6)
Mean Plasma Glucose: 177 mg/dL

## 2019-03-22 MED ORDER — INSULIN ASPART 100 UNIT/ML ~~LOC~~ SOLN
3.0000 [IU] | Freq: Three times a day (TID) | SUBCUTANEOUS | Status: DC
  Administered 2019-03-22 – 2019-03-23 (×3): 3 [IU] via SUBCUTANEOUS

## 2019-03-22 MED ORDER — ZOLPIDEM TARTRATE 5 MG PO TABS
5.0000 mg | ORAL_TABLET | Freq: Every evening | ORAL | Status: DC | PRN
  Administered 2019-03-22: 5 mg via ORAL
  Filled 2019-03-22: qty 1

## 2019-03-22 NOTE — Progress Notes (Signed)
PROGRESS NOTE    Erika Leach  [id number] DOB: [birth year] DOA: 03/20/2019 PCP: Antonietta Jewel, MD   Brief Narrative:  68 year old lady with prior history of COPD, diastolic heart failure, asthma, history of complete heart block status post pacemaker, hypertension presents to ED with shortness of breath and wheezing for 3 days without any relief.  She was admitted for evaluation and management of acute exacerbation of COPD. Assessment & Plan:   Principal Problem:   COPD exacerbation ([place]) Active Problems:   Essential hypertension   Mild pulmonary hypertension (HCC)   Pacemaker-[employer]   OSA (obstructive sleep apnea)   Morbid obesity ([place])   Acute respiratory failure (HCC)   Acute COPD exacerbation Improving but patient not back to baseline yet. Continue with IV Solu-Medrol and duo nebs and Dulera. Continue with IV Rocephin for acute bronchitis Nasal cannula oxygen to keep sats greater than 90%, patient continues to require up to 1 L of nasal cannula to keep sats. Check ambulating oxygen levels.    Chronic diastolic heart failure Echocardiogram from [id number] shows Systolic function was normal.   The estimated ejection fraction was in the range of 60% to 65%.   Doppler parameters are consistent with abnormal left ventricular   relaxation (grade 1 diastolic dysfunction). - Pulmonary arteries: Systolic pressure was moderately increased.   PA peak pressure: 42 mm Hg (S). She appears to be compensated at this time.  History of heart block s/p PPM Continue with atenolol   Type 2 diabetes mellitus CBG (last 3)  Recent Labs    03/21/19 1616 03/21/19 2102 03/22/19 0725  GLUCAP 255* 242* 226*   Uncontrolled probably secondary to IV steroids.  Patient is on resistant SSI and glipizide. We will added NovoLog 3 units 3 times daily AC for better control. A1c ordered.  Essential hypertension Well-controlled blood pressure parameters Continue with Norvasc   Morbid obesity  Recommend outpatient follow-up with PCP for diet control and weight loss.   Mild pulmonary hypertension  DVT prophylaxis: Lovenox Code Status: Full code Family Communication: None at bedside  disposition Plan: Pending clinical improvement and PT evaluation   Consultants:   None  Procedures: None Antimicrobials: Rocephin  Subjective: Patient requesting sleep aid, still feels short of breath.  She reports her breathing has improved compared to admission but she is still not back to baseline yet.  Objective: Vitals:   03/21/19 1617 03/21/19 2120 03/21/19 2157 03/22/19 0724  BP: 127/68  (!) 153/75 129/77  Pulse: 60 64 62 60  Resp: 16 16 20    Temp: 98.1 F (36.7 C)  98.5 F (36.9 C) 97.6 F (36.4 C)  TempSrc: Oral  Oral Oral  SpO2: 96% 97% 100% 92%  Weight:      Height:        Intake/Output Summary (Last 24 hours) at 03/22/2019 0938 Last data filed at 03/22/2019 0556 Gross per 24 hour  Intake 983.48 ml  Output -  Net 983.48 ml   Filed Weights   03/20/19 2100 03/21/19 1500  Weight: 88.3 kg 88.3 kg    Examination:  General exam: Mild distress from shortness of breath Respiratory system: Bilateral expiratory and inspiratory wheezing, tachypnea, diminished air entry at bases Cardiovascular system: S1 & S2 heard, RRR.  No pedal edema. Gastrointestinal system: Abdomen is nondistended, soft and nontender. No organomegaly or masses felt. Normal bowel sounds heard. Central nervous system: Alert and oriented. No focal neurological deficits. Extremities: Symmetric 5 x 5 power. Skin: No rashes, lesions or ulcers Psychiatry: .  Slightly anxious    Data Reviewed: I have personally reviewed following labs and imaging studies  CBC: Recent Labs  Lab 03/20/19 1609 03/21/19 0123  WBC 9.3 7.8  NEUTROABS 3.6  --   HGB 14.8 13.2  HCT 46.1* 41.1  MCV 87.0 86.0  PLT 265 [id number]   Basic Metabolic Panel: Recent Labs  Lab 03/20/19 1609 03/21/19 0123  NA 142 140  K 3.8 4.0   CL 101 103  CO2 30 23  GLUCOSE 96 305*  BUN 15 20  CREATININE 0.85 0.96  CALCIUM 10.4* 9.6   GFR: Estimated Creatinine Clearance: 61.5 mL/min (by C-G formula based on SCr of 0.96 mg/dL). Liver Function Tests: Recent Labs  Lab 03/20/19 1609  AST 20  ALT 19  ALKPHOS 81  BILITOT 0.6  PROT 8.2*  ALBUMIN 4.9   No results for input(s): LIPASE, AMYLASE in the last 168 hours. No results for input(s): AMMONIA in the last 168 hours. Coagulation Profile: No results for input(s): INR, PROTIME in the last 168 hours. Cardiac Enzymes: No results for input(s): CKTOTAL, CKMB, CKMBINDEX, TROPONINI in the last 168 hours. BNP (last 3 results) No results for input(s): PROBNP in the last 8760 hours. HbA1C: No results for input(s): HGBA1C in the last 72 hours. CBG: Recent Labs  Lab 03/21/19 0812 03/21/19 1137 03/21/19 1616 03/21/19 2102 03/22/19 0725  GLUCAP 225* 246* 255* 242* 226*   Lipid Profile: No results for input(s): CHOL, HDL, LDLCALC, TRIG, CHOLHDL, LDLDIRECT in the last 72 hours. Thyroid Function Tests: No results for input(s): TSH, T4TOTAL, FREET4, T3FREE, THYROIDAB in the last 72 hours. Anemia Panel: No results for input(s): VITAMINB12, FOLATE, FERRITIN, TIBC, IRON, RETICCTPCT in the last 72 hours. Sepsis Labs: No results for input(s): PROCALCITON, LATICACIDVEN in the last 168 hours.  Recent Results (from the past 240 hour(s))  SARS Coronavirus 2 [facility] order, Performed in [facility] hospital lab) Nasopharyngeal Nasopharyngeal Swab     Status: None   Collection Time: 03/20/19  3:37 PM   Specimen: Nasopharyngeal Swab  Result Value Ref Range Status   SARS Coronavirus 2 NEGATIVE NEGATIVE Final    Comment: (NOTE) If result is NEGATIVE SARS-CoV-2 target nucleic acids are NOT DETECTED. The SARS-CoV-2 RNA is generally detectable in upper and lower  respiratory specimens during the acute phase of infection. The lowest  concentration of SARS-CoV-2 viral copies this assay  can detect is 250  copies / mL. A negative result does not preclude SARS-CoV-2 infection  and should not be used as the sole basis for treatment or other  patient management decisions.  A negative result may occur with  improper specimen collection / handling, submission of specimen other  than nasopharyngeal swab, presence of viral mutation(s) within the  areas targeted by this assay, and inadequate number of viral copies  (<250 copies / mL). A negative result must be combined with clinical  observations, patient history, and epidemiological information. If result is POSITIVE SARS-CoV-2 target nucleic acids are DETECTED. The SARS-CoV-2 RNA is generally detectable in upper and lower  respiratory specimens dur ing the acute phase of infection.  Positive  results are indicative of active infection with SARS-CoV-2.  Clinical  correlation with patient history and other diagnostic information is  necessary to determine patient infection status.  Positive results do  not rule out bacterial infection or co-infection with other viruses. If result is PRESUMPTIVE POSTIVE SARS-CoV-2 nucleic acids MAY BE PRESENT.   A presumptive positive result was obtained on the submitted specimen  and confirmed on repeat testing.  While 2019 novel coronavirus  (SARS-CoV-2) nucleic acids may be present in the submitted sample  additional confirmatory testing may be necessary for epidemiological  and / or clinical management purposes  to differentiate between  SARS-CoV-2 and other Sarbecovirus currently known to infect humans.  If clinically indicated additional testing with an alternate test  methodology [phone]) is advised. The SARS-CoV-2 RNA is generally  detectable in upper and lower respiratory sp ecimens during the acute  phase of infection. The expected result is Negative. Fact Sheet for Patients:  StrictlyIdeas.no Fact Sheet for Healthcare Providers:  BankingDealers.co.za This test is not yet approved or cleared by the [place] FDA and has been authorized for detection and/or diagnosis of SARS-CoV-2 by FDA under an Emergency Use Authorization (EUA).  This EUA will remain in effect (meaning this test can be used) for the duration of the COVID-19 declaration under Section 564(b)(1) of the Act, 21 U.S.C. section 360bbb-3(b)(1), unless the authorization is terminated or revoked sooner. Performed at [facility], [place] [address] [place], [place] [address]          Radiology Studies: Dg Chest 2 View  Result Date: 03/20/2019 CLINICAL DATA:  Shortness of breath with chest tightness 2 weeks. EXAM: CHEST - 2 VIEW COMPARISON:  11/01/2015 FINDINGS: Left-sided pacemaker unchanged. Lungs are adequately inflated without airspace consolidation or effusion. Cardiomediastinal silhouette and remainder of the exam is unchanged. IMPRESSION: No active cardiopulmonary disease. Electronically Signed   By: Marin Olp M.D.   On: 03/20/2019 13:27        Scheduled Meds: . amLODipine  10 mg Oral Daily  . atenolol  50 mg Oral BID  . cloNIDine  0.1 mg Oral BID  . enoxaparin (LOVENOX) injection  40 mg Subcutaneous Q24H  . gabapentin  300 mg Oral TID  . glipiZIDE  5 mg Oral Q breakfast  . guaiFENesin  600 mg Oral BID  . insulin aspart  0-20 Units Subcutaneous TID WC  . insulin aspart  0-5 Units Subcutaneous QHS  . methylPREDNISolone (SOLU-MEDROL) injection  40 mg Intravenous Q6H  . mometasone-formoterol  2 puff Inhalation BID  . pravastatin  20 mg Oral q1800   Continuous Infusions: . cefTRIAXone (ROCEPHIN)  IV Stopped (03/22/19 0556)     LOS: 1 day    Time spent:42 minutes.     Hosie Poisson, MD [facility] Pager [phone]   If 7PM-7AM, please contact night-coverage [email or web address] Password [facility] 03/22/2019, 9:38 AM

## 2019-03-22 NOTE — Evaluation (Signed)
Occupational Therapy Evaluation Patient Details Name: Erika Leach MRN: [id number] DOB: [birth year] Today's Date: 03/22/2019    History of Present Illness Pt adm with copd exacerbation. PMH - copd, asthma, diastolic dysfunction CHF, complete heart block, HTN, pacer   Clinical Impression   Pt PTA: living with significant other and independent. Pt reports SOB with daily tasks, but naturally uses energy conservation techniques. Pt currently, pt performing ADL functional mobility and transfers with modified independence in room with o2 tubing long enough to get in/out of bathroom. Pt standing at sink <2 mins due to SOB. 1L O2 >92% after exertion; HR 67 BPM with exertion. Energy conservation technique education provided. She plans to use shower chair at home for energy conservation.  Pt does not continued OT skilled services as pt is at her functional baseline for ADL.  OT signing off.    Follow Up Recommendations  No OT follow up    Equipment Recommendations  None recommended by OT    Recommendations for Other Services       Precautions / Restrictions Precautions Precautions: Fall Precaution Comments: monitor O2 sats Restrictions Weight Bearing Restrictions: No      Mobility Bed Mobility Overal bed mobility: Modified Independent                Transfers Overall transfer level: Modified independent Equipment used: None                  Balance Overall balance assessment: Needs assistance Sitting-balance support: Feet supported Sitting balance-Leahy Scale: Good     Standing balance support: No upper extremity supported Standing balance-Leahy Scale: Good Standing balance comment: fair with mobility/transfers                           ADL either performed or assessed with clinical judgement   ADL Overall ADL's : At baseline;Modified independent                                       General ADL Comments: Pt requiring rest breaks and  able to stand at sink <2 mins before requiring seated rest break     Vision Baseline Vision/History: Wears glasses Wears Glasses: Reading only Patient Visual Report: No change from baseline Vision Assessment?: No apparent visual deficits     Perception     Praxis      Pertinent Vitals/Pain Pain Assessment: No/denies pain     Hand Dominance Right   Extremity/Trunk Assessment Upper Extremity Assessment Upper Extremity Assessment: Overall WFL for tasks assessed   Lower Extremity Assessment Lower Extremity Assessment: Generalized weakness   Cervical / Trunk Assessment Cervical / Trunk Assessment: Normal   Communication Communication Communication: No difficulties   Cognition Arousal/Alertness: Awake/alert Behavior During Therapy: WFL for tasks assessed/performed Overall Cognitive Status: Within Functional Limits for tasks assessed                                     General Comments  1L O2 >92% after exertion. Energy conservation technique education provided.    Exercises Exercises: General Lower Extremity   Shoulder Instructions      Home Living Family/patient expects to be discharged to:: Private residence Living Arrangements: Spouse/significant other Available Help at Discharge: Family;Available 24 hours/day Type of Home: House Home Access: Stairs to enter  Home Layout: One level     Bathroom Shower/Tub: Teacher, early years/pre: Standard     Home Equipment: Shower seat          Prior Functioning/Environment Level of Independence: Independent        Comments: pt had graduated from cardiac rehab last year        OT Problem List: Decreased activity tolerance      OT Treatment/Interventions:      OT Goals(Current goals can be found in the care plan section) Acute Rehab OT Goals Patient Stated Goal: to get her energy back OT Goal Formulation: With patient Time For Goal Achievement: 04/05/19 Potential to Achieve  Goals: Good  OT Frequency:     Barriers to D/C:            Co-evaluation              AM-PAC OT "6 Clicks" Daily Activity     Outcome Measure Help from another person eating meals?: None Help from another person taking care of personal grooming?: None Help from another person toileting, which includes using toliet, bedpan, or urinal?: None Help from another person bathing (including washing, rinsing, drying)?: None Help from another person to put on and taking off regular upper body clothing?: None Help from another person to put on and taking off regular lower body clothing?: None 6 Click Score: 24   End of Session Equipment Utilized During Treatment: Gait belt Nurse Communication: Mobility status  Activity Tolerance: Patient tolerated treatment well Patient left: in chair;with call bell/phone within reach  OT Visit Diagnosis: Unsteadiness on feet (R26.81)                Time: [id number] OT Time Calculation (min): 20 min Charges:  OT General Charges $OT Visit: 1 Visit OT Evaluation $OT Eval Moderate Complexity: 1 Mod  Darryl Nestle) Marsa Aris OTR/L Acute Rehabilitation Services Pager: [phone] Office: [phone]   Audie Pinto 03/22/2019, 3:43 PM

## 2019-03-22 NOTE — Evaluation (Signed)
Physical Therapy Evaluation Patient Details Name: Erika Leach MRN: [id number] DOB: 10-Feb-1951 Today's Date: 03/22/2019   History of Present Illness  Pt adm with copd exacerbation. PMH - copd, asthma, diastolic dysfunction CHF, complete heart block, HTN, pacer  Clinical Impression  Pt was seen for evaluation of tolerances for gait and to ck strength.  Her LE strength is 4- hip abd and hams, 4 to 4+ otherwise.  Talked with her about sarcopenia, and pt is motivated to do therapy.  Her plan is to start with HHPT given her limited energy and to progress to outpatient care vs cardiac rehab resumption.  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  For now is comfortable with the change to HHPT and will follow acutely for these same goals.    Follow Up Recommendations Home health PT;Supervision - Intermittent    Equipment Recommendations  None recommended by PT    Recommendations for Other Services       Precautions / Restrictions Precautions Precautions: Fall Precaution Comments: monitor O2 sats and pulses Restrictions Weight Bearing Restrictions: No      Mobility  Bed Mobility Overal bed mobility: Modified Independent                Transfers Overall transfer level: Modified independent Equipment used: None                Ambulation/Gait Ambulation/Gait assistance: Min guard Gait Distance (Feet): 180 Feet Assistive device: None(has the rolling vitals cart but not using for support) Gait Pattern/deviations: Wide base of support;Decreased stride length;Step-through pattern Gait velocity: reduced Gait velocity interpretation: <1.31 ft/sec, indicative of household ambulator General Gait Details: pt is adjusting her gait but does not use any device for support, maintains O2 sats on 98% and pulses were as high as 121  Stairs            Wheelchair Mobility     Modified Rankin (Stroke Patients Only)       Balance Overall balance assessment: Needs assistance Sitting-balance support: Feet supported Sitting balance-Leahy Scale: Good     Standing balance support: No upper extremity supported Standing balance-Leahy Scale: Good Standing balance comment: fair with gait                             Pertinent Vitals/Pain Pain Assessment: No/denies pain    Home Living Family/patient expects to be discharged to:: Private residence Living Arrangements: Spouse/significant other Available Help at Discharge: Family;Available 24 hours/day Type of Home: House Home Access: Stairs to enter     Home Layout: One level Home Equipment: None      Prior Function Level of Independence: Independent         Comments: pt had graduated from cardiac rehab last year     Hand Dominance        Extremity/Trunk Assessment   Upper Extremity Assessment Upper Extremity Assessment: Overall WFL for tasks assessed    Lower Extremity Assessment Lower Extremity Assessment: Generalized weakness    Cervical / Trunk Assessment Cervical / Trunk Assessment: Normal  Communication   Communication: No difficulties  Cognition Arousal/Alertness: Awake/alert Behavior During Therapy: WFL for tasks assessed/performed Overall Cognitive Status: Within Functional Limits for tasks assessed  General Comments General comments (skin integrity, edema, etc.): noted significant wheezing with all effortful gait esp on last leg of the trip.  Pt is SOB but sats are controlled on room air.  Note pulses are higher with the effort    Exercises     Assessment/Plan    PT Assessment Patient needs continued PT services  PT Problem List Decreased strength;Decreased range of motion;Decreased activity tolerance;Decreased balance;Decreased mobility;Decreased coordination;Cardiopulmonary status limiting  activity;Obesity       PT Treatment Interventions DME instruction;Gait training;Stair training;Functional mobility training;Therapeutic activities;Therapeutic exercise;Balance training;Neuromuscular re-education;Patient/family education    PT Goals (Current goals can be found in the Care Plan section)  Acute Rehab PT Goals Patient Stated Goal: to get her energy back PT Goal Formulation: With patient Time For Goal Achievement: 04/05/19 Potential to Achieve Goals: Good    Frequency Min 3X/week   Barriers to discharge Decreased caregiver support home with just spouse     Co-evaluation               AM-PAC PT "6 Clicks" Mobility  Outcome Measure Help needed turning from your back to your side while in a flat bed without using bedrails?: None Help needed moving from lying on your back to sitting on the side of a flat bed without using bedrails?: A Little Help needed moving to and from a bed to a chair (including a wheelchair)?: A Little Help needed standing up from a chair using your arms (e.g., wheelchair or bedside chair)?: A Little Help needed to walk in hospital room?: A Little Help needed climbing 3-5 steps with a railing? : A Lot 6 Click Score: 18    End of Session Equipment Utilized During Treatment: Gait belt Activity Tolerance: Patient tolerated treatment well;Treatment limited secondary to medical complications (Comment) Patient left: in bed;with call bell/phone within reach(sitting on the bed) Nurse Communication: Mobility status PT Visit Diagnosis: Unsteadiness on feet (R26.81);Muscle weakness (generalized) (M62.81);Difficulty in walking, not elsewhere classified (R26.2)    Time: 1012-1040 PT Time Calculation (min) (ACUTE ONLY): 28 min   Charges:   PT Evaluation $PT Eval Moderate Complexity: 1 Mod PT Treatments $Gait Training: 8-22 mins       Ramond Dial 03/22/2019, 1:53 PM   Mee Hives, PT MS Acute Rehab Dept. Number: [place]

## 2019-03-22 NOTE — Progress Notes (Signed)
Nutrition Brief Note  RD consulted via COPD protocol.  Patient with insignificant weight loss over the past year. Pt reports no changes in appetite.  Wt Readings from Last 15 Encounters:  03/21/19 88.3 kg  02/25/18 89.4 kg  09/02/17 92.7 kg  07/10/17 90.7 kg  12/09/16 92.5 kg  12/08/15 91 kg  11/29/15 90.7 kg  11/01/15 91.1 kg  02/14/15 89.4 kg  11/23/14 87.8 kg  09/20/14 88.2 kg  09/14/13 88.1 kg  07/09/13 88 kg  04/30/13 87.1 kg  04/30/13 87.1 kg    Body mass index is 32.39 kg/m. Patient meets criteria for obesity based on current BMI.   Current diet order is CHO modified. Labs and medications reviewed.   No nutrition interventions warranted at this time. If nutrition issues arise, please consult RD.   Clayton Bibles, MS, RD, LDN Inpatient Clinical Dietitian Pager: [phone] After Hours Pager: [phone]

## 2019-03-22 NOTE — Progress Notes (Signed)
Pt stable. Vital signs stable. Pt ambulated twice today. She is down to 1 L oxygen  and ambulated with 1L from her room to around the nursing station and back to her room. she desated when she got back to her door to 86% because she was tired and SOB, but that was the longest she has walked so far.Will continue to monitor.

## 2019-03-23 DIAGNOSIS — R0902 Hypoxemia: Secondary | ICD-10-CM

## 2019-03-23 DIAGNOSIS — J9601 Acute respiratory failure with hypoxia: Secondary | ICD-10-CM

## 2019-03-23 LAB — BASIC METABOLIC PANEL
Anion gap: 14 (ref 5–15)
BUN: 22 mg/dL (ref 8–23)
CO2: 27 mmol/L (ref 22–32)
Calcium: 9.4 mg/dL (ref 8.9–10.3)
Chloride: 101 mmol/L (ref 98–111)
Creatinine, Ser: 0.77 mg/dL (ref 0.44–1.00)
GFR calc Af Amer: >60 mL/min (ref >60)
GFR calc non Af Amer: >60 mL/min (ref >60)
Glucose, Bld: 254 mg/dL — ABNORMAL HIGH (ref 70–99)
Potassium: 3.5 mmol/L (ref 3.5–5.1)
Sodium: 142 mmol/L (ref 135–145)

## 2019-03-23 LAB — GLUCOSE, CAPILLARY
Glucose-Capillary: 216 mg/dL — ABNORMAL HIGH (ref 70–99)
Glucose-Capillary: 272 mg/dL — ABNORMAL HIGH (ref 70–99)
Glucose-Capillary: 323 mg/dL — ABNORMAL HIGH (ref 70–99)
Glucose-Capillary: 316 mg/dL — ABNORMAL HIGH (ref 70–99)

## 2019-03-23 MED ORDER — INSULIN ASPART 100 UNIT/ML ~~LOC~~ SOLN
4.0000 [IU] | Freq: Three times a day (TID) | SUBCUTANEOUS | Status: DC
  Administered 2019-03-23 – 2019-03-24 (×4): 4 [IU] via SUBCUTANEOUS

## 2019-03-23 MED ORDER — IPRATROPIUM-ALBUTEROL 0.5-2.5 (3) MG/3ML IN SOLN
3.0000 mL | Freq: Two times a day (BID) | RESPIRATORY_TRACT | Status: DC
  Administered 2019-03-24: 3 mL via RESPIRATORY_TRACT
  Filled 2019-03-23: qty 3

## 2019-03-23 MED ORDER — IPRATROPIUM-ALBUTEROL 0.5-2.5 (3) MG/3ML IN SOLN
3.0000 mL | Freq: Four times a day (QID) | RESPIRATORY_TRACT | Status: DC
  Administered 2019-03-23 (×3): 3 mL via RESPIRATORY_TRACT
  Filled 2019-03-23 (×3): qty 3

## 2019-03-23 NOTE — Care Management Important Message (Signed)
Important Message  Patient Details  Name: Erika Leach MRN: [id number] Date of Birth: 10/09/1950   Medicare Important Message Given:  Yes     Erika Leach 03/23/2019, 3:39 PM

## 2019-03-23 NOTE — Progress Notes (Signed)
Inpatient Diabetes Program Recommendations  AACE/ADA: New Consensus Statement on Inpatient Glycemic Control   Target Ranges:  Prepandial:   less than 140 mg/dL      Peak postprandial:   less than 180 mg/dL (1-2 hours)      Critically ill patients:  140 - 180 mg/dL   Results for PERSEPHANY, BATZER (MRN [id number]) as of 03/23/2019 14:00  Ref. Range 03/22/2019 07:25 03/22/2019 12:10 03/22/2019 16:43 03/22/2019 20:41 03/23/2019 07:24 03/23/2019 11:48  Glucose-Capillary Latest Ref Range: 70 - 99 mg/dL 226 (H) 280 (H) 281 (H) 206 (H) 216 (H) 272 (H)   Review of Glycemic Control  Current orders for Inpatient glycemic control: Novolog 0-20 units TID with meals, Novolog 0-5 units QHS, Novolog 4 units TID with meals, Glipizide 5 mg QAM; Solumedrol 40 mg Q6H  Inpatient Diabetes Program Recommendations:   Insulin-Meal Coverage: If steroids are continued, please consider increasing meal coverage to Novolog 6 units TID with meals.  Thanks, Barnie Alderman, RN, MSN, CDE Diabetes Coordinator Inpatient Diabetes Program [phone] (Team Pager from 8am to 5pm)

## 2019-03-23 NOTE — Progress Notes (Signed)
PROGRESS NOTE    Erika Leach  [id number] DOB: 12/25/1950 DOA: 03/20/2019 PCP: Antonietta Jewel, MD   Brief Narrative:  68 year old lady with prior history of COPD, diastolic heart failure, asthma, history of complete heart block status post pacemaker, hypertension presents to ED with shortness of breath and wheezing for 3 days without any relief.  She was admitted for evaluation and management of acute exacerbation of COPD.  Patient seen and examined today continues to have audible wheezing and tachypneic and still requires about 1 L of nasal cannula oxygen to keep sats greater than 90%.   Assessment & Plan:   Principal Problem:   COPD exacerbation ([place]) Active Problems:   Essential hypertension   Mild pulmonary hypertension (HCC)   Pacemaker-[employer]es   OSA (obstructive sleep apnea)   Morbid obesity (HCC)   Acute respiratory failure (HCC)   Acute COPD exacerbation/acute respiratory failure with hypoxia Improving but patient is not back to baseline yet she is still very tachypneic, and requiring up to 1 L of nasal cannula oxygen to keep sats greater than 90% Continue with IV Solu-Medrol 40 mg every 6 hours and duo nebs and Dulera. Continue with IV Rocephin for acute bronchitis and COPD exacerbation Nasal cannula oxygen to keep sats greater than 90%,  Repeat ambulating oxygen levels prior to discharge.    Chronic diastolic heart failure Echocardiogram from [id number] shows Systolic function was normal.   The estimated ejection fraction was in the range of 60% to 65%.   Doppler parameters are consistent with abnormal left ventricular   relaxation (grade 1 diastolic dysfunction). - Pulmonary arteries: Systolic pressure was moderately increased.   PA peak pressure: 42 mm Hg (S). She appears to be compensated at this time.  No indication of diuretics.  History of heart block s/p PPM Continue with atenolol.   Type 2 diabetes mellitus CBG (last 3)  Recent Labs    03/22/19 1643  03/22/19 2041 03/23/19 0724  GLUCAP 281* 206* 216*   Uncontrolled probably secondary to IV steroids.  Patient is on resistant SSI and glipizide. Increase NovoLog to 4 units 3 times daily AC for better control A1c is 7.8  Essential hypertension Well-controlled blood pressure parameters Continue with Norvasc   Morbid obesity Recommend outpatient follow-up with PCP for diet control and weight loss.   Mild pulmonary hypertension Outpatient follow-up with pulmonology.  DVT prophylaxis: Lovenox Code Status: Full code Family Communication: None at bedside  disposition Plan: Pending clinical improvement .   Consultants:   None  Procedures: None Antimicrobials: Rocephin  Subjective: Patient still very tachypneic and is requiring up to 1 L of nasal cannula oxygen to keep sats greater than 90%.  She reports she is not back to baseline yet. She denies any chest pain or palpitations, nausea vomiting or diarrhea.  Objective: Vitals:   03/22/19 1947 03/23/19 0006 03/23/19 0727 03/23/19 0900  BP:  137/64 (!) 144/75   Pulse:  71 64 61  Resp:    18  Temp:  97.9 F (36.6 C) 98 F (36.7 C)   TempSrc:  Oral Oral   SpO2: 93% 94% 95% 94%  Weight:      Height:        Intake/Output Summary (Last 24 hours) at 03/23/2019 0956 Last data filed at 03/23/2019 0504 Gross per 24 hour  Intake --  Output 675 ml  Net -675 ml   Filed Weights   03/20/19 2100 03/21/19 1500  Weight: 88.3 kg 88.3 kg    Examination:  General exam: Alert and in mild distress from tachypnea Respiratory system: Bilateral expiratory wheezing heard posteriorly, tachypnea, air entry fair Cardiovascular system: S1-S2 heard, regular rate rhythm, no pedal edema. Gastrointestinal system: Abdomen is soft, nontender, nondistended, bowel sounds are heard. Central nervous system: Alert and oriented, no focal neurological deficits.. Extremities: No pedal edema cyanosis or clubbing. Skin: No rashes seen Psychiatry: .   Mood is appropriate    Data Reviewed: I have personally reviewed following labs and imaging studies  CBC: Recent Labs  Lab 03/20/19 1609 03/21/19 0123  WBC 9.3 7.8  NEUTROABS 3.6  --   HGB 14.8 13.2  HCT 46.1* 41.1  MCV 87.0 86.0  PLT 265 [id number]   Basic Metabolic Panel: Recent Labs  Lab 03/20/19 1609 03/21/19 0123 03/23/19 0841  NA 142 140 142  K 3.8 4.0 3.5  CL 101 103 101  CO2 30 23 27   GLUCOSE 96 305* 254*  BUN 15 20 22   CREATININE 0.85 0.96 0.77  CALCIUM 10.4* 9.6 9.4   GFR: Estimated Creatinine Clearance: 73.8 mL/min (by C-G formula based on SCr of 0.77 mg/dL). Liver Function Tests: Recent Labs  Lab 03/20/19 1609  AST 20  ALT 19  ALKPHOS 81  BILITOT 0.6  PROT 8.2*  ALBUMIN 4.9   No results for input(s): LIPASE, AMYLASE in the last 168 hours. No results for input(s): AMMONIA in the last 168 hours. Coagulation Profile: No results for input(s): INR, PROTIME in the last 168 hours. Cardiac Enzymes: No results for input(s): CKTOTAL, CKMB, CKMBINDEX, TROPONINI in the last 168 hours. BNP (last 3 results) No results for input(s): PROBNP in the last 8760 hours. HbA1C: Recent Labs    03/21/19 0123  HGBA1C 7.8*   CBG: Recent Labs  Lab 03/22/19 0725 03/22/19 1210 03/22/19 1643 03/22/19 2041 03/23/19 0724  GLUCAP 226* 280* 281* 206* 216*   Lipid Profile: No results for input(s): CHOL, HDL, LDLCALC, TRIG, CHOLHDL, LDLDIRECT in the last 72 hours. Thyroid Function Tests: No results for input(s): TSH, T4TOTAL, FREET4, T3FREE, THYROIDAB in the last 72 hours. Anemia Panel: No results for input(s): VITAMINB12, FOLATE, FERRITIN, TIBC, IRON, RETICCTPCT in the last 72 hours. Sepsis Labs: No results for input(s): PROCALCITON, LATICACIDVEN in the last 168 hours.  Recent Results (from the past 240 hour(s))  SARS Coronavirus 2 [facility] order, Performed in [facility] hospital lab) Nasopharyngeal Nasopharyngeal Swab     Status: None   Collection Time: 03/20/19   3:37 PM   Specimen: Nasopharyngeal Swab  Result Value Ref Range Status   SARS Coronavirus 2 NEGATIVE NEGATIVE Final    Comment: (NOTE) If result is NEGATIVE SARS-CoV-2 target nucleic acids are NOT DETECTED. The SARS-CoV-2 RNA is generally detectable in upper and lower  respiratory specimens during the acute phase of infection. The lowest  concentration of SARS-CoV-2 viral copies this assay can detect is 250  copies / mL. A negative result does not preclude SARS-CoV-2 infection  and should not be used as the sole basis for treatment or other  patient management decisions.  A negative result may occur with  improper specimen collection / handling, submission of specimen other  than nasopharyngeal swab, presence of viral mutation(s) within the  areas targeted by this assay, and inadequate number of viral copies  (<250 copies / mL). A negative result must be combined with clinical  observations, patient history, and epidemiological information. If result is POSITIVE SARS-CoV-2 target nucleic acids are DETECTED. The SARS-CoV-2 RNA is generally detectable in upper and lower  respiratory specimens dur ing the acute phase of infection.  Positive  results are indicative of active infection with SARS-CoV-2.  Clinical  correlation with patient history and other diagnostic information is  necessary to determine patient infection status.  Positive results do  not rule out bacterial infection or co-infection with other viruses. If result is PRESUMPTIVE POSTIVE SARS-CoV-2 nucleic acids MAY BE PRESENT.   A presumptive positive result was obtained on the submitted specimen  and confirmed on repeat testing.  While 2019 novel coronavirus  (SARS-CoV-2) nucleic acids may be present in the submitted sample  additional confirmatory testing may be necessary for epidemiological  and / or clinical management purposes  to differentiate between  SARS-CoV-2 and other Sarbecovirus currently known to infect  humans.  If clinically indicated additional testing with an alternate test  methodology [phone]) is advised. The SARS-CoV-2 RNA is generally  detectable in upper and lower respiratory sp ecimens during the acute  phase of infection. The expected result is Negative. Fact Sheet for Patients:  StrictlyIdeas.no Fact Sheet for Healthcare Providers: BankingDealers.co.za This test is not yet approved or cleared by the [place] FDA and has been authorized for detection and/or diagnosis of SARS-CoV-2 by FDA under an Emergency Use Authorization (EUA).  This EUA will remain in effect (meaning this test can be used) for the duration of the COVID-19 declaration under Section 564(b)(1) of the Act, 21 U.S.C. section 360bbb-3(b)(1), unless the authorization is terminated or revoked sooner. Performed at [facility], [place] [address] [place], [place] [address]          Radiology Studies: No results found.      Scheduled Meds:  amLODipine  10 mg Oral Daily   atenolol  50 mg Oral BID   cloNIDine  0.1 mg Oral BID   enoxaparin (LOVENOX) injection  40 mg Subcutaneous Q24H   gabapentin  300 mg Oral TID   glipiZIDE  5 mg Oral Q breakfast   guaiFENesin  600 mg Oral BID   insulin aspart  0-20 Units Subcutaneous TID WC   insulin aspart  0-5 Units Subcutaneous QHS   insulin aspart  3 Units Subcutaneous TID WC   ipratropium-albuterol  3 mL Nebulization Q6H   methylPREDNISolone (SOLU-MEDROL) injection  40 mg Intravenous Q6H   mometasone-formoterol  2 puff Inhalation BID   pravastatin  20 mg Oral q1800   Continuous Infusions:  cefTRIAXone (ROCEPHIN)  IV 1 g (03/22/19 2110)     LOS: 2 days    Time spent:30 minutes.     Hosie Poisson, MD [facility] Pager [phone]   If 7PM-7AM, please contact night-coverage [email or web address] Password [facility] 03/23/2019, 9:56 AM

## 2019-03-23 NOTE — TOC Initial Note (Addendum)
Transition of Care [facility]) - Initial/Assessment Note    Patient Details  Name: Erika Leach MRN: [id number] Date of Birth: 1951/06/30  Transition of Care [facility]) CM/SW Contact:    Maryclare Labrador, RN Phone Number: 03/23/2019, 2:47 PM  Clinical Narrative:  Pt is independent from home.  Pt confirmed she has a PCP and denied barriers with paying for medications.  Pt has COPD and a HHRN will help with disease management.   Pt is interested in  [facility] as ordered - Pt chose [facility] per medicare.gov HH list - agency contacted and referral accepted.  Pt chose [employer] for home oxygen - agency contacted and referral accepted.    Expected Discharge Plan: [place] Barriers to Discharge: Continued Medical Work up   Patient Goals and CMS Choice   CMS Medicare.gov Compare Post Acute Care list provided to:: Patient Choice offered to / list presented to : Patient  Expected Discharge Plan and Services Expected Discharge Plan: [place]       Living arrangements for the past 2 months: Single Family Home                 DME Arranged: Oxygen DME Agency: [facility] Date DME Agency Contacted: 03/23/19 Time DME Agency Contacted: [id number] Representative spoke with at DME Agency: [place] Arranged: RN, PT [place] Agency: [place] ([place]) Date [place]: 03/23/19 Time [place]: 1445 Representative spoke with at [place]: [place] Arrangements/Services Living arrangements for the past 2 months: [place] with:: Self Patient language and need for interpreter reviewed:: Yes        Need for Family Participation in Patient Care: No (Comment) Care giver support system in place?: Yes (comment)   Criminal Activity/Legal Involvement Pertinent to Current Situation/Hospitalization: No - Comment as needed  Activities of Daily Living Home Assistive Devices/Equipment: None ADL Screening (condition at time of  admission) Patient's cognitive ability adequate to safely complete daily activities?: Yes Is the patient deaf or have difficulty hearing?: No Does the patient have difficulty seeing, even when wearing glasses/contacts?: No Does the patient have difficulty concentrating, remembering, or making decisions?: No Patient able to express need for assistance with ADLs?: Yes Does the patient have difficulty dressing or bathing?: No Independently performs ADLs?: Yes (appropriate for developmental age) Does the patient have difficulty walking or climbing stairs?: No Weakness of Legs: None Weakness of Arms/Hands: None  Permission Sought/Granted                  Emotional Assessment   Attitude/Demeanor/Rapport: Gracious, Self-Confident, Engaged Affect (typically observed): Accepting, Adaptable Orientation: : Oriented to Self, Oriented to Place, Oriented to  Time, Oriented to Situation      Admission diagnosis:  sob Patient Active Problem List   Diagnosis Date Noted  . Hypoxemia   . Acute respiratory failure ([place]) 03/21/2019  . COPD exacerbation ([place]) 03/20/2019  . Morbid obesity ([place]) 11/29/2015  . COPD GOLD II  11/01/2015  . OSA (obstructive sleep apnea) 12/04/2014  . Dyspnea on exertion 07/09/2013  . 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  . Essential hypertension 06/28/2012  . Mild pulmonary hypertension ([place]) 06/28/2012  . Asthma 06/28/2012  . Complete heart block ([place]) 06/24/2012   PCP:  Antonietta Jewel, MD Pharmacy:   [id number] - [address], [place] [address] Phone: [phone] Fax: [phone]     Social Determinants of Health (SDOH) Interventions  Readmission Risk Interventions No flowsheet data found.

## 2019-03-23 NOTE — Progress Notes (Signed)
SATURATION QUALIFICATIONS: (This note is used to comply with regulatory documentation for home oxygen)  Patient Saturations on Room Air at Rest = 87%  Patient Saturations on Room Air while Ambulating = 84%-87%  Patient Saturations on 1 Liters of oxygen while Ambulating = 93-98%%  Please briefly explain why patient needs home oxygen: COPD

## 2019-03-23 NOTE — Plan of Care (Signed)
  Problem: Clinical Measurements: Goal: Ability to maintain clinical measurements within normal limits will improve Outcome: Progressing Goal: Will remain free from infection Outcome: Progressing Goal: Diagnostic test results will improve Outcome: Progressing Goal: Respiratory complications will improve Outcome: Progressing Goal: Cardiovascular complication will be avoided Outcome: Progressing   Problem: Health Behavior/Discharge Planning: Goal: Ability to manage health-related needs will improve Outcome: Progressing   Problem: Education: Goal: Knowledge of General Education information will improve Description: Including pain rating scale, medication(s)/side effects and non-pharmacologic comfort measures Outcome: Progressing   Problem: Coping: Goal: Level of anxiety will decrease Outcome: Progressing   Problem: Elimination: Goal: Will not experience complications related to bowel motility Outcome: Progressing Goal: Will not experience complications related to urinary retention Outcome: Progressing   Problem: Pain Managment: Goal: General experience of comfort will improve Outcome: Progressing

## 2019-03-23 NOTE — Progress Notes (Signed)
Physical Therapy Treatment Patient Details Name: Erika Leach MRN: [id number] DOB: [birth year] Today's Date: 03/23/2019    History of Present Illness Pt adm with copd exacerbation. PMH - copd, asthma, diastolic dysfunction CHF, complete heart block, HTN, pacer    PT Comments    Patient progressing with ambulation without device, but still desaturating while ambulating on O2.  Able to recover with O2 @ 2LPM and cues for pursed lip breathing.  Also reports CM stated not able to set up HHPT with her insurance, so issued and demonstrated LE standing strengthening exercises for home.  Feel she will benefit from continued acute skilled PT until d/c.    Follow Up Recommendations  Supervision - Intermittent;No PT follow up     Equipment Recommendations  None recommended by PT    Recommendations for Other Services       Precautions / Restrictions Precautions Precautions: Fall    Mobility  Bed Mobility Overal bed mobility: Modified Independent                Transfers Overall transfer level: Modified independent                  Ambulation/Gait Ambulation/Gait assistance: Supervision Gait Distance (Feet): 150 Feet Assistive device: None Gait Pattern/deviations: Step-through pattern;Decreased stride length     General Gait Details: Ambulated on 1-2L O2 with SpO2 86%, up to 92% with standing about 1 minute cues for pursed lip breathing   Stairs             Wheelchair Mobility    Modified Rankin (Stroke Patients Only)       Balance     Sitting balance-Leahy Scale: Good       Standing balance-Leahy Scale: Good                              Cognition Arousal/Alertness: Awake/alert Behavior During Therapy: WFL for tasks assessed/performed Overall Cognitive Status: Within Functional Limits for tasks assessed                                        Exercises Other Exercises Other Exercises: Demonstrated sit<>stand  without UE support, standing at counter heel raises, marching and hip abduction; all issued for HEP with handout.    General Comments General comments (skin integrity, edema, etc.): Encouraged hallway ambulation as tolerated with nursing assist      Pertinent Vitals/Pain Pain Assessment: No/denies pain    Home Living                      Prior Function            PT Goals (current goals can now be found in the care plan section) Progress towards PT goals: Progressing toward goals    Frequency    Min 3X/week      PT Plan Current plan remains appropriate    Co-evaluation              AM-PAC PT "6 Clicks" Mobility   Outcome Measure  Help needed turning from your back to your side while in a flat bed without using bedrails?: None Help needed moving from lying on your back to sitting on the side of a flat bed without using bedrails?: None Help needed moving to and from a bed to a chair (including a wheelchair)?:  None Help needed standing up from a chair using your arms (e.g., wheelchair or bedside chair)?: None Help needed to walk in hospital room?: None Help needed climbing 3-5 steps with a railing? : A Little 6 Click Score: 23    End of Session Equipment Utilized During Treatment: Oxygen Activity Tolerance: Patient tolerated treatment well Patient left: in chair;with call bell/phone within reach   PT Visit Diagnosis: Difficulty in walking, not elsewhere classified (R26.2)     Time: 1413-1430 PT Time Calculation (min) (ACUTE ONLY): 17 min  Charges:  $Gait Training: 8-22 mins                     Magda Kiel, [place] [phone] 03/23/2019    Reginia Naas 03/23/2019, 4:52 PM

## 2019-03-23 NOTE — Progress Notes (Signed)
Daily Nursing Note  Received report from [place], [place]. Introduced self to patient who is in no distress at the time of assessment. Though patient was hopeful to discharge today she was noted to desaturate on RA therefore physician team opted to keep another day. DME O2 ordered by team. Blood sugars have been elevated in the setting of steroids and intake a a sugary sweet. Patient has had a rather uneventful day, remains hemodynamically stable. All patient needs met throughout the course of the day.

## 2019-03-24 DIAGNOSIS — G4733 Obstructive sleep apnea (adult) (pediatric): Secondary | ICD-10-CM

## 2019-03-24 LAB — CBC
HCT: 40.1 % (ref 36.0–46.0)
Hemoglobin: 12.9 g/dL (ref 12.0–15.0)
MCH: 27.6 pg (ref 26.0–34.0)
MCHC: 32.2 g/dL (ref 30.0–36.0)
MCV: 85.7 fL (ref 80.0–100.0)
Platelets: 247 10*3/uL (ref 150–400)
RBC: 4.68 MIL/uL (ref 3.87–5.11)
RDW: 13.6 % (ref 11.5–15.5)
WBC: 10.3 10*3/uL (ref 4.0–10.5)
nRBC: 0 % (ref 0.0–0.2)

## 2019-03-24 LAB — GLUCOSE, CAPILLARY
Glucose-Capillary: 237 mg/dL — ABNORMAL HIGH (ref 70–99)
Glucose-Capillary: 196 mg/dL — ABNORMAL HIGH (ref 70–99)

## 2019-03-24 MED ORDER — PREDNISONE 20 MG PO TABS: 20.0000 mg | ORAL_TABLET | Freq: Every day | ORAL | Status: DC

## 2019-03-24 MED ORDER — SPIRIVA HANDIHALER 18 MCG IN CAPS: 18.0000 ug | ORAL_CAPSULE | Freq: Every day | RESPIRATORY_TRACT | 0 refills | Status: DC

## 2019-03-24 MED ORDER — INFLUENZA VAC A&B SA ADJ QUAD 0.5 ML IM PRSY: 0.5000 mL | PREFILLED_SYRINGE | INTRAMUSCULAR | Status: DC

## 2019-03-24 NOTE — Progress Notes (Signed)
Chaplain visited patient and gave patient information on advanced directives.  As patient is soon to be discharged the patient stated they will complete the information and bring it in if they should have another stay.  Chaplain assesses no need to follow up.  Brion Aliment Chaplain Resident For questions concerning this note please contact me by pager [phone]

## 2019-03-24 NOTE — Discharge Summary (Addendum)
Physician Discharge Summary  Erika Leach [id number] DOB: [birth year] DOA: 03/20/2019  PCP: Antonietta Jewel, MD  Admit date: 03/20/2019 Discharge date: 03/24/2019  Admitted From: Home Disposition:  Home   Recommendations for Outpatient Follow-up and new medication changes:  1. Follow up with Dr. Sheryle Hail in 7 days.  2. Patient received 5 days of systemic steroids. 3. Continue bronchodilator at home with beta 2 agonist and inhaled corticosteroid.  4. Will add long acting anticholinergic with tiotropium.  5. Patient likely with chronic hypoxic respiratory failure, oxygen desaturation down to 85% on ambulation.   Home Health: no   Equipment/Devices: Home 02   Discharge Condition: stable  CODE STATUS: full  Diet recommendation: heart healthy and diabetic prudent.   Brief/Interim Summary: 68 year old female who presented with dyspnea.  She does have significant past medical history for COPD/asthma, diastolic heart failure, complete heart block status post pacemaker and hypertension.  Patient reported 3 days of persistent dyspnea and wheezing.  Symptoms were refractive to outpatient therapy with bronchodilators.  On her initial physical examination her temperature was 98.5, blood pressure 145/73, heart rate 66, respiratory rate 30, oxygen saturation 97%.  She had decreased air entry bilaterally with marked expiratory wheezing, heart S1-S2 present and rhythmic, the abdomen soft no lower extremity edema. Sodium 142, potassium 3.8, chloride 101, bicarb 30, glucose 96, BUN 15, creatinine 0.95, white count 9.3, hemoglobin 14.8, hematocrit 46.1, platelets 265.  SARS COVID-19 was negative.  Her chest radiograph had hyperinflation with increased lung markings bilaterally.  EKG 60 bpm, atrial and ventricular paced rhythm, no significant ST segment or T wave changes.  Patient was admitted to the hospital with a working diagnosis of COPD exacerbation.  1.  Acute COPD exacerbation with acute on chronic  hypoxic respiratory failure/pulmonary hypertension.  Patient was admitted to the medical ward, she received systemic steroids, aggressive bronchodilators and supplemental oxygen per nasal cannula.  Her symptoms improved, her oxygen saturation is 96% on 1 L per nasal cannula, no significant wheezing or dyspnea.  Patient will be discharged home to continue bronchodilator (albuterol/ symbicort).  Received 5 doses of systemic steroids while hospitalized.  2.  Diastolic heart failure/status post pacer.  He remained stable with no signs of exacerbation.  3.  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  Patient received insulin sliding scale along with pre-meal insulin for glucose control.  Her hemoglobin A1c 7.8 consistent with uncontrolled diabetes mellitus. Continue glipizide and metformin.   4.  Hypertension.  Continue blood pressure control with amlodipine, clonidine and  Atenolol.   5.  Morbid obesity with dyslipidemia..  Her calculated BMI is 32.3, she will need outpatient follow-up. Continue lovastatin.    Discharge Diagnoses:  Principal Problem:   COPD exacerbation (Erika Leach) Active Problems:   Essential hypertension   Mild pulmonary hypertension (HCC)   Pacemaker-[employer]   OSA (obstructive sleep apnea)   Morbid obesity (Erika Leach)   Acute respiratory failure (HCC)   Hypoxemia    Discharge Instructions   Allergies as of 03/24/2019   No Known Allergies     Medication List    STOP taking these medications   amoxicillin 500 MG capsule Commonly known as: AMOXIL     TAKE these medications   amLODipine 10 MG tablet Commonly known as: NORVASC Take 10 mg by mouth daily.   atenolol 50 MG tablet Commonly known as: TENORMIN Take 50 mg by mouth 2 (two) times daily.   cloNIDine 0.1 MG tablet Commonly known as: CATAPRES Take 0.1 mg by mouth 2 (  two) times daily.   gabapentin 300 MG capsule Commonly known as: NEURONTIN Take 1 capsule by mouth 3 (three) times daily.    glipiZIDE 5 MG tablet Commonly known as: GLUCOTROL Take 5 mg by mouth daily before breakfast.   lovastatin 20 MG tablet Commonly known as: MEVACOR Take 20 mg by mouth at bedtime.   metFORMIN 500 MG tablet Commonly known as: GLUCOPHAGE Take 500 mg by mouth 2 (two) times daily.   albuterol (2.5 MG/3ML) 0.083% nebulizer solution Commonly known as: PROVENTIL Take 2.5 mg by nebulization every 6 (six) hours as needed for wheezing or shortness of breath.   ProAir HFA 108 (90 Base) MCG/ACT inhaler Generic drug: albuterol Inhale 2 puffs into the lungs every 6 (six) hours as needed for wheezing or shortness of breath.   Spiriva HandiHaler 18 MCG inhalation capsule Generic drug: tiotropium Place 1 capsule (18 mcg total) into inhaler and inhale daily.   Symbicort 160-4.5 MCG/ACT inhaler Generic drug: budesonide-formoterol Inhale 2 puffs into the lungs 2 (two) times daily.   traMADol 50 MG tablet Commonly known as: ULTRAM Take 50-100 mg by mouth daily as needed for pain.            Durable Medical Equipment  (From admission, onward)         Start     Ordered   03/23/19 0957  For home use only DME oxygen  Once    Question Answer Comment  Length of Need Lifetime   Mode or (Route) Nasal cannula   Liters per Minute 1   Frequency Continuous (stationary and portable oxygen unit needed)   Oxygen conserving device Yes   Oxygen delivery system Gas      03/23/19 0956          No Known Allergies  Consultations:     Procedures/Studies: Dg Chest 2 View  Result Date: 03/20/2019 CLINICAL DATA:  Shortness of breath with chest tightness 2 weeks. EXAM: CHEST - 2 VIEW COMPARISON:  11/01/2015 FINDINGS: Left-sided pacemaker unchanged. Lungs are adequately inflated without airspace consolidation or effusion. Cardiomediastinal silhouette and remainder of the exam is unchanged. IMPRESSION: No active cardiopulmonary disease. Electronically Signed   By: Marin Olp M.D.   On:  03/20/2019 13:27      Procedures:   Subjective: Patient is feeling better, her dyspnea is back to baseline, no nausea or vomiting, no chest pain or cough.   Discharge Exam: Vitals:   03/24/19 0713 03/24/19 0847  BP: 138/72   Pulse: 61 64  Resp:  16  Temp: 98.3 F (36.8 C)   SpO2: 95% 96%   Vitals:   03/23/19 1944 03/23/19 2356 03/24/19 0713 03/24/19 0847  BP:  116/75 138/72   Pulse:  61 61 64  Resp:    16  Temp:  (!) 97.5 F (36.4 C) 98.3 F (36.8 C)   TempSrc:  Oral Oral   SpO2: 94% 97% 95% 96%  Weight:      Height:        General: Not in pain or dyspnea.  Neurology: Awake and alert, non focal  E ENT: no pallor, no icterus, oral mucosa moist Cardiovascular: No JVD. S1-S2 present, rhythmic, no gallops, rubs, or murmurs. No lower extremity edema. Pulmonary: positive breath sounds bilaterally, mild decreased air movement, with no wheezing, rhonchi or rales. Gastrointestinal. Abdomen with no organomegaly, non tender, no rebound or guarding Skin. No rashes Musculoskeletal: no joint deformities   The results of significant diagnostics from this hospitalization (including imaging, microbiology,  ancillary and laboratory) are listed below for reference.     Microbiology: Recent Results (from the past 240 hour(s))  SARS Coronavirus 2 [facility] order, Performed in [facility] hospital lab) Nasopharyngeal Nasopharyngeal Swab     Status: None   Collection Time: 03/20/19  3:37 PM   Specimen: Nasopharyngeal Swab  Result Value Ref Range Status   SARS Coronavirus 2 NEGATIVE NEGATIVE Final    Comment: (NOTE) If result is NEGATIVE SARS-CoV-2 target nucleic acids are NOT DETECTED. The SARS-CoV-2 RNA is generally detectable in upper and lower  respiratory specimens during the acute phase of infection. The lowest  concentration of SARS-CoV-2 viral copies this assay can detect is 250  copies / mL. A negative result does not preclude SARS-CoV-2 infection  and should not be used  as the sole basis for treatment or other  patient management decisions.  A negative result may occur with  improper specimen collection / handling, submission of specimen other  than nasopharyngeal swab, presence of viral mutation(s) within the  areas targeted by this assay, and inadequate number of viral copies  (<250 copies / mL). A negative result must be combined with clinical  observations, patient history, and epidemiological information. If result is POSITIVE SARS-CoV-2 target nucleic acids are DETECTED. The SARS-CoV-2 RNA is generally detectable in upper and lower  respiratory specimens dur ing the acute phase of infection.  Positive  results are indicative of active infection with SARS-CoV-2.  Clinical  correlation with patient history and other diagnostic information is  necessary to determine patient infection status.  Positive results do  not rule out bacterial infection or co-infection with other viruses. If result is PRESUMPTIVE POSTIVE SARS-CoV-2 nucleic acids MAY BE PRESENT.   A presumptive positive result was obtained on the submitted specimen  and confirmed on repeat testing.  While 2019 novel coronavirus  (SARS-CoV-2) nucleic acids may be present in the submitted sample  additional confirmatory testing may be necessary for epidemiological  and / or clinical management purposes  to differentiate between  SARS-CoV-2 and other Sarbecovirus currently known to infect humans.  If clinically indicated additional testing with an alternate test  methodology [phone]) is advised. The SARS-CoV-2 RNA is generally  detectable in upper and lower respiratory sp ecimens during the acute  phase of infection. The expected result is Negative. Fact Sheet for Patients:  StrictlyIdeas.no Fact Sheet for Healthcare Providers: BankingDealers.co.za This test is not yet approved or cleared by the [place] FDA and has been authorized for  detection and/or diagnosis of SARS-CoV-2 by FDA under an Emergency Use Authorization (EUA).  This EUA will remain in effect (meaning this test can be used) for the duration of the COVID-19 declaration under Section 564(b)(1) of the Act, 21 U.S.C. section 360bbb-3(b)(1), unless the authorization is terminated or revoked sooner. Performed at [facility], [place] [address], [place], [place] [address]      Labs: BNP (last 3 results) Recent Labs    03/20/19 1609  BNP [id number]   Basic Metabolic Panel: Recent Labs  Lab 03/20/19 1609 03/21/19 0123 03/23/19 0841  NA 142 140 142  K 3.8 4.0 3.5  CL 101 103 101  CO2 30 23 27   GLUCOSE 96 305* 254*  BUN 15 20 22   CREATININE 0.85 0.96 0.77  CALCIUM 10.4* 9.6 9.4   Liver Function Tests: Recent Labs  Lab 03/20/19 1609  AST 20  ALT 19  ALKPHOS 81  BILITOT 0.6  PROT 8.2*  ALBUMIN 4.9   No  results for input(s): LIPASE, AMYLASE in the last 168 hours. No results for input(s): AMMONIA in the last 168 hours. CBC: Recent Labs  Lab 03/20/19 1609 03/21/19 0123 03/24/19 0434  WBC 9.3 7.8 10.3  NEUTROABS 3.6  --   --   HGB 14.8 13.2 12.9  HCT 46.1* 41.1 40.1  MCV 87.0 86.0 85.7  PLT 265 250 247   Cardiac Enzymes: No results for input(s): CKTOTAL, CKMB, CKMBINDEX, TROPONINI in the last 168 hours. BNP: Invalid input(s): POCBNP CBG: Recent Labs  Lab 03/23/19 0724 03/23/19 1148 03/23/19 1604 03/23/19 2124 03/24/19 0714  GLUCAP 216* 272* 323* 316* 237*   D-Dimer No results for input(s): DDIMER in the last 72 hours. Hgb A1c No results for input(s): HGBA1C in the last 72 hours. Lipid Profile No results for input(s): CHOL, HDL, LDLCALC, TRIG, CHOLHDL, LDLDIRECT in the last 72 hours. Thyroid function studies No results for input(s): TSH, T4TOTAL, T3FREE, THYROIDAB in the last 72 hours.  Invalid input(s): FREET3 Anemia work up No results for input(s): VITAMINB12, FOLATE, FERRITIN, TIBC, IRON, RETICCTPCT in the last 72  hours. Urinalysis No results found for: COLORURINE, APPEARANCEUR, LABSPEC, [place], GLUCOSEU, [place], BILIRUBINUR, KETONESUR, PROTEINUR, UROBILINOGEN, NITRITE, LEUKOCYTESUR Sepsis Labs Invalid input(s): PROCALCITONIN,  WBC,  [place] Microbiology Recent Results (from the past 240 hour(s))  SARS Coronavirus 2 [facility] order, Performed in [facility] hospital lab) Nasopharyngeal Nasopharyngeal Swab     Status: None   Collection Time: 03/20/19  3:37 PM   Specimen: Nasopharyngeal Swab  Result Value Ref Range Status   SARS Coronavirus 2 NEGATIVE NEGATIVE Final    Comment: (NOTE) If result is NEGATIVE SARS-CoV-2 target nucleic acids are NOT DETECTED. The SARS-CoV-2 RNA is generally detectable in upper and lower  respiratory specimens during the acute phase of infection. The lowest  concentration of SARS-CoV-2 viral copies this assay can detect is 250  copies / mL. A negative result does not preclude SARS-CoV-2 infection  and should not be used as the sole basis for treatment or other  patient management decisions.  A negative result may occur with  improper specimen collection / handling, submission of specimen other  than nasopharyngeal swab, presence of viral mutation(s) within the  areas targeted by this assay, and inadequate number of viral copies  (<250 copies / mL). A negative result must be combined with clinical  observations, patient history, and epidemiological information. If result is POSITIVE SARS-CoV-2 target nucleic acids are DETECTED. The SARS-CoV-2 RNA is generally detectable in upper and lower  respiratory specimens dur ing the acute phase of infection.  Positive  results are indicative of active infection with SARS-CoV-2.  Clinical  correlation with patient history and other diagnostic information is  necessary to determine patient infection status.  Positive results do  not rule out bacterial infection or co-infection with other viruses. If result is PRESUMPTIVE  POSTIVE SARS-CoV-2 nucleic acids MAY BE PRESENT.   A presumptive positive result was obtained on the submitted specimen  and confirmed on repeat testing.  While 2019 novel coronavirus  (SARS-CoV-2) nucleic acids may be present in the submitted sample  additional confirmatory testing may be necessary for epidemiological  and / or clinical management purposes  to differentiate between  SARS-CoV-2 and other Sarbecovirus currently known to infect humans.  If clinically indicated additional testing with an alternate test  methodology [phone]) is advised. The SARS-CoV-2 RNA is generally  detectable in upper and lower respiratory sp ecimens during the acute  phase of infection. The expected result is  Negative. Fact Sheet for Patients:  StrictlyIdeas.no Fact Sheet for Healthcare Providers: BankingDealers.co.za This test is not yet approved or cleared by the [place] FDA and has been authorized for detection and/or diagnosis of SARS-CoV-2 by FDA under an Emergency Use Authorization (EUA).  This EUA will remain in effect (meaning this test can be used) for the duration of the COVID-19 declaration under Section 564(b)(1) of the Act, 21 U.S.C. section 360bbb-3(b)(1), unless the authorization is terminated or revoked sooner. Performed at [facility], [place] [address], [place], [place] [address]      Time coordinating discharge: 45 minutes  SIGNED:   Tawni Millers, MD  [facility] 03/24/2019, 11:49 AM

## 2019-03-24 NOTE — Progress Notes (Signed)
Patient transferred to discharge unit with belongings.

## 2019-03-24 NOTE — Care Management (Signed)
Therapy worked with pt late evening yesterday and are no longer recommending [facility] - pt is in agreement.  CM reached out to [facility] to infom pt will only need [facility]

## 2019-03-24 NOTE — Progress Notes (Signed)
Patient given discharge instructions and verbalizes understanding of instructions. Patient transported via wheelchair to meet family with home 02.

## 2019-03-24 NOTE — Progress Notes (Signed)
SATURATION QUALIFICATIONS: (This note is used to comply with regulatory documentation for home oxygen)  Patient Saturations on Room Air at Rest = 95%  Patient Saturations on Room Air while Ambulating = 85%  Patient Saturations on 1 Liters of oxygen while Ambulating = 92%  Please briefly explain why patient needs home oxygen: desaturation to 85% when ambulating.

## 2019-03-31 ENCOUNTER — Encounter: Payer: Self-pay | Admitting: Cardiology

## 2019-03-31 ENCOUNTER — Telehealth: Payer: Self-pay | Admitting: Internal Medicine

## 2019-03-31 NOTE — Progress Notes (Signed)
Remote pacemaker transmission.   

## 2019-04-01 ENCOUNTER — Encounter: Payer: Self-pay | Admitting: Internal Medicine

## 2019-04-01 ENCOUNTER — Ambulatory Visit (INDEPENDENT_AMBULATORY_CARE_PROVIDER_SITE_OTHER): Payer: Medicare Other | Admitting: Internal Medicine

## 2019-04-01 ENCOUNTER — Other Ambulatory Visit: Payer: Self-pay

## 2019-04-01 VITALS — BP 104/60 | HR 60 | Temp 98.0°F | Ht 65.0 in | Wt 196.6 lb

## 2019-04-01 DIAGNOSIS — J449 Chronic obstructive pulmonary disease, unspecified: Secondary | ICD-10-CM

## 2019-04-01 DIAGNOSIS — J9611 Chronic respiratory failure with hypoxia: Secondary | ICD-10-CM | POA: Diagnosis not present

## 2019-04-01 DIAGNOSIS — I10 Essential (primary) hypertension: Secondary | ICD-10-CM

## 2019-04-01 MED ORDER — BISOPROLOL FUMARATE 5 MG PO TABS: 5.0000 mg | ORAL_TABLET | Freq: Every day | ORAL | 11 refills | Status: DC

## 2019-04-01 MED ORDER — SPIRIVA RESPIMAT 2.5 MCG/ACT IN AERS: 2.0000 | INHALATION_SPRAY | Freq: Every day | RESPIRATORY_TRACT | 11 refills | Status: DC

## 2019-04-01 MED ORDER — SPIRIVA RESPIMAT 2.5 MCG/ACT IN AERS: 2.0000 | INHALATION_SPRAY | Freq: Every day | RESPIRATORY_TRACT | 0 refills | Status: DC

## 2019-04-01 MED ORDER — PREDNISONE 10 MG PO TABS: ORAL_TABLET | ORAL | 0 refills | Status: DC

## 2019-04-01 NOTE — Patient Instructions (Addendum)
Plan A = Automatic = Always=    symbicort 160 x 2 puffs and then spiriva x 2 puffs then symbicort 160 x 2 puffs x 12 hour later  Work on inhaler technique:  relax and gently blow all the way out then take a nice smooth deep breath back in, triggering the inhaler at same time you start breathing in.  Hold for up to 5 seconds if you can. Blow out thru nose. Rinse and gargle with water when done   Plan B = Backup (to supplement plan A, not to replace it) Only use your albuterol inhaler as a rescue medication to be used if you can't catch your breath by resting or doing a relaxed purse lip breathing pattern.  - The less you use it, the better it will work when you need it. - Ok to use the inhaler up to 2 puffs  every 4 hours if you must but call for appointment if use goes up over your usual need - Don't leave home without it !!  (think of it like the spare tire for your car)   Plan C = Crisis (instead of Plan B but only if Plan B stops working) - only use your albuterol nebulizer if you first try Plan B and it fails to help > ok to use the nebulizer up to every 4 hours but if start needing it regularly call for immediate appointment   Plan D = Deltasone  = if not getting better > Prednisone 10 mg take  4 each am x 2 days,   2 each am x 2 days,  1 each am x 2 days and stop   Stop atenolol and start bisoprolol 5 mg one daily on 04/02/19   Adjust 02 to keep sats > 90%   Please schedule a follow up office visit in 4 weeks, sooner if needed

## 2019-04-01 NOTE — Progress Notes (Signed)
Subjective:    Patient ID: Erika Leach, female    DOB: 12-16-1950, 68 y.o.   MRN: [id number]  HPI  59 yobf  Quit smoking 2014 @ wt 89 with h/o childhood asthma outgrew by [facility] with onset breathing problems during pregnancies but no maint rx then worse 2013 /  and on inhalers ever since so referred to pulmonary clinic 11/01/2015 by Dr Erika Leach with dx of GOLD III copd 10/2014 .   11/01/2015 1st [facility] office visit/    Chief Complaint  Patient presents with  . Pulmonary Consult    Referred by Dr. Antonietta Leach. Pt c/o SOB x 4 yrs- "all the time" with or without any exertion. She also c/o prod cough with white sputum.    usually no cough first thing then as stir around starts cough/  some coughing also  at bedtime but doesn't keep her up, cough drops work the best/ so severe she gags >  proair helps for a few hours then bad again, no better on qvar so dc'd. Can lose breath sitting with / without cough / gag On Lisinopril since quit smoking and pattern has persisted daily since then even on pred which "just makes her fat, doesn't really help sob or cough" rec Stop lisiopril and start valsartan 160/12.5 one daily and your cough should gradually resolve over several weeks  Only use your albuterol as a rescue medication  GERD    Please schedule a follow up office visit in 4 weeks, sooner if needed to address any and all of  the rest of your respiratory symptoms that don't resolve off lisinopril (it's the only way to sort this out)    11/29/2015  f/u ov/ re: acei cough gone/  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 Follow-up    Breathing is unchanged. Her cough is much better. She is using albuterol 1-2 x daily on average.   MMRC2 = can't walk a nl pace on a flat grade s sob but does fine slow and flat eg [employer]  rec Plan A = Automatic = Bevespi Take 2 puffs first thing in am and then another 2 puffs about 12 hours later.  Plan B = Backup  Only use your albuterol (proair) as a rescue medication     Admit date: 03/20/2019 Discharge date: 03/24/2019  Admitted From: Home Disposition:  Home   Recommendations for Outpatient Follow-up and new medication changes:  1. Follow up with Dr. Sheryle Leach in 7 days.  2. Patient received 5 days of systemic steroids. 3. Continue bronchodilator at home with beta 2 agonist and inhaled corticosteroid.  4. Will add long acting anticholinergic with tiotropium.  5. Patient likely with chronic hypoxic respiratory failure, oxygen desaturation down to 85% on ambulation.   Home Health: no   Equipment/Devices: Home 02   Discharge Condition: stable  CODE STATUS: full  Diet recommendation: heart healthy and diabetic prudent.   Brief/Interim Summary: 68 year old female who presented with dyspnea. She does have significant past medical history for COPD/asthma, diastolic heart failure, complete heart block status post pacemaker and hypertension. Patient reported 3 days of persistent dyspnea and wheezing. Symptoms were refractive to outpatient therapy with bronchodilators. On her initial physical examination her temperature was 98.5, blood pressure 145/73, heart rate 66, respiratory rate 30, oxygen saturation 97%. She had decreased air entry bilaterally with marked expiratory wheezing, heart S1-S2 present and rhythmic, the abdomen soft no lower extremity edema. Sodium 142, potassium  3.8, chloride 101, bicarb 30, glucose 96, BUN 15, creatinine 0.95, white count 9.3, hemoglobin 14.8, hematocrit 46.1, platelets 265. SARS COVID-19 was negative. Her chest radiograph had hyperinflation with increased lung markings bilaterally. EKG 60 bpm, atrial and ventricular paced rhythm, no significant ST segment or T wave changes.  Patient was admitted to the hospital with a working diagnosis of COPD exacerbation.  1.  Acute COPD exacerbation with acute on chronic hypoxic respiratory failure/pulmonary  hypertension.  Patient was admitted to the medical ward, she received systemic steroids, aggressive bronchodilators and supplemental oxygen per nasal cannula.  Her symptoms improved, her oxygen saturation is 96% on 1 L per nasal cannula, no significant wheezing or dyspnea.  Patient will be discharged home to continue bronchodilator (albuterol/ symbicort).  Received 5 doses of systemic steroids while hospitalized.  2.  Diastolic heart failure/status post pacer.    remained stable with no signs of exacerbation.  3.  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  Patient received insulin sliding scale along with pre-meal insulin for glucose control.  Her hemoglobin A1c 7.8 consistent with uncontrolled diabetes mellitus. Continue glipizide and metformin.   4.  Hypertension.  Continue blood pressure control with amlodipine, clonidine and  Atenolol.   5.  Morbid obesity with dyslipidemia..  Her calculated BMI is 32.3, she will need outpatient follow-up. Continue lovastatin.    Discharge Diagnoses:  Principal Problem:   COPD exacerbation ([place]) Active Problems:   Essential hypertension   Mild pulmonary hypertension ([facility])   Pacemaker-[employer]   OSA (obstructive sleep apnea)   Morbid obesity ([place])   Acute respiratory failure ([facility])   Hypoxemia    Discharge Instructions   Allergies as of 03/24/2019   No Known Allergies              Medication List        STOP taking these medications       amoxicillin 500 MG capsule Commonly known as: AMOXIL             TAKE these medications       amLODipine 10 MG tablet Commonly known as: NORVASC Take 10 mg by mouth daily.   atenolol 50 MG tablet Commonly known as: TENORMIN Take 50 mg by mouth 2 (two) times daily.   cloNIDine 0.1 MG tablet Commonly known as: CATAPRES Take 0.1 mg by mouth 2 (two) times daily.   gabapentin 300 MG capsule Commonly known as: NEURONTIN Take 1 capsule by mouth 3 (three)  times daily.   glipiZIDE 5 MG tablet Commonly known as: GLUCOTROL Take 5 mg by mouth daily before breakfast.   lovastatin 20 MG tablet Commonly known as: MEVACOR Take 20 mg by mouth at bedtime.   metFORMIN 500 MG tablet Commonly known as: GLUCOPHAGE Take 500 mg by mouth 2 (two) times daily.   albuterol (2.5 MG/3ML) 0.083% nebulizer solution Commonly known as: PROVENTIL Take 2.5 mg by nebulization every 6 (six) hours as needed for wheezing or shortness of breath.   ProAir HFA 108 (90 Base) MCG/ACT inhaler Generic drug: albuterol Inhale 2 puffs into the lungs every 6 (six) hours as needed for wheezing or shortness of breath.   Spiriva HandiHaler 18 MCG inhalation capsule Generic drug: tiotropium Place 1 capsule (18 mcg total) into inhaler and inhale daily.   Symbicort 160-4.5 MCG/ACT inhaler Generic drug: budesonide-formoterol Inhale 2 puffs into the lungs 2 (two) times daily.   traMADol 50 MG tablet Commonly known as: ULTRAM Take 50-100 mg by mouth daily as  needed for pain.       04/01/2019   re: re-establish  GOLD II copd criteria, now 02 dep Cc  Worse sob  While on symbicort for a year or more and did not contact this office Post hosp with aecopd  Chief Complaint  Patient presents with  . Pulmonary Consult    Pt here to re-establish care for COPD. She was seen here last 11/29/2015.  She was [facility] to [facility] recently for COPD exacerbation 03-20-19-03-24-19.  She states her breathing is doing well today as long as she is using her o2. She is using her proair and neb both 4 x per day.   Dyspnea:  Very sedentary since d/c  Cough: better since started on 02  Sleeping: on side bed is flat. One pillow  SABA use: way over using  02: 1 lpm 24/7    No obvious day to day or daytime variability or assoc excess/ purulent sputum or mucus plugs or hemoptysis or cp or chest tightness, subjective wheeze or overt sinus or hb symptoms.   Sleeping as above without  nocturnal  or early am exacerbation  of respiratory  c/o's or need for noct saba. Also denies any obvious fluctuation of symptoms with weather or environmental changes or other aggravating or alleviating factors except as outlined above   No unusual exposure hx or h/o childhood pna/ asthma or knowledge of premature birth.  Current Allergies, Complete Past Medical History, Past Surgical History, Family History, and Social History were reviewed in [employer] record.  ROS  The following are not active complaints unless bolded Hoarseness, sore throat, dysphagia, dental problems, itching, sneezing,  nasal congestion or discharge of excess mucus or purulent secretions, ear ache,   fever, chills, sweats, unintended wt loss or wt gain, classically pleuritic or exertional cp,  orthopnea pnd or arm/hand swelling  or leg swelling, presyncope, palpitations, abdominal pain, anorexia, nausea, vomiting, diarrhea  or change in bowel habits or change in bladder habits, change in stools or change in urine, dysuria, hematuria,  rash, arthralgias, visual complaints, headache, numbness, weakness or ataxia or problems with walking or coordination,  change in mood or  memory.        Current Meds  Medication Sig  . albuterol (PROAIR HFA) 108 (90 BASE) MCG/ACT inhaler Inhale 2 puffs into the lungs every 6 (six) hours as needed for wheezing or shortness of breath.   Marland Kitchen albuterol (PROVENTIL) (2.5 MG/3ML) 0.083% nebulizer solution Take 2.5 mg by nebulization every 6 (six) hours as needed for wheezing or shortness of breath.   Marland Kitchen amLODipine (NORVASC) 10 MG tablet Take 10 mg by mouth daily.  Marland Kitchen atenolol (TENORMIN) 50 MG tablet Take 50 mg by mouth 2 (two) times daily.  . cloNIDine (CATAPRES) 0.1 MG tablet Take 0.1 mg by mouth 2 (two) times daily.  Marland Kitchen gabapentin (NEURONTIN) 300 MG capsule Take 1 capsule by mouth 3 (three) times daily.  Marland Kitchen glipiZIDE (GLUCOTROL) 5 MG tablet Take 5 mg by mouth daily before  breakfast.  . lovastatin (MEVACOR) 20 MG tablet Take 20 mg by mouth at bedtime.  . metFORMIN (GLUCOPHAGE) 500 MG tablet Take 500 mg by mouth 2 (two) times daily.  . SYMBICORT 160-4.5 MCG/ACT inhaler Inhale 2 puffs into the lungs 2 (two) times daily.   Marland Kitchen tiotropium (SPIRIVA HANDIHALER) 18 MCG inhalation capsule Place 1 capsule (18 mcg total) into inhaler and inhale daily.  . traMADol (ULTRAM) 50 MG tablet Take 50-100 mg by mouth daily as  needed for pain.              .       Objective:   Physical Exam  W/c bound mod obese bf nad    04/01/2019       196   11/29/2015       200   11/01/15 200 lb 12.8 oz (91.082 kg)  02/14/15 197 lb (89.359 kg)  11/23/14 193 lb 9.6 oz (87.816 kg)    Vital signs reviewed - Note on arrival 02 sats  95% on 1 lpm cont 02   HEENT : pt wearing mask not removed for exam due to covid - 19 concerns.    NECK :  without JVD/Nodes/TM/ nl carotid upstrokes bilaterally   LUNGS: no acc muscle use,  Mild barrel  contour chest wall with bilateral  Distant bs s audible wheeze and  without cough on insp or exp maneuvers  and mild  Hyperresonant  to  percussion bilaterally     CV:  RRR  no s3 or murmur or increase in P2, and no edema   ABD:  Obese soft and nontender with pos end  insp Hoover's  in the supine position. No bruits or organomegaly appreciated, bowel sounds nl  MS:   Slow but steady gait/  ext warm without deformities, calf tenderness, cyanosis or clubbing No obvious joint restrictions   SKIN: warm and dry without lesions    NEURO:  alert, approp, nl sensorium with  no motor or cerebellar deficits apparent.           I personally reviewed images and agree with radiology impression as follows:  CXR:   03/20/19 No active cardiopulmonary disease.  Lab Results  Component Value Date   WBC 10.3 03/24/2019   HGB 12.9 03/24/2019   HCT 40.1 03/24/2019   MCV 85.7 03/24/2019   PLT 247 03/24/2019        EOS                                                              1.5                                      03/24/2019       Assessment & Plan:

## 2019-04-01 NOTE — Telephone Encounter (Signed)
Error

## 2019-04-02 ENCOUNTER — Encounter: Payer: Self-pay | Admitting: Internal Medicine

## 2019-04-02 DIAGNOSIS — J9611 Chronic respiratory failure with hypoxia: Secondary | ICD-10-CM | POA: Insufficient documentation

## 2019-04-02 NOTE — Assessment & Plan Note (Signed)
Trial off acei 11/01/2015 due to cough> resolved 11/29/2015  - changed high dose tenormin to bisoprolol 5 mg daily 04/01/2019   In the setting of respiratory symptoms of unknown etiology,  It would be preferable to use bystolic, the most beta -1  selective Beta blocker available in sample form, with bisoprolol the most selective generic choice  on the market, at least on a trial basis, to make sure the spillover Beta 2 effects of the less specific Beta blockers are not contributing to this patient's symptoms.   >>>>   Try bisoprolol 5 mg daily

## 2019-04-02 NOTE — Assessment & Plan Note (Addendum)
Quit smoking 2014 PFT's 10/19/14   FEV1 1.24 (57 % ) ratio 53  p 9 % improvement from saba p ?  prior to study with DLCO  38 % corrects to 52 % for alv volume   - 11/29/2015    try bevespi  - 03/20/2019 admit with aecopd with Eos 1.5  - 04/01/2019  After extensive coaching inhaler device,  effectiveness =    75% with smi so changed spiriva to respimat/ continue symb 160 2bid    Clearly pt now  Group D in terms of symptom/risk and laba/lama/ICS  therefore appropriate rx at this point >>>  Symb/spiriva at least until new triple available  Advised:  formulary restrictions will be an ongoing challenge for the forseable future and I would be happy to pick an alternative if the pt will first  provide me a list of them -  pt  will need to return here for training for any new device that is required eg dpi vs hfa vs respimat.    In the meantime we can always provide samples so that the patient never runs out of any needed respiratory medications.   If all else fails, given prednisone x 6 days as Plan D see avs for instructions unique to this ov esp in view of such impressive Eos at admit which may req additional w/u if symptoms not well controlled off systemic steroids.

## 2019-04-02 NOTE — Assessment & Plan Note (Addendum)
New start on outpt 02 03/20/2019 p d/c from admit  04/01/2019 Patient Saturations on Room Air at Rest = 96% >>> Room Air while Ambulating = 88% and on  2 Liters of pulsed oxygen while Ambulating = 93%  No need for 02 at rest but 1lpm hs and 2lpm POC with ex or titrate to keep > 90%    Total time devoted to counseling  > 50 % of initial 60 min office visit:  reviewed case with pt/partner Marya Amsler  directly observed portions of ambulatory 02 saturation study/  performed device teaching  using a teach back technique which also  extended face to face time for this visit (see above)  discussion of options/alternatives/ personally creating written customized instructions  in presence of pt  then going over those specific  Instructions directly with the pt including how to use all of the meds but in particular covering each new medication in detail and the difference between the maintenance= "automatic" meds and the prns using an action plan format for the latter (If this problem/symptom => do that organization reading Left to right).  Please see AVS from this visit for a full list of these instructions which I personally wrote for this pt and  are unique to this visit.

## 2019-04-19 ENCOUNTER — Telehealth: Payer: Self-pay | Admitting: Internal Medicine

## 2019-04-19 MED ORDER — SPIRIVA RESPIMAT 2.5 MCG/ACT IN AERS: 2.0000 | INHALATION_SPRAY | Freq: Every day | RESPIRATORY_TRACT | 11 refills | Status: DC

## 2019-04-19 NOTE — Telephone Encounter (Signed)
Spoke with the pt's spouse and notified that spiriva was refilled   Nothing further needed

## 2019-05-03 ENCOUNTER — Encounter: Payer: Self-pay | Admitting: Internal Medicine

## 2019-05-03 ENCOUNTER — Other Ambulatory Visit: Payer: Self-pay

## 2019-05-03 ENCOUNTER — Ambulatory Visit (INDEPENDENT_AMBULATORY_CARE_PROVIDER_SITE_OTHER): Payer: Medicare Other | Admitting: Internal Medicine

## 2019-05-03 DIAGNOSIS — J449 Chronic obstructive pulmonary disease, unspecified: Secondary | ICD-10-CM

## 2019-05-03 DIAGNOSIS — J9611 Chronic respiratory failure with hypoxia: Secondary | ICD-10-CM

## 2019-05-03 DIAGNOSIS — B370 Candidal stomatitis: Secondary | ICD-10-CM | POA: Diagnosis not present

## 2019-05-03 DIAGNOSIS — I10 Essential (primary) hypertension: Secondary | ICD-10-CM

## 2019-05-03 MED ORDER — FLUCONAZOLE 100 MG PO TABS: 100.0000 mg | ORAL_TABLET | Freq: Every day | ORAL | 0 refills | Status: DC

## 2019-05-03 MED ORDER — BUDESONIDE-FORMOTEROL FUMARATE 80-4.5 MCG/ACT IN AERO: INHALATION_SPRAY | RESPIRATORY_TRACT | 12 refills | Status: DC

## 2019-05-03 NOTE — Patient Instructions (Addendum)
Decrease the symbicort to 80 Take 2 puffs first thing in am and then another 2 puffs about 12 hours later.     Work on inhaler technique:  relax and gently blow all the way out then take a nice smooth deep breath back in, triggering the inhaler at same time you start breathing in.  Hold for up to 5 seconds if you can. Blow out thru nose. Rinse and gargle with water when done and brush teeth  Diflucan 100mg  daily x 3 days and stop mevacor x 3 days   Goal is to keep 02 sats above 90% at all time - continue to use 2lpm at bedtime when you can't be monitoring your 02 levels  Please schedule a follow up office visit in 6 weeks, call sooner if needed

## 2019-05-03 NOTE — Progress Notes (Signed)
Subjective:   Patient ID: Erika Leach, female    DOB: [birth year], 68 y.o.   MRN: [id number]    Brief patient profile:  63 yobf  Quit smoking 2014 @ wt 165 with h/o childhood asthma outgrew by [facility] with onset breathing problems during pregnancies but no maint rx then worse 2013 /  and on inhalers ever since so referred to pulmonary clinic 11/01/2015 by Dr Sheryle Hail with dx of GOLD III copd 10/2014 .  History of Present Illness  11/01/2015 1st [facility] office visit/ Erika Leach   Chief Complaint  Patient presents with  . Pulmonary Consult    Referred by Dr. Antonietta Jewel. Pt c/o SOB x 4 yrs- "all the time" with or without any exertion. She also c/o prod cough with white sputum.    usually no cough first thing then as stir around starts cough/  some coughing also  at bedtime but doesn't keep her up, cough drops work the best/ so severe she gags >  proair helps for a few hours then bad again, no better on qvar so dc'd. Can lose breath sitting with / without cough / gag On Lisinopril since quit smoking and pattern has persisted daily since then even on pred which "just makes her fat, doesn't really help sob or cough" rec Stop lisiopril and start valsartan 160/12.5 one daily and your cough should gradually resolve over several weeks  Only use your albuterol as a rescue medication  GERD    Please schedule a follow up office visit in 4 weeks, sooner if needed to address any and all of  the rest of your respiratory symptoms that don't resolve off lisinopril (it's the only way to sort this out)    11/29/2015  f/u ov/Erika Leach re: acei cough gone/  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 Follow-up    Breathing is unchanged. Her cough is much better. She is using albuterol 1-2 x daily on average.   MMRC2 = can't walk a nl pace on a flat grade s sob but does fine slow and flat eg [employer]  rec Plan A = Automatic = Bevespi Take 2 puffs first thing in am and then another 2  puffs about 12 hours later.  Plan B = Backup Only use your albuterol (proair) as a rescue medication     Admit date: 03/20/2019 Discharge date: 03/24/2019  Admitted From: Home Disposition:  Home   Recommendations for Outpatient Follow-up and new medication changes:  1. Follow up with Dr. Sheryle Hail in 7 days.  2. Patient received 5 days of systemic steroids. 3. Continue bronchodilator at home with beta 2 agonist and inhaled corticosteroid.  4. Will add long acting anticholinergic with tiotropium.  5. Patient likely with chronic hypoxic respiratory failure, oxygen desaturation down to 85% on ambulation.   Home Health: no   Equipment/Devices: Home 02   Discharge Condition: stable  CODE STATUS: full  Diet recommendation: heart healthy and diabetic prudent.   Brief/Interim Summary: 68 year old female who presented with dyspnea. She does have significant past medical history for COPD/asthma, diastolic heart failure, complete heart block status post pacemaker and hypertension. Patient reported 3 days of persistent dyspnea and wheezing. Symptoms were refractive to outpatient therapy with bronchodilators. On her initial physical examination her temperature was 98.5, blood pressure 145/73, heart rate 66, respiratory rate 30, oxygen saturation 97%. She had decreased air entry bilaterally with marked expiratory wheezing, heart S1-S2 present and rhythmic, the abdomen soft  no lower extremity edema. Sodium 142, potassium 3.8, chloride 101, bicarb 30, glucose 96, BUN 15, creatinine 0.95, white count 9.3, hemoglobin 14.8, hematocrit 46.1, platelets 265. SARS COVID-19 was negative. Her chest radiograph had hyperinflation with increased lung markings bilaterally. EKG 60 bpm, atrial and ventricular paced rhythm, no significant ST segment or T wave changes.  Patient was admitted to the hospital with a working diagnosis of COPD exacerbation.  1.  Acute COPD exacerbation with acute on chronic  hypoxic respiratory failure/pulmonary hypertension.  Patient was admitted to the medical ward, she received systemic steroids, aggressive bronchodilators and supplemental oxygen per nasal cannula.  Her symptoms improved, her oxygen saturation is 96% on 1 L per nasal cannula, no significant wheezing or dyspnea.  Patient will be discharged home to continue bronchodilator (albuterol/ symbicort).  Received 5 doses of systemic steroids while hospitalized.  2.  Diastolic heart failure/status post pacer.    remained stable with no signs of exacerbation.  3.  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  Patient received insulin sliding scale along with pre-meal insulin for glucose control.  Her hemoglobin A1c 7.8 consistent with uncontrolled diabetes mellitus. Continue glipizide and metformin.   4.  Hypertension.  Continue blood pressure control with amlodipine, clonidine and  Atenolol.   5.  Morbid obesity with dyslipidemia..  Her calculated BMI is 32.3, she will need outpatient follow-up. Continue lovastatin.    Discharge Diagnoses:  Principal Problem:   COPD exacerbation ([place]) Active Problems:   Essential hypertension   Mild pulmonary hypertension ([facility])   Pacemaker-[employer]   OSA (obstructive sleep apnea)   Morbid obesity ([place])   Acute respiratory failure ([facility])   Hypoxemia    Discharge Instructions   Allergies as of 03/24/2019   No Known Allergies              Medication List        STOP taking these medications       amoxicillin 500 MG capsule Commonly known as: AMOXIL             TAKE these medications       amLODipine 10 MG tablet Commonly known as: NORVASC Take 10 mg by mouth daily.   atenolol 50 MG tablet Commonly known as: TENORMIN Take 50 mg by mouth 2 (two) times daily.   cloNIDine 0.1 MG tablet Commonly known as: CATAPRES Take 0.1 mg by mouth 2 (two) times daily.   gabapentin 300 MG capsule Commonly known as: NEURONTIN  Take 1 capsule by mouth 3 (three) times daily.   glipiZIDE 5 MG tablet Commonly known as: GLUCOTROL Take 5 mg by mouth daily before breakfast.   lovastatin 20 MG tablet Commonly known as: MEVACOR Take 20 mg by mouth at bedtime.   metFORMIN 500 MG tablet Commonly known as: GLUCOPHAGE Take 500 mg by mouth 2 (two) times daily.   albuterol (2.5 MG/3ML) 0.083% nebulizer solution Commonly known as: PROVENTIL Take 2.5 mg by nebulization every 6 (six) hours as needed for wheezing or shortness of breath.   ProAir HFA 108 (90 Base) MCG/ACT inhaler Generic drug: albuterol Inhale 2 puffs into the lungs every 6 (six) hours as needed for wheezing or shortness of breath.   Spiriva HandiHaler 18 MCG inhalation capsule Generic drug: tiotropium Place 1 capsule (18 mcg total) into inhaler and inhale daily.   Symbicort 160-4.5 MCG/ACT inhaler Generic drug: budesonide-formoterol Inhale 2 puffs into the lungs 2 (two) times daily.   traMADol 50 MG tablet Commonly known as: Veatrice Bourbon  Take 50-100 mg by mouth daily as needed for pain.       04/01/2019  Erika Leach re: re-establish  GOLD II copd criteria, now 02 dep Cc  Worse sob  While on symbicort for a year or more and did not contact this office Post hosp with aecopd  Chief Complaint  Patient presents with  . Pulmonary Consult    Pt here to re-establish care for COPD. She was seen here last 11/29/2015.  She was admiitted to [facility] recently for COPD exacerbation 03-20-19-03-24-19.  She states her breathing is doing well today as long as she is using her o2. She is using her proair and neb both 4 x per day.   Dyspnea:  Very sedentary since d/c  Cough: better since started on 02  Sleeping: on side bed is flat. One pillow  SABA use: way over using  02: 1 lpm 24/7  rec Plan A = Automatic = Always=    symbicort 160 x 2 puffs and then spiriva x 2 puffs then symbicort 160 x 2 puffs x 12 hour later Work on inhaler technique:  Plan B = Backup (to  supplement plan A, not to replace it) Only use your albuterol inhaler as a rescue medication Plan C = Crisis (instead of Plan B but only if Plan B stops working) - only use your albuterol nebulizer if you first try Plan B Plan D = Deltasone  = if not getting better > Prednisone 10 mg take  4 each am x 2 days,   2 each am x 2 days,  1 each am x 2 days and stop  Stop atenolol and start bisoprolol 5 mg one daily on 04/02/19  Adjust 02 to keep sats > 90%    05/03/2019  f/u ov/Erika Leach re:  Gold II Spirometry  but 02 dep after most recent hosp d/c now maint on symb/spiriva did not need pred but still developed thrush  Chief Complaint  Patient presents with  . Follow-up    Breathing is doing well. She has had cough with clear to cream colored sputum. She is using her proair about 2 x per wk and she has not used neb.    Dyspnea:  Up the street and back s 02 and sats stayed above 90% but not doing this often Cough: none  Sleeping: on side one pillow/ bed flat  SABA use: rarely 02: hs 2lpm and has portable    No obvious day to day or daytime variability or assoc excess/ purulent sputum or mucus plugs or hemoptysis or cp or chest tightness, subjective wheeze or overt sinus or hb symptoms.   Sleeping as above  without nocturnal  or early am exacerbation  of respiratory  c/o's or need for noct saba. Also denies any obvious fluctuation of symptoms with weather or environmental changes or other aggravating or alleviating factors except as outlined above   No unusual exposure hx or h/o childhood pna/ asthma or knowledge of premature birth.  Current Allergies, Complete Past Medical History, Past Surgical History, Family History, and Social History were reviewed in [employer] record.  ROS  The following are not active complaints unless bolded Hoarseness, sore throat, dysphagia, dental problems, itching, sneezing,  nasal congestion or discharge of excess mucus or purulent secretions,  ear ache,   fever, chills, sweats, unintended wt loss or wt gain, classically pleuritic or exertional cp,  orthopnea pnd or arm/hand swelling  or leg swelling, presyncope, palpitations, abdominal pain, anorexia, nausea,  vomiting, diarrhea  or change in bowel habits or change in bladder habits, change in stools or change in urine, dysuria, hematuria,  rash, arthralgias, visual complaints, headache, numbness, weakness or ataxia or problems with walking or coordination,  change in mood or  memory.        Current Meds  Medication Sig  . albuterol (PROAIR HFA) 108 (90 BASE) MCG/ACT inhaler Inhale 2 puffs into the lungs every 6 (six) hours as needed for wheezing or shortness of breath.   Marland Kitchen albuterol (PROVENTIL) (2.5 MG/3ML) 0.083% nebulizer solution Take 2.5 mg by nebulization every 6 (six) hours as needed for wheezing or shortness of breath.   Marland Kitchen amLODipine (NORVASC) 10 MG tablet Take 10 mg by mouth daily.  . bisoprolol (ZEBETA) 5 MG tablet Take 1 tablet (5 mg total) by mouth daily.  . cloNIDine (CATAPRES) 0.1 MG tablet Take 0.1 mg by mouth 2 (two) times daily.  Marland Kitchen gabapentin (NEURONTIN) 300 MG capsule Take 1 capsule by mouth 3 (three) times daily.  Marland Kitchen glipiZIDE (GLUCOTROL) 5 MG tablet Take 5 mg by mouth daily before breakfast.  . lovastatin (MEVACOR) 20 MG tablet Take 20 mg by mouth at bedtime.  . metFORMIN (GLUCOPHAGE) 500 MG tablet Take 500 mg by mouth 2 (two) times daily.  . SYMBICORT 160-4.5 MCG/ACT inhaler Inhale 2 puffs into the lungs 2 (two) times daily.   . Tiotropium Bromide Monohydrate (SPIRIVA RESPIMAT) 2.5 MCG/ACT AERS Inhale 2 puffs into the lungs daily.  . traMADol (ULTRAM) 50 MG tablet Take 50-100 mg by mouth daily as needed for pain.              .       Objective:   Physical Exam  amb obese bf nad   05/03/2019     196  04/01/2019       196   11/29/2015       200   11/01/15 200 lb 12.8 oz (91.082 kg)  02/14/15 197 lb (89.359 kg)  11/23/14 193 lb 9.6 oz (87.816 kg)     Vital signs reviewed - Note on arrival 02 sats  96% on 2lpm pulsed   And bp 118/66     HEENT : pt wearing mask not removed for exam due to covid - 19 concerns.    NECK :  without JVD/Nodes/TM/ nl carotid upstrokes bilaterally   LUNGS: no acc muscle use,  Mild barrel  contour chest wall with bilateral  Distant bs s audible wheeze and  without cough on insp or exp maneuvers  and mild  Hyperresonant  to  percussion bilaterally     CV:  RRR  no s3 or murmur or increase in P2, and no edema   ABD:  Obese soft and nontender with pos end  insp Hoover's  in the supine position. No bruits or organomegaly appreciated, bowel sounds nl  MS:   Nl gait/  ext warm without deformities, calf tenderness, cyanosis or clubbing No obvious joint restrictions   SKIN: warm and dry without lesions    NEURO:  alert, approp, nl sensorium with  no motor or cerebellar deficits apparent.                  Assessment & Plan:

## 2019-05-04 ENCOUNTER — Encounter: Payer: Self-pay | Admitting: Internal Medicine

## 2019-05-04 DIAGNOSIS — B370 Candidal stomatitis: Secondary | ICD-10-CM | POA: Insufficient documentation

## 2019-05-04 NOTE — Assessment & Plan Note (Signed)
New start on outpt 02 03/20/2019 p d/c from admit  04/01/2019 Patient Saturations on Room Air at Rest = 96% >>> Room Air while Ambulating = 88% and on  2 Liters of pulsed oxygen while Ambulating = 93%   Clearly improving and no need for daytime 02 as long as keep sats > 90% including esp with exercise which she needs to keep doing daily   For now leave at 2lpm hs

## 2019-05-04 NOTE — Assessment & Plan Note (Signed)
Trial off acei 11/01/2015 due to cough> resolved 11/29/2015  - changed high dose tenormin to bisoprolol 5 mg daily 04/01/2019   Adequate control on present rx, reviewed in detail with pt > no change in rx needed

## 2019-05-04 NOTE — Assessment & Plan Note (Signed)
05/03/2019  Diflucan 100 mg x 3 days / hold mevacor while on it/ reduced symb to 80    I had an extended discussion with the patient reviewing all relevant studies completed to date and  lasting 15 to 20 minutes of a 25 minute visit    I performed detailed device teaching using a teach back method which extended face to face time for this visit (see above)  Each maintenance medication was reviewed in detail including emphasizing most importantly the difference between maintenance and prns and under what circumstances the prns are to be triggered using an action plan format that is not reflected in the computer generated alphabetically organized AVS which I have not found useful in most complex patients, especially with respiratory illnesses  Please see AVS for specific instructions unique to this visit that I personally wrote and verbalized to the the pt in detail and then reviewed with pt  by my nurse highlighting any  changes in therapy recommended at today's visit to their plan of care.

## 2019-05-04 NOTE — Assessment & Plan Note (Addendum)
Quit smoking 2014PFT's 10/19/14   FEV1 1.24 (57 % ) ratio 53  p 9 % improvement from saba p ?  prior to study with DLCO  38 % corrects to 52 % for alv volume   - 11/29/2015    try bevespi  - 03/20/2019 admit with aecopd with Eos 1.5  - 04/01/2019  After extensive coaching inhaler device,  effectiveness =    75% with smi so changed spiriva to respimat/ continue symb 160 2bid - 04/01/2019 pred as plan d x 6 days only     Group D in terms of symptom/risk and laba/lama/ICS  therefore appropriate rx at this point >>>  Continue symb/spiriva  Reduce dose of symbicort to 80 due to thrush  - The proper method of use, as well as anticipated side effects, of a metered-dose inhaler are discussed and demonstrated to the patient.

## 2019-06-15 ENCOUNTER — Encounter: Payer: Self-pay | Admitting: Internal Medicine

## 2019-06-15 ENCOUNTER — Ambulatory Visit (INDEPENDENT_AMBULATORY_CARE_PROVIDER_SITE_OTHER): Payer: Medicare Other | Admitting: *Deleted

## 2019-06-15 ENCOUNTER — Ambulatory Visit (INDEPENDENT_AMBULATORY_CARE_PROVIDER_SITE_OTHER): Payer: Medicare Other | Admitting: Internal Medicine

## 2019-06-15 ENCOUNTER — Other Ambulatory Visit: Payer: Self-pay

## 2019-06-15 DIAGNOSIS — I442 Atrioventricular block, complete: Secondary | ICD-10-CM | POA: Diagnosis not present

## 2019-06-15 DIAGNOSIS — J9611 Chronic respiratory failure with hypoxia: Secondary | ICD-10-CM

## 2019-06-15 DIAGNOSIS — J449 Chronic obstructive pulmonary disease, unspecified: Secondary | ICD-10-CM

## 2019-06-15 LAB — CUP PACEART REMOTE DEVICE CHECK
Battery Impedance: 694 Ohm
Battery Remaining Longevity: 71 mo
Battery Voltage: 2.78 V
Brady Statistic AP VP Percent: 68 %
Brady Statistic AP VS Percent: 2 %
Brady Statistic AS VP Percent: 12 %
Brady Statistic AS VS Percent: 18 %
Date Time Interrogation Session: 20201208124210
Implantable Lead Implant Date: 20131218
Implantable Lead Implant Date: 20131218
Implantable Lead Location: 753859
Implantable Lead Location: 753860
Implantable Lead Model: 5076
Implantable Lead Model: 5076
Implantable Pulse Generator Implant Date: 20131218
Lead Channel Impedance Value: 481 Ohm
Lead Channel Impedance Value: 532 Ohm
Lead Channel Pacing Threshold Amplitude: 0.625 V
Lead Channel Pacing Threshold Amplitude: 0.75 V
Lead Channel Pacing Threshold Pulse Width: 0.4 ms
Lead Channel Pacing Threshold Pulse Width: 0.4 ms
Lead Channel Setting Pacing Amplitude: 2 V
Lead Channel Setting Pacing Amplitude: 2.5 V
Lead Channel Setting Pacing Pulse Width: 0.4 ms
Lead Channel Setting Sensing Sensitivity: 2 mV

## 2019-06-15 MED ORDER — SPIRIVA RESPIMAT 2.5 MCG/ACT IN AERS: 2.0000 | INHALATION_SPRAY | Freq: Every day | RESPIRATORY_TRACT | 0 refills | Status: DC

## 2019-06-15 NOTE — Patient Instructions (Addendum)
Work on inhaler technique:  relax and gently blow all the way out then take a nice smooth deep breath back in, triggering the inhaler at same time you start breathing in.  Hold for up to 5 seconds if you can. Blow Symptoms out thru nose. Rinse and gargle with water when done.    Please schedule a follow up visit in 3 months but call sooner if needed

## 2019-06-15 NOTE — Progress Notes (Signed)
Subjective:   Patient ID: Erika Leach, female    DOB: [birth year], 68 y.o.   MRN: [id number]    Brief patient profile:  63 yobf  Quit smoking 2014 @ wt 165 with h/o childhood asthma outgrew by [facility] with onset breathing problems during pregnancies but no maint rx then worse 2013 /  and on inhalers ever since so referred to pulmonary clinic 11/01/2015 by Dr Sheryle Hail with dx of GOLD III copd 10/2014 .  History of Present Illness  11/01/2015 1st [facility] office visit/ Erika Leach   Chief Complaint  Patient presents with  . Pulmonary Consult    Referred by Dr. Antonietta Jewel. Pt c/o SOB x 4 yrs- "all the time" with or without any exertion. She also c/o prod cough with white sputum.    usually no cough first thing then as stir around starts cough/  some coughing also  at bedtime but doesn't keep her up, cough drops work the best/ so severe she gags >  proair helps for a few hours then bad again, no better on qvar so dc'd. Can lose breath sitting with / without cough / gag On Lisinopril since quit smoking and pattern has persisted daily since then even on pred which "just makes her fat, doesn't really help sob or cough" rec Stop lisiopril and start valsartan 160/12.5 one daily and your cough should gradually resolve over several weeks  Only use your albuterol as a rescue medication  GERD    Please schedule a follow up office visit in 4 weeks, sooner if needed to address any and all of  the rest of your respiratory symptoms that don't resolve off lisinopril (it's the only way to sort this out)    11/29/2015  f/u ov/Erika Leach re: acei cough gone/  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 Follow-up    Breathing is unchanged. Her cough is much better. She is using albuterol 1-2 x daily on average.   MMRC2 = can't walk a nl pace on a flat grade s sob but does fine slow and flat eg [employer]  rec Plan A = Automatic = Bevespi Take 2 puffs first thing in am and then another 2  puffs about 12 hours later.  Plan B = Backup Only use your albuterol (proair) as a rescue medication     Admit date: 03/20/2019 Discharge date: 03/24/2019  Admitted From: Home Disposition:  Home   Recommendations for Outpatient Follow-up and new medication changes:  1. Follow up with Dr. Sheryle Hail in 7 days.  2. Patient received 5 days of systemic steroids. 3. Continue bronchodilator at home with beta 2 agonist and inhaled corticosteroid.  4. Will add long acting anticholinergic with tiotropium.  5. Patient likely with chronic hypoxic respiratory failure, oxygen desaturation down to 85% on ambulation.   Home Health: no   Equipment/Devices: Home 02   Discharge Condition: stable  CODE STATUS: full  Diet recommendation: heart healthy and diabetic prudent.   Brief/Interim Summary: 68 year old female who presented with dyspnea. She does have significant past medical history for COPD/asthma, diastolic heart failure, complete heart block status post pacemaker and hypertension. Patient reported 3 days of persistent dyspnea and wheezing. Symptoms were refractive to outpatient therapy with bronchodilators. On her initial physical examination her temperature was 98.5, blood pressure 145/73, heart rate 66, respiratory rate 30, oxygen saturation 97%. She had decreased air entry bilaterally with marked expiratory wheezing, heart S1-S2 present and rhythmic, the abdomen soft  no lower extremity edema. Sodium 142, potassium 3.8, chloride 101, bicarb 30, glucose 96, BUN 15, creatinine 0.95, white count 9.3, hemoglobin 14.8, hematocrit 46.1, platelets 265. SARS COVID-19 was negative. Her chest radiograph had hyperinflation with increased lung markings bilaterally. EKG 60 bpm, atrial and ventricular paced rhythm, no significant ST segment or T wave changes.  Patient was admitted to the hospital with a working diagnosis of COPD exacerbation.  1.  Acute COPD exacerbation with acute on chronic  hypoxic respiratory failure/pulmonary hypertension.  Patient was admitted to the medical ward, she received systemic steroids, aggressive bronchodilators and supplemental oxygen per nasal cannula.  Her symptoms improved, her oxygen saturation is 96% on 1 L per nasal cannula, no significant wheezing or dyspnea.  Patient will be discharged home to continue bronchodilator (albuterol/ symbicort).  Received 5 doses of systemic steroids while hospitalized.  2.  Diastolic heart failure/status post pacer.    remained stable with no signs of exacerbation.  3.  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  Patient received insulin sliding scale along with pre-meal insulin for glucose control.  Her hemoglobin A1c 7.8 consistent with uncontrolled diabetes mellitus. Continue glipizide and metformin.   4.  Hypertension.  Continue blood pressure control with amlodipine, clonidine and  Atenolol.   5.  Morbid obesity with dyslipidemia..  Her calculated BMI is 32.3, she will need outpatient follow-up. Continue lovastatin.    Discharge Diagnoses:  Principal Problem:   COPD exacerbation ([place]) Active Problems:   Essential hypertension   Mild pulmonary hypertension ([facility])   Pacemaker-[employer]   OSA (obstructive sleep apnea)   Morbid obesity ([place])   Acute respiratory failure ([facility])   Hypoxemia    Discharge Instructions   Allergies as of 03/24/2019   No Known Allergies              Medication List        STOP taking these medications       amoxicillin 500 MG capsule Commonly known as: AMOXIL             TAKE these medications       amLODipine 10 MG tablet Commonly known as: NORVASC Take 10 mg by mouth daily.   atenolol 50 MG tablet Commonly known as: TENORMIN Take 50 mg by mouth 2 (two) times daily.   cloNIDine 0.1 MG tablet Commonly known as: CATAPRES Take 0.1 mg by mouth 2 (two) times daily.   gabapentin 300 MG capsule Commonly known as: NEURONTIN  Take 1 capsule by mouth 3 (three) times daily.   glipiZIDE 5 MG tablet Commonly known as: GLUCOTROL Take 5 mg by mouth daily before breakfast.   lovastatin 20 MG tablet Commonly known as: MEVACOR Take 20 mg by mouth at bedtime.   metFORMIN 500 MG tablet Commonly known as: GLUCOPHAGE Take 500 mg by mouth 2 (two) times daily.   albuterol (2.5 MG/3ML) 0.083% nebulizer solution Commonly known as: PROVENTIL Take 2.5 mg by nebulization every 6 (six) hours as needed for wheezing or shortness of breath.   ProAir HFA 108 (90 Base) MCG/ACT inhaler Generic drug: albuterol Inhale 2 puffs into the lungs every 6 (six) hours as needed for wheezing or shortness of breath.   Spiriva HandiHaler 18 MCG inhalation capsule Generic drug: tiotropium Place 1 capsule (18 mcg total) into inhaler and inhale daily.   Symbicort 160-4.5 MCG/ACT inhaler Generic drug: budesonide-formoterol Inhale 2 puffs into the lungs 2 (two) times daily.   traMADol 50 MG tablet Commonly known as: Veatrice Bourbon  Take 50-100 mg by mouth daily as needed for pain.       04/01/2019  Erika Leach re: re-establish  GOLD II copd criteria, now 02 dep Cc  Worse sob  While on symbicort for a year or more and did not contact this office Post hosp with aecopd  Chief Complaint  Patient presents with  . Pulmonary Consult    Pt here to re-establish care for COPD. She was seen here last 11/29/2015.  She was admiitted to [facility] recently for COPD exacerbation 03-20-19-03-24-19.  She states her breathing is doing well today as long as she is using her o2. She is using her proair and neb both 4 x per day.   Dyspnea:  Very sedentary since d/c  Cough: better since started on 02  Sleeping: on side bed is flat. One pillow  SABA use: way over using  02: 1 lpm 24/7  rec Plan A = Automatic = Always=    symbicort 160 x 2 puffs and then spiriva x 2 puffs then symbicort 160 x 2 puffs x 12 hour later Work on inhaler technique:  Plan B = Backup (to  supplement plan A, not to replace it) Only use your albuterol inhaler as a rescue medication Plan C = Crisis (instead of Plan B but only if Plan B stops working) - only use your albuterol nebulizer if you first try Plan B Plan D = Deltasone  = if not getting better > Prednisone 10 mg take  4 each am x 2 days,   2 each am x 2 days,  1 each am x 2 days and stop  Stop atenolol and start bisoprolol 5 mg one daily on 04/02/19  Adjust 02 to keep sats > 90%    05/03/2019  f/u ov/Erika Leach re:  Gold II Spirometry  but 02 dep after most recent hosp d/c now maint on symb/spiriva did not need pred but still developed thrush  Chief Complaint  Patient presents with  . Follow-up    Breathing is doing well. She has had cough with clear to cream colored sputum. She is using her proair about 2 x per wk and she has not used neb.    Dyspnea:  Up the street and back s 02 and sats stayed above 90% but not doing this often Cough: none  Sleeping: on side one pillow/ bed flat  SABA use: rarely 02: hs 2lpm and has portable  rec Decrease the symbicort to 80 Take 2 puffs first thing in am and then another 2 puffs about 12 hours later.  Work on inhaler technique:  Diflucan 100mg  daily x 3 days and stop mevacor x 3 days  Goal is to keep 02 sats above 90% at all time - continue to use 2lpm at bedtime when you can't be monitoring your 02 levels   06/15/2019  f/u ov/Erika Leach re: GOLD II/ maint on symb 80/spiriva with limited techinique Chief Complaint  Patient presents with  . Follow-up    Breathing has been worse just since today- she started coughing yesterday- prod with cream color sputum. She is using her albuterol inhaler 2-3 x per day and she has not used her neb.   Dyspnea:  Ok until one day prior to OV  With increaesed cough / congested  Cough: thick creamy esp ina m  Sleeping: fine on side one pillow  SABA use: just started using  one day prior to OV   02: 2lpm hs/ has portable not using  No obvious day to day  or daytime variability or assoc excess/ purulent sputum or mucus plugs or hemoptysis or cp or chest tightness, subjective wheeze or overt sinus or hb symptoms.   Sleeping  without nocturnal  exacerbation  of respiratory  c/o's or need for noct saba. Also denies any obvious fluctuation of symptoms with weather or environmental changes or other aggravating or alleviating factors except as outlined above   No unusual exposure hx or h/o childhood pna/ asthma or knowledge of premature birth.  Current Allergies, Complete Past Medical History, Past Surgical History, Family History, and Social History were reviewed in [employer] record.  ROS  The following are not active complaints unless bolded Hoarseness, sore throat, dysphagia, dental problems, itching, sneezing,  nasal congestion or discharge of excess mucus or purulent secretions, ear ache,   fever, chills, sweats, unintended wt loss or wt gain, classically pleuritic or exertional cp,  orthopnea pnd or arm/hand swelling  or leg swelling, presyncope, palpitations, abdominal pain, anorexia, nausea, vomiting, diarrhea  or change in bowel habits or change in bladder habits, change in stools or change in urine, dysuria, hematuria,  rash, arthralgias, visual complaints, headache, numbness, weakness or ataxia or problems with walking or coordination,  change in mood or  memory.        Current Meds  Medication Sig  . albuterol (PROAIR HFA) 108 (90 BASE) MCG/ACT inhaler Inhale 2 puffs into the lungs every 6 (six) hours as needed for wheezing or shortness of breath.   Marland Kitchen albuterol (PROVENTIL) (2.5 MG/3ML) 0.083% nebulizer solution Take 2.5 mg by nebulization every 6 (six) hours as needed for wheezing or shortness of breath.   Marland Kitchen amLODipine (NORVASC) 10 MG tablet Take 10 mg by mouth daily.  . bisoprolol (ZEBETA) 5 MG tablet Take 1 tablet (5 mg total) by mouth daily.  . budesonide-formoterol (SYMBICORT) 80-4.5 MCG/ACT inhaler Take 2 puffs  first thing in am and then another 2 puffs about 12 hours later.  . cloNIDine (CATAPRES) 0.1 MG tablet Take 0.1 mg by mouth 2 (two) times daily.  . fluconazole (DIFLUCAN) 100 MG tablet Take 1 tablet (100 mg total) by mouth daily.  Marland Kitchen gabapentin (NEURONTIN) 300 MG capsule Take 1 capsule by mouth 3 (three) times daily.  Marland Kitchen glipiZIDE (GLUCOTROL) 5 MG tablet Take 5 mg by mouth daily before breakfast.  . lovastatin (MEVACOR) 20 MG tablet Take 20 mg by mouth at bedtime.  . metFORMIN (GLUCOPHAGE) 500 MG tablet Take 500 mg by mouth 2 (two) times daily.  . predniSONE (DELTASONE) 10 MG tablet Take  4 each am x 2 days,   2 each am x 2 days,  1 each am x 2 days and stop  . Tiotropium Bromide Monohydrate (SPIRIVA RESPIMAT) 2.5 MCG/ACT AERS Inhale 2 puffs into the lungs daily.  . traMADol (ULTRAM) 50 MG tablet Take 50-100 mg by mouth daily as needed for pain.           Objective:   Physical Exam  amb pleasant obese bf nad   06/15/2019       198  05/03/2019     196  04/01/2019       196   11/29/2015       200   11/01/15 200 lb 12.8 oz (91.082 kg)  02/14/15 197 lb (89.359 kg)  11/23/14 193 lb 9.6 oz (87.816 kg)     Vital signs reviewed - Note on arrival 02 sats  91% on RA  HEENT : pt wearing mask not removed for exam due to covid - 19 concerns.   NECK :  without JVD/Nodes/TM/ nl carotid upstrokes bilaterally   LUNGS: no acc muscle use,  Min barrel  contour chest wall with bilateral  slightly mid exp rhonchi s audible wheeze and  without cough on insp or exp maneuvers and min  Hyperresonant  to  percussion bilaterally     CV:  RRR  no s3 or murmur or increase in P2, and no edema   ABD:  soft and nontender with pos end  insp Hoover's  in the supine position. No bruits or organomegaly appreciated, bowel sounds nl  MS:   Nl gait/  ext warm without deformities, calf tenderness, cyanosis or clubbing No obvious joint restrictions   SKIN: warm and dry without lesions    NEURO:  alert, approp, nl  sensorium with  no motor or cerebellar deficits apparent.          Assessment & Plan:

## 2019-06-16 ENCOUNTER — Encounter: Payer: Self-pay | Admitting: Internal Medicine

## 2019-06-16 NOTE — Assessment & Plan Note (Signed)
New start on outpt 02 03/20/2019 p d/c from admit  04/01/2019 Patient Saturations on Room Air at Rest = 96% >>> Room Air while Ambulating = 88% and on  2 Liters of pulsed oxygen while Ambulating = 93%  Reminded re using 02 with ambulation with goal of keeping sats > 90%

## 2019-06-16 NOTE — Assessment & Plan Note (Signed)
Quit smoking 2014  PFT's 10/19/14   FEV1 1.24 (57 % ) ratio 53  p 9 % improvement from saba p ?  prior to study with DLCO  38 % corrects to 52 % for alv volume   - 11/29/2015    try bevespi  - 03/20/2019 admit with aecopd with Eos 1.5  - 04/01/2019    changed spiriva to respimat/ continue symb 160 2bid - 04/01/2019 pred as plan d x 6 days only   - 05/03/19 changed symb to 80 due to thrush  - 06/15/2019  After extensive coaching inhaler device,  effectiveness =    75% (short ti)   Mild flare just using extra saba as rec but baseline hfa not optimal - did great with an empty smi container practicing a smooth deep inspiration and suggested she do this daily "like a golfer taking practice swings" before using her maint rx   Has pred for prn:  The goal with a chronic steroid dependent illness is always arriving at the lowest effective dose that controls the disease/symptoms and not accepting a set "formula" which is based on statistics or guidelines that don't always take into account patient  variability or the natural hx of the dz in every individual patient, which may well vary over time.  For now therefore I recommend the patient maintain  Off pred but have it on hand for 6 day cycles for flares not responding to hfa (Plan B)  and needing nebs (plan C)   ie plan D= deltasone  I had an extended discussion with the patient reviewing all relevant studies completed to date and  lasting 15 to 20 minutes of a 25 minute visit    I performed detailed device teaching using a teach back method which extended face to face time for this visit (see above)  Each maintenance medication was reviewed in detail including emphasizing most importantly the difference between maintenance and prns and under what circumstances the prns are to be triggered using an action plan format that is not reflected in the computer generated alphabetically organized AVS which I have not found useful in most complex patients, especially with  respiratory illnesses  Please see AVS for specific instructions unique to this visit that I personally wrote and verbalized to the the pt in detail and then reviewed with pt  by my nurse highlighting any  changes in therapy recommended at today's visit to their plan of care.

## 2019-07-06 ENCOUNTER — Inpatient Hospital Stay (HOSPITAL_COMMUNITY)
Admission: EM | Admit: 2019-07-06 | Discharge: 2019-07-10 | DRG: 177 | Disposition: A | Discharge status: Home / Self Care | Payer: Medicare Other | Attending: Internal Medicine | Admitting: Internal Medicine

## 2019-07-06 ENCOUNTER — Other Ambulatory Visit: Payer: Self-pay

## 2019-07-06 ENCOUNTER — Ambulatory Visit: Payer: Medicare Other

## 2019-07-06 ENCOUNTER — Encounter (HOSPITAL_COMMUNITY): Payer: Self-pay | Admitting: Emergency Medicine

## 2019-07-06 ENCOUNTER — Emergency Department (HOSPITAL_COMMUNITY): Payer: Medicare Other

## 2019-07-06 DIAGNOSIS — N179 Acute kidney failure, unspecified: Secondary | ICD-10-CM | POA: Diagnosis present

## 2019-07-06 DIAGNOSIS — E860 Dehydration: Secondary | ICD-10-CM | POA: Diagnosis present

## 2019-07-06 DIAGNOSIS — Z20822 Contact with and (suspected) exposure to covid-19: Secondary | ICD-10-CM

## 2019-07-06 DIAGNOSIS — I10 Essential (primary) hypertension: Secondary | ICD-10-CM | POA: Diagnosis present

## 2019-07-06 DIAGNOSIS — Z6832 Body mass index (BMI) 32.0-32.9, adult: Secondary | ICD-10-CM | POA: Diagnosis not present

## 2019-07-06 DIAGNOSIS — J449 Chronic obstructive pulmonary disease, unspecified: Secondary | ICD-10-CM | POA: Diagnosis present

## 2019-07-06 DIAGNOSIS — U071 COVID-19: Secondary | ICD-10-CM | POA: Diagnosis present

## 2019-07-06 DIAGNOSIS — Z7952 Long term (current) use of systemic steroids: Secondary | ICD-10-CM

## 2019-07-06 DIAGNOSIS — T380X5A Adverse effect of glucocorticoids and synthetic analogues, initial encounter: Secondary | ICD-10-CM | POA: Diagnosis present

## 2019-07-06 DIAGNOSIS — E1140 Type 2 diabetes mellitus with diabetic neuropathy, unspecified: Secondary | ICD-10-CM | POA: Diagnosis present

## 2019-07-06 DIAGNOSIS — Z79899 Other long term (current) drug therapy: Secondary | ICD-10-CM | POA: Diagnosis not present

## 2019-07-06 DIAGNOSIS — E1165 Type 2 diabetes mellitus with hyperglycemia: Secondary | ICD-10-CM | POA: Diagnosis present

## 2019-07-06 DIAGNOSIS — Z7951 Long term (current) use of inhaled steroids: Secondary | ICD-10-CM | POA: Diagnosis not present

## 2019-07-06 DIAGNOSIS — E119 Type 2 diabetes mellitus without complications: Secondary | ICD-10-CM | POA: Diagnosis not present

## 2019-07-06 DIAGNOSIS — Z950 Presence of cardiac pacemaker: Secondary | ICD-10-CM

## 2019-07-06 DIAGNOSIS — J9601 Acute respiratory failure with hypoxia: Secondary | ICD-10-CM | POA: Diagnosis present

## 2019-07-06 DIAGNOSIS — E785 Hyperlipidemia, unspecified: Secondary | ICD-10-CM | POA: Diagnosis present

## 2019-07-06 DIAGNOSIS — Z87891 Personal history of nicotine dependence: Secondary | ICD-10-CM | POA: Diagnosis not present

## 2019-07-06 DIAGNOSIS — E876 Hypokalemia: Secondary | ICD-10-CM | POA: Diagnosis present

## 2019-07-06 DIAGNOSIS — Z7984 Long term (current) use of oral hypoglycemic drugs: Secondary | ICD-10-CM | POA: Diagnosis not present

## 2019-07-06 DIAGNOSIS — J9621 Acute and chronic respiratory failure with hypoxia: Secondary | ICD-10-CM | POA: Diagnosis present

## 2019-07-06 DIAGNOSIS — J9611 Chronic respiratory failure with hypoxia: Secondary | ICD-10-CM | POA: Diagnosis present

## 2019-07-06 LAB — CBC
HCT: 37.3 % (ref 36.0–46.0)
Hemoglobin: 12 g/dL (ref 12.0–15.0)
MCH: 28.1 pg (ref 26.0–34.0)
MCHC: 32.2 g/dL (ref 30.0–36.0)
MCV: 87.4 fL (ref 80.0–100.0)
Platelets: 311 10*3/uL (ref 150–400)
RBC: 4.27 MIL/uL (ref 3.87–5.11)
RDW: 14.2 % (ref 11.5–15.5)
WBC: 5.5 10*3/uL (ref 4.0–10.5)
nRBC: 0 % (ref 0.0–0.2)

## 2019-07-06 LAB — CREATININE, SERUM
Creatinine, Ser: 1.67 mg/dL — ABNORMAL HIGH (ref 0.44–1.00)
GFR calc Af Amer: 36 mL/min — ABNORMAL LOW (ref >60)
GFR calc non Af Amer: 31 mL/min — ABNORMAL LOW (ref >60)

## 2019-07-06 LAB — COMPREHENSIVE METABOLIC PANEL
ALT: 18 U/L (ref 0–44)
AST: 29 U/L (ref 15–41)
Albumin: 3.7 g/dL (ref 3.5–5.0)
Alkaline Phosphatase: 59 U/L (ref 38–126)
Anion gap: 16 — ABNORMAL HIGH (ref 5–15)
BUN: 20 mg/dL (ref 8–23)
CO2: 25 mmol/L (ref 22–32)
Calcium: 8.9 mg/dL (ref 8.9–10.3)
Chloride: 98 mmol/L (ref 98–111)
Creatinine, Ser: 1.69 mg/dL — ABNORMAL HIGH (ref 0.44–1.00)
GFR calc Af Amer: 36 mL/min — ABNORMAL LOW (ref >60)
GFR calc non Af Amer: 31 mL/min — ABNORMAL LOW (ref >60)
Glucose, Bld: 252 mg/dL — ABNORMAL HIGH (ref 70–99)
Potassium: 3.2 mmol/L — ABNORMAL LOW (ref 3.5–5.1)
Sodium: 139 mmol/L (ref 135–145)
Total Bilirubin: 0.6 mg/dL (ref 0.3–1.2)
Total Protein: 7.2 g/dL (ref 6.5–8.1)

## 2019-07-06 LAB — CBC WITH DIFFERENTIAL/PLATELET
Abs Immature Granulocytes: 0.03 10*3/uL (ref 0.00–0.07)
Basophils Absolute: 0 10*3/uL (ref 0.0–0.1)
Basophils Relative: 0 %
Eosinophils Absolute: 0.1 10*3/uL (ref 0.0–0.5)
Eosinophils Relative: 1 %
HCT: 40.3 % (ref 36.0–46.0)
Hemoglobin: 13 g/dL (ref 12.0–15.0)
Immature Granulocytes: 1 %
Lymphocytes Relative: 12 %
Lymphs Abs: 0.7 10*3/uL (ref 0.7–4.0)
MCH: 28 pg (ref 26.0–34.0)
MCHC: 32.3 g/dL (ref 30.0–36.0)
MCV: 86.7 fL (ref 80.0–100.0)
Monocytes Absolute: 0.4 10*3/uL (ref 0.1–1.0)
Monocytes Relative: 6 %
Neutro Abs: 5.1 10*3/uL (ref 1.7–7.7)
Neutrophils Relative %: 80 %
Platelets: 341 10*3/uL (ref 150–400)
RBC: 4.65 MIL/uL (ref 3.87–5.11)
RDW: 14.1 % (ref 11.5–15.5)
WBC: 6.3 10*3/uL (ref 4.0–10.5)
nRBC: 0 % (ref 0.0–0.2)

## 2019-07-06 LAB — FERRITIN: Ferritin: 250 ng/mL (ref 11–307)

## 2019-07-06 LAB — D-DIMER, QUANTITATIVE: D-Dimer, Quant: 2.19 ug/mL-FEU — ABNORMAL HIGH (ref 0.00–0.50)

## 2019-07-06 LAB — ABO/RH: ABO/RH(D): O POS

## 2019-07-06 LAB — CBG MONITORING, ED
Glucose-Capillary: 121 mg/dL — ABNORMAL HIGH (ref 70–99)
Glucose-Capillary: 300 mg/dL — ABNORMAL HIGH (ref 70–99)

## 2019-07-06 LAB — BRAIN NATRIURETIC PEPTIDE: B Natriuretic Peptide: 51.6 pg/mL (ref 0.0–100.0)

## 2019-07-06 LAB — HEMOGLOBIN A1C
Hgb A1c MFr Bld: 7.9 % — ABNORMAL HIGH (ref 4.8–5.6)
Mean Plasma Glucose: 180.03 mg/dL

## 2019-07-06 LAB — TROPONIN I (HIGH SENSITIVITY): Troponin I (High Sensitivity): 29 ng/L — ABNORMAL HIGH (ref <18)

## 2019-07-06 LAB — LACTATE DEHYDROGENASE: LDH: 356 U/L — ABNORMAL HIGH (ref 98–192)

## 2019-07-06 LAB — FIBRINOGEN: Fibrinogen: 636 mg/dL — ABNORMAL HIGH (ref 210–475)

## 2019-07-06 LAB — TRIGLYCERIDES: Triglycerides: 157 mg/dL — ABNORMAL HIGH (ref <150)

## 2019-07-06 LAB — C-REACTIVE PROTEIN: CRP: 17.2 mg/dL — ABNORMAL HIGH (ref <1.0)

## 2019-07-06 LAB — LACTIC ACID, PLASMA: Lactic Acid, Venous: 3.2 mmol/L (ref 0.5–1.9)

## 2019-07-06 LAB — PROCALCITONIN: Procalcitonin: 0.18 ng/mL

## 2019-07-06 LAB — POC SARS CORONAVIRUS 2 AG -  ED: SARS Coronavirus 2 Ag: POSITIVE — AB

## 2019-07-06 LAB — HEPATITIS B SURFACE ANTIGEN: Hepatitis B Surface Ag: NONREACTIVE

## 2019-07-06 MED ORDER — SODIUM CHLORIDE 0.9 % IV SOLN
200.0000 mg | Freq: Once | INTRAVENOUS | Status: AC
  Administered 2019-07-06: 200 mg via INTRAVENOUS
  Filled 2019-07-06: qty 40

## 2019-07-06 MED ORDER — ACETAMINOPHEN 325 MG PO TABS
650.0000 mg | ORAL_TABLET | Freq: Four times a day (QID) | ORAL | Status: DC | PRN
  Administered 2019-07-09: 650 mg via ORAL
  Filled 2019-07-06: qty 2

## 2019-07-06 MED ORDER — GUAIFENESIN-DM 100-10 MG/5ML PO SYRP
10.0000 mL | ORAL_SOLUTION | ORAL | Status: DC | PRN
  Administered 2019-07-08: 10 mL via ORAL
  Filled 2019-07-06: qty 10

## 2019-07-06 MED ORDER — ONDANSETRON HCL 4 MG PO TABS: 4.0000 mg | ORAL_TABLET | Freq: Four times a day (QID) | ORAL | Status: DC | PRN

## 2019-07-06 MED ORDER — POTASSIUM CHLORIDE IN NACL 20-0.9 MEQ/L-% IV SOLN
INTRAVENOUS | Status: DC
  Filled 2019-07-06 (×3): qty 1000

## 2019-07-06 MED ORDER — INSULIN ASPART 100 UNIT/ML ~~LOC~~ SOLN
0.0000 [IU] | Freq: Three times a day (TID) | SUBCUTANEOUS | Status: DC
  Administered 2019-07-07 (×2): 11 [IU] via SUBCUTANEOUS
  Administered 2019-07-07: 5 [IU] via SUBCUTANEOUS
  Administered 2019-07-08 (×2): 15 [IU] via SUBCUTANEOUS
  Administered 2019-07-08: 5 [IU] via SUBCUTANEOUS
  Administered 2019-07-09: 8 [IU] via SUBCUTANEOUS
  Administered 2019-07-09: 11 [IU] via SUBCUTANEOUS

## 2019-07-06 MED ORDER — ASCORBIC ACID 500 MG PO TABS
500.0000 mg | ORAL_TABLET | Freq: Every day | ORAL | Status: DC
  Administered 2019-07-07 – 2019-07-10 (×4): 500 mg via ORAL
  Filled 2019-07-06 (×4): qty 1

## 2019-07-06 MED ORDER — SODIUM CHLORIDE 0.9 % IV BOLUS
500.0000 mL | Freq: Once | INTRAVENOUS | Status: AC
  Administered 2019-07-06: 500 mL via INTRAVENOUS

## 2019-07-06 MED ORDER — SODIUM CHLORIDE 0.9 % IV SOLN
100.0000 mg | Freq: Every day | INTRAVENOUS | Status: AC
  Administered 2019-07-07 – 2019-07-10 (×4): 100 mg via INTRAVENOUS
  Filled 2019-07-06 (×5): qty 20

## 2019-07-06 MED ORDER — ZINC SULFATE 220 (50 ZN) MG PO CAPS
220.0000 mg | ORAL_CAPSULE | Freq: Every day | ORAL | Status: DC
  Administered 2019-07-07 – 2019-07-10 (×4): 220 mg via ORAL
  Filled 2019-07-06 (×4): qty 1

## 2019-07-06 MED ORDER — DEXAMETHASONE SODIUM PHOSPHATE 10 MG/ML IJ SOLN: 6.0000 mg | Freq: Once | INTRAMUSCULAR | Status: DC

## 2019-07-06 MED ORDER — IPRATROPIUM BROMIDE HFA 17 MCG/ACT IN AERS
2.0000 | INHALATION_SPRAY | Freq: Four times a day (QID) | RESPIRATORY_TRACT | Status: DC
  Administered 2019-07-06 – 2019-07-10 (×15): 2 via RESPIRATORY_TRACT
  Filled 2019-07-06: qty 12.9

## 2019-07-06 MED ORDER — INSULIN ASPART 100 UNIT/ML ~~LOC~~ SOLN
0.0000 [IU] | Freq: Every day | SUBCUTANEOUS | Status: DC
  Administered 2019-07-07 – 2019-07-08 (×3): 3 [IU] via SUBCUTANEOUS

## 2019-07-06 MED ORDER — METHYLPREDNISOLONE SODIUM SUCC 125 MG IJ SOLR
0.5000 mg/kg | Freq: Two times a day (BID) | INTRAMUSCULAR | Status: DC
  Administered 2019-07-06 – 2019-07-10 (×9): 45 mg via INTRAVENOUS
  Filled 2019-07-06 (×10): qty 2

## 2019-07-06 MED ORDER — ONDANSETRON HCL 4 MG/2ML IJ SOLN: 4.0000 mg | Freq: Four times a day (QID) | INTRAMUSCULAR | Status: DC | PRN

## 2019-07-06 MED ORDER — HYDROCOD POLST-CPM POLST ER 10-8 MG/5ML PO SUER: 5.0000 mL | Freq: Two times a day (BID) | ORAL | Status: DC | PRN

## 2019-07-06 MED ORDER — ENOXAPARIN SODIUM 40 MG/0.4ML ~~LOC~~ SOLN
40.0000 mg | SUBCUTANEOUS | Status: DC
  Administered 2019-07-06 – 2019-07-10 (×5): 40 mg via SUBCUTANEOUS
  Filled 2019-07-06 (×5): qty 0.4

## 2019-07-06 NOTE — ED Notes (Addendum)
glucose 300

## 2019-07-06 NOTE — ED Notes (Signed)
Pt given water pitcher and bedside commode. Pt informed to call out using call light when she needed to use bathroom.

## 2019-07-06 NOTE — ED Provider Notes (Signed)
[place] EMERGENCY DEPARTMENT Provider Note   CSN: [id number] Arrival date & time: 07/06/19  1101     History No chief complaint on file.   Erika Leach is a 68 y.o. female.  HPI Patient presents with shortness of breath.  Worse over the last few days.  Her boyfriend is Covid positive.  Has had symptoms for around a week.  She has oxygen as needed at home and will use it at night.  However requiring 5 L of oxygen right now.  Has had no fevers.  Has had a cough without real sputum production.  No abdominal pain.  States she has been fatigued and had a decreased appetite.  Has a history of COPD.    Past Medical History:  Diagnosis Date  . Asthma   . Chest pain 2014   Cath 10/14>>normal CA  . Complete heart block ([place]) 06/24/2012  . HTN (hypertension)    poorly controlled  . 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    Patient Active Problem List   Diagnosis Date Noted  . Acute hypoxemic respiratory failure due to COVID-19 ([place]) 07/06/2019  . DM (diabetes mellitus) ([place]) 07/06/2019  . HLD (hyperlipidemia) 07/06/2019  . Hypokalemia 07/06/2019  . AKI (acute kidney injury) ([place]) 07/06/2019  . Thrush, oral 05/04/2019  . Chronic respiratory failure with hypoxia ([place]) 04/02/2019  . Hypoxemia   . Acute respiratory failure ([place]) 03/21/2019  . COPD exacerbation ([place]) 03/20/2019  . Morbid obesity ([place]) 11/29/2015  . COPD (chronic obstructive pulmonary disease) ([place]) 11/01/2015  . OSA (obstructive sleep apnea) 12/04/2014  . Dyspnea on exertion 07/09/2013  . 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  . Essential hypertension 06/28/2012  . Mild pulmonary hypertension ([place]) 06/28/2012  . Asthma 06/28/2012  . Complete heart block ([place]) 06/24/2012    Past Surgical History:  Procedure Laterality Date  . APPENDECTOMY    . LEFT HEART CATHETERIZATION WITH CORONARY ANGIOGRAM N/A 04/30/2013   Procedure: LEFT HEART CATHETERIZATION WITH CORONARY ANGIOGRAM;  Surgeon: Peter M Martinique, MD;   Location: [facility] LAB;  Service: Cardiovascular;  Laterality: N/A;  . PERMANENT PACEMAKER INSERTION N/A 06/24/2012   Procedure: PERMANENT PACEMAKER INSERTION;  Surgeon: Deboraha Sprang, MD;  Location: [facility] CATH LAB;  Service: Cardiovascular;  Laterality: N/A;  . TUBAL LIGATION       OB History   No obstetric history on file.     Family History  Problem Relation Age of Onset  . Heart attack Mother        Died age 68  . Pneumonia Father   . Breast cancer Sister   . Heart attack Brother        3 MI's - 1st age 34  . Ovarian cancer Sister   . Emphysema Brother        smoked    Social History   Tobacco Use  . Smoking status: Former Smoker    Packs/day: 1.00    Years: 40.00    Pack years: 40.00    Types: Cigarettes    Quit date: 07/08/2012    Years since quitting: 6.9  . Smokeless tobacco: Never Used  Substance Use Topics  . Alcohol use: No    Alcohol/week: 0.0 standard drinks    Comment: rare - special occasions  . Drug use: No    Home Medications Prior to Admission medications   Medication Sig Start Date End Date Taking? Authorizing Provider  albuterol (PROAIR HFA) 108 (90 BASE) MCG/ACT inhaler Inhale 2 puffs into the lungs every 6 (six)  hours as needed for wheezing or shortness of breath.     [provider]  albuterol (PROVENTIL) (2.5 MG/3ML) 0.083% nebulizer solution Take 2.5 mg by nebulization every 6 (six) hours as needed for wheezing or shortness of breath.  10/06/18   [provider]  amLODipine (NORVASC) 10 MG tablet Take 10 mg by mouth daily. 05/03/17   [provider]  bisoprolol (ZEBETA) 5 MG tablet Take 1 tablet (5 mg total) by mouth daily. 04/01/19   Tanda Rockers, MD  budesonide-formoterol (SYMBICORT) 80-4.5 MCG/ACT inhaler Take 2 puffs first thing in am and then another 2 puffs about 12 hours later. 05/03/19   Tanda Rockers, MD  cloNIDine (CATAPRES) 0.1 MG tablet Take 0.1 mg by mouth 2 (two) times daily.    [provider]    fluconazole (DIFLUCAN) 100 MG tablet Take 1 tablet (100 mg total) by mouth daily. 05/03/19   Tanda Rockers, MD  gabapentin (NEURONTIN) 300 MG capsule Take 1 capsule by mouth 3 (three) times daily. 05/08/17   [provider]  glipiZIDE (GLUCOTROL) 5 MG tablet Take 5 mg by mouth daily before breakfast.    [provider]  lovastatin (MEVACOR) 20 MG tablet Take 20 mg by mouth at bedtime.    [provider]  metFORMIN (GLUCOPHAGE) 500 MG tablet Take 500 mg by mouth 2 (two) times daily. 10/13/16   [provider]  predniSONE (DELTASONE) 10 MG tablet Take  4 each am x 2 days,   2 each am x 2 days,  1 each am x 2 days and stop 04/01/19   Tanda Rockers, MD  Tiotropium Bromide Monohydrate (SPIRIVA RESPIMAT) 2.5 MCG/ACT AERS Inhale 2 puffs into the lungs daily. 04/19/19   Tanda Rockers, MD  traMADol (ULTRAM) 50 MG tablet Take 50-100 mg by mouth daily as needed for pain.     [provider]    Allergies    Patient has no known allergies.  Review of Systems   Review of Systems  Constitutional: Positive for appetite change and fatigue. Negative for fever.  HENT: Negative for congestion.   Respiratory: Positive for cough and shortness of breath.   Cardiovascular: Negative for chest pain.  Gastrointestinal: Negative for abdominal pain.  Genitourinary: Negative for frequency.  Musculoskeletal: Negative for back pain.  Skin: Negative for rash.  Neurological: Positive for weakness.    Physical Exam Updated Vital Signs BP 108/62   Pulse 70   Temp 98.7 F (37.1 C) (Oral)   Resp (!) 26   Wt 89.8 kg   LMP  (LMP Unknown)   SpO2 100%   BMI 32.95 kg/m   Physical Exam Vitals and nursing note reviewed.  HENT:     Head: Normocephalic.  Eyes:     Pupils: Pupils are equal, round, and reactive to light.  Cardiovascular:     Rate and Rhythm: Regular rhythm.  Pulmonary:     Breath sounds: Wheezing present.     Comments: Somewhat diffuse wheezes and  harsh breath sounds. Abdominal:     Tenderness: There is no abdominal tenderness.  Musculoskeletal:     Cervical back: Neck supple.     Right lower leg: No edema.     Left lower leg: No edema.  Skin:    General: Skin is warm.     Capillary Refill: Capillary refill takes less than 2 seconds.  Neurological:     Mental Status: She is alert and oriented to person, place, and time.  ED Results / Procedures / Treatments   Labs (all labs ordered are listed, but only abnormal results are displayed) Labs Reviewed  LACTIC ACID, PLASMA - Abnormal; Notable for the following components:      Result Value   Lactic Acid, Venous 3.2 (*)    All other components within normal limits  COMPREHENSIVE METABOLIC PANEL - Abnormal; Notable for the following components:   Potassium 3.2 (*)    Glucose, Bld 252 (*)    Creatinine, Ser 1.69 (*)    GFR calc non Af Amer 31 (*)    GFR calc Af Amer 36 (*)    Anion gap 16 (*)    All other components within normal limits  D-DIMER, QUANTITATIVE (NOT AT [facility]) - Abnormal; Notable for the following components:   D-Dimer, Quant 2.19 (*)    All other components within normal limits  LACTATE DEHYDROGENASE - Abnormal; Notable for the following components:   LDH 356 (*)    All other components within normal limits  FIBRINOGEN - Abnormal; Notable for the following components:   Fibrinogen 636 (*)    All other components within normal limits  C-REACTIVE PROTEIN - Abnormal; Notable for the following components:   CRP 17.2 (*)    All other components within normal limits  TRIGLYCERIDES - Abnormal; Notable for the following components:   Triglycerides 157 (*)    All other components within normal limits  POC SARS CORONAVIRUS 2 AG -  ED - Abnormal; Notable for the following components:   SARS Coronavirus 2 Ag POSITIVE (*)    All other components within normal limits  CULTURE, BLOOD (ROUTINE X 2)  CULTURE, BLOOD (ROUTINE X 2)  CBC WITH DIFFERENTIAL/PLATELET    PROCALCITONIN  FERRITIN  CBC  CREATININE, SERUM  BRAIN NATRIURETIC PEPTIDE  HEPATITIS B SURFACE ANTIGEN  HEMOGLOBIN A1C  ABO/RH  TROPONIN I (HIGH SENSITIVITY)    EKG None  Radiology DG Chest Port 1 View  Result Date: 07/06/2019 CLINICAL DATA:  Shortness of breath.  Possible COVID pneumonia. EXAM: PORTABLE CHEST 1 VIEW COMPARISON:  Chest x-ray 03/20/2019 FINDINGS: The pacer wires are stable. The heart is upper limits of normal in size given the AP projection and unchanged. Stable tortuosity and calcification of the thoracic aorta. Underlying emphysematous changes and pulmonary scarring. I do not see any definite infiltrates or effusions. The bony thorax is intact. IMPRESSION: 1. No acute cardiopulmonary findings. 2. Chronic lung changes with emphysema and pulmonary scarring. Electronically Signed   By: Marijo Sanes M.D.   On: 07/06/2019 12:45    Procedures Procedures (including critical care time)  Medications Ordered in ED Medications  enoxaparin (LOVENOX) injection 40 mg (has no administration in time range)  0.9 % NaCl with KCl 20 mEq/ L  infusion (has no administration in time range)  remdesivir 200 mg in sodium chloride 0.9% 250 mL IVPB (has no administration in time range)    Followed by  remdesivir 100 mg in sodium chloride 0.9 % 100 mL IVPB (has no administration in time range)  ipratropium (ATROVENT HFA) inhaler 2 puff (has no administration in time range)  methylPREDNISolone sodium succinate (SOLU-MEDROL) 125 mg/2 mL injection 45 mg (has no administration in time range)  acetaminophen (TYLENOL) tablet 650 mg (has no administration in time range)  ondansetron (ZOFRAN) tablet 4 mg (has no administration in time range)    Or  ondansetron (ZOFRAN) injection 4 mg (has no administration in time range)  guaiFENesin-dextromethorphan (ROBITUSSIN DM) 100-10 MG/5ML syrup 10 mL (has  no administration in time range)  chlorpheniramine-HYDROcodone (TUSSIONEX) 10-8 MG/5ML suspension  5 mL (has no administration in time range)  ascorbic acid (VITAMIN C) tablet 500 mg (has no administration in time range)  zinc sulfate capsule 220 mg (has no administration in time range)  insulin aspart (novoLOG) injection 0-15 Units (has no administration in time range)  insulin aspart (novoLOG) injection 0-5 Units (has no administration in time range)  sodium chloride 0.9 % bolus 500 mL (500 mLs Intravenous New Bag/Given 07/06/19 1232)    ED Course  I have reviewed the triage vital signs and the nursing notes.  Pertinent labs & imaging results that were available during my care of the patient were reviewed by me and considered in my medical decision making (see chart for details).    MDM Rules/Calculators/A&P                      Patient presents with hypoxia.  History of COPD.  Now requiring 5 L of oxygen.  Has 2 L at home as needed.  With oxygen requirement of 4 L will admit to hospital.  X-ray reassuring.  Steroids given. Final Clinical Impression(s) / ED Diagnoses Final diagnoses:  COVID-19  Acute on chronic respiratory failure with hypoxia [facility])    Rx / DC Orders ED Discharge Orders    None       Davonna Belling, MD 07/06/19 [phone]

## 2019-07-06 NOTE — H&P (Signed)
History and Physical    Erika Leach [id number] DOB: 1951-06-09 DOA: 07/06/2019  PCP: Antonietta Jewel, MD  Patient coming from: Home I have personally briefly reviewed patient's old medical records in [place]  Chief Complaint: Generalized weakness, Covid positive HPI: Erika Leach is a 68 y.o. female with medical history significant of chronic hypoxemic respiratory failure due to underlying COPD-on 2 L of oxygen via nasal cannulae at home as needed, hypertension, hyperlipidemia, diabetes mellitus, diabetic neuropathy, morbid obesity presents to emergency department as she was exposed to her boyfriend who recently tested positive for Covid 2 days ago.  Patient tells me that she has generalized weakness, no energy, decreased appetite, productive cough with whitish-yellow sputum, had diarrhea last week which resolved on its own.  No history of fever, chills, worsening shortness of breath, wheezing, nausea, vomiting, abdominal pain, headache, blurry vision, lightheadedness or dizziness.  She lives with her boyfriend at home and denies smoking, alcohol, street drug use.  ED Course: Upon arrival: Patient's vital signs stable, hypoxic on 84% on room air placed on 3 L of oxygen via nasal cannula.  COVID-19 positive.  CBC shows no leukocytosis.  Patient is afebrile.  BMP shows potassium of 3.2, AKI, inflammatory markers elevated.  Chest x-ray negative for acute findings.  Review of Systems: As per HPI otherwise negative.    Past Medical History:  Diagnosis Date  . Asthma   . Chest pain 2014   Cath 10/14>>normal CA  . Complete heart block ([place]) 06/24/2012  . HTN (hypertension)    poorly controlled  . 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    Past Surgical History:  Procedure Laterality Date  . APPENDECTOMY    . LEFT HEART CATHETERIZATION WITH CORONARY ANGIOGRAM N/A 04/30/2013   Procedure: LEFT HEART CATHETERIZATION WITH CORONARY ANGIOGRAM;  Surgeon: Peter M Martinique, MD;  Location: [facility] CATH  LAB;  Service: Cardiovascular;  Laterality: N/A;  . PERMANENT PACEMAKER INSERTION N/A 06/24/2012   Procedure: PERMANENT PACEMAKER INSERTION;  Surgeon: Deboraha Sprang, MD;  Location: [facility] CATH LAB;  Service: Cardiovascular;  Laterality: N/A;  . TUBAL LIGATION       reports that she quit smoking about 6 years ago. Her smoking use included cigarettes. She has a 40.00 pack-year smoking history. She has never used smokeless tobacco. She reports that she does not drink alcohol or use drugs.  No Known Allergies  Family History  Problem Relation Age of Onset  . Heart attack Mother        Died age 32  . Pneumonia Father   . Breast cancer Sister   . Heart attack Brother        3 MI's - 1st age 13  . Ovarian cancer Sister   . Emphysema Brother        smoked    Prior to Admission medications   Medication Sig Start Date End Date Taking? Authorizing Provider  albuterol (PROAIR HFA) 108 (90 BASE) MCG/ACT inhaler Inhale 2 puffs into the lungs every 6 (six) hours as needed for wheezing or shortness of breath.     [provider]  albuterol (PROVENTIL) (2.5 MG/3ML) 0.083% nebulizer solution Take 2.5 mg by nebulization every 6 (six) hours as needed for wheezing or shortness of breath.  10/06/18   [provider]  amLODipine (NORVASC) 10 MG tablet Take 10 mg by mouth daily. 05/03/17   [provider]  bisoprolol (ZEBETA) 5 MG tablet Take 1 tablet (5 mg total) by mouth daily. 04/01/19   Tanda Rockers,  MD  budesonide-formoterol (SYMBICORT) 80-4.5 MCG/ACT inhaler Take 2 puffs first thing in am and then another 2 puffs about 12 hours later. 05/03/19   Tanda Rockers, MD  cloNIDine (CATAPRES) 0.1 MG tablet Take 0.1 mg by mouth 2 (two) times daily.    [provider]  fluconazole (DIFLUCAN) 100 MG tablet Take 1 tablet (100 mg total) by mouth daily. 05/03/19   Tanda Rockers, MD  gabapentin (NEURONTIN) 300 MG capsule Take 1 capsule by mouth 3 (three) times daily. 05/08/17    [provider]  glipiZIDE (GLUCOTROL) 5 MG tablet Take 5 mg by mouth daily before breakfast.    [provider]  lovastatin (MEVACOR) 20 MG tablet Take 20 mg by mouth at bedtime.    [provider]  metFORMIN (GLUCOPHAGE) 500 MG tablet Take 500 mg by mouth 2 (two) times daily. 10/13/16   [provider]  predniSONE (DELTASONE) 10 MG tablet Take  4 each am x 2 days,   2 each am x 2 days,  1 each am x 2 days and stop 04/01/19   Tanda Rockers, MD  Tiotropium Bromide Monohydrate (SPIRIVA RESPIMAT) 2.5 MCG/ACT AERS Inhale 2 puffs into the lungs daily. 04/19/19   Tanda Rockers, MD  traMADol (ULTRAM) 50 MG tablet Take 50-100 mg by mouth daily as needed for pain.     [provider]    Physical Exam: Vitals:   07/06/19 1215 07/06/19 1230 07/06/19 1245 07/06/19 1330  BP: 101/67  108/66 108/62  Pulse: 71 70    Resp: (!) 24 19 (!) 28 (!) 26  Temp:      TempSrc:      SpO2: 99% 100%      Constitutional: NAD, calm, comfortable, communicating well, on 3 L of oxygen via nasal cannula eyes: PERRL, lids and conjunctivae normal ENMT: Mucous membranes are moist. Posterior pharynx clear of any exudate or lesions.Normal dentition.  Neck: normal, supple, no masses, no thyromegaly Respiratory: clear to auscultation bilaterally, no wheezing, no crackles. Normal respiratory effort. No accessory muscle use.  Cardiovascular: Regular rate and rhythm, no murmurs / rubs / gallops. No extremity edema. 2+ pedal pulses. No carotid bruits.  Abdomen: no tenderness, no masses palpated. No hepatosplenomegaly. Bowel sounds positive.  Musculoskeletal: no clubbing / cyanosis. No joint deformity upper and lower extremities. Good ROM, no contractures. Normal muscle tone.  Skin: no rashes, lesions, ulcers. No induration Neurologic: CN 2-12 grossly intact. Sensation intact, DTR normal. Strength 5/5 in all 4.  Psychiatric: Normal judgment and insight. Alert and oriented x 3. Normal  mood.    Labs on Admission: I have personally reviewed following labs and imaging studies  CBC: Recent Labs  Lab 07/06/19 1213  WBC 6.3  NEUTROABS 5.1  HGB 13.0  HCT 40.3  MCV 86.7  PLT [id number]   Basic Metabolic Panel: Recent Labs  Lab 07/06/19 1213  NA 139  K 3.2*  CL 98  CO2 25  GLUCOSE 252*  BUN 20  CREATININE 1.69*  CALCIUM 8.9   GFR: CrCl cannot be calculated (Unknown ideal weight.). Liver Function Tests: Recent Labs  Lab 07/06/19 1213  AST 29  ALT 18  ALKPHOS 59  BILITOT 0.6  PROT 7.2  ALBUMIN 3.7   No results for input(s): LIPASE, AMYLASE in the last 168 hours. No results for input(s): AMMONIA in the last 168 hours. Coagulation Profile: No results for input(s): INR, PROTIME in the last 168 hours. Cardiac Enzymes: No results for input(s): CKTOTAL,  CKMB, CKMBINDEX, TROPONINI in the last 168 hours. BNP (last 3 results) No results for input(s): PROBNP in the last 8760 hours. HbA1C: No results for input(s): HGBA1C in the last 72 hours. CBG: No results for input(s): GLUCAP in the last 168 hours. Lipid Profile: Recent Labs    07/06/19 1213  TRIG 157*   Thyroid Function Tests: No results for input(s): TSH, T4TOTAL, FREET4, T3FREE, THYROIDAB in the last 72 hours. Anemia Panel: Recent Labs    07/06/19 1213  FERRITIN 250   Urine analysis: No results found for: COLORURINE, APPEARANCEUR, LABSPEC, PHURINE, GLUCOSEU, HGBUR, BILIRUBINUR, KETONESUR, PROTEINUR, UROBILINOGEN, NITRITE, LEUKOCYTESUR  Radiological Exams on Admission: DG Chest Port 1 View  Result Date: 07/06/2019 CLINICAL DATA:  Shortness of breath.  Possible COVID pneumonia. EXAM: PORTABLE CHEST 1 VIEW COMPARISON:  Chest x-ray 03/20/2019 FINDINGS: The pacer wires are stable. The heart is upper limits of normal in size given the AP projection and unchanged. Stable tortuosity and calcification of the thoracic aorta. Underlying emphysematous changes and pulmonary scarring. I do not see any  definite infiltrates or effusions. The bony thorax is intact. IMPRESSION: 1. No acute cardiopulmonary findings. 2. Chronic lung changes with emphysema and pulmonary scarring. Electronically Signed   By: Marijo Sanes M.D.   On: 07/06/2019 12:45    EKG: Ectopic atrial rhythm, prolonged PR interval, atypical RBBB, no ST elevation or depression.  Assessment/Plan Principal Problem:   Acute hypoxemic respiratory failure due to COVID-19 [facility]) Active Problems:   Essential hypertension   COPD (chronic obstructive pulmonary disease) (HCC)   DM (diabetes mellitus) ([place])   HLD (hyperlipidemia)   Hypokalemia   AKI (acute kidney injury) ([place])    Acute hypoxemic respiratory failure due to COVID-19: -Patient recently exposed to COVID-19 from her boyfriend that was +2 days ago.  She has history of COPD and on 2 L of oxygen only as needed at home.  She is requiring 3 L of oxygen currently.  On continuous pulse ox. -Chest x-ray is negative for pneumonia.  Inflammatory markers elevated.  CRP and D-dimer is pending.  Blood culture and procalcitonin is pending.  Patient is afebrile with no leukocytosis. -Admit patient on the floor for close monitoring.  Try to wean off of oxygen. -Start on IV Solu-Medrol and consulted pharmacy for remdesivir. -DuoNebs as needed.  Continue home inhalers. -Start on gentle hydration, avoid NSAIDs.  Start on antitussives and p.o. vitamin C and zinc. -Patient was told that if COVID-19 pneumonitis gets worse we might potentially use Actemra off label, she denies any known history of tuberculosis or hepatitis, understands the risk and benefits and wants to proceed with Actemra treatment if required.  AKI: -Secondary to dehydration and decreased p.o. intake -Continue with gentle hydration and monitor kidney function closely.  Hypokalemia: Replenished -Repeat BMP tomorrow AM.  Check magnesium level  Hypertension: Blood pressure is stable -Continue her home meds-amlodipine and  bisoprolol  Hyperlipidemia: Continue statin  Diabetes mellitus: Hold p.o. home medicines-Metformin and glipizide.  Check A1c.  And start patient on sliding scale insulin and monitor blood sugar closely.  Diabetic neuropathy: Continue gabapentin.  History of complete heart block status post pacemaker in place: Stable -Patient is asymptomatic.  On telemetry.  DVT prophylaxis: TED/SCD/Lovenox Code Status: Full code Family Communication: None present at bedside.  Plan of care discussed with patient in length and he verbalized understanding and agreed with it. Disposition Plan: To be determined Consults called: None Admission status: Inpatient   Mckinley Jewel MD [facility] Pager  [phone]  If 7PM-7AM, please contact night-coverage [email or web address] Password [email or web address]  07/06/2019, 2:09 PM

## 2019-07-06 NOTE — ED Triage Notes (Signed)
Pt here from home asa covid exposure from her boyfriend that was positive 2 days ago , pt is on O2 at night and has copd but is requiring O2 on arrival

## 2019-07-06 NOTE — ED Notes (Signed)
Dinner Tray Ordered @ [phone].

## 2019-07-07 DIAGNOSIS — E785 Hyperlipidemia, unspecified: Secondary | ICD-10-CM

## 2019-07-07 DIAGNOSIS — E876 Hypokalemia: Secondary | ICD-10-CM

## 2019-07-07 DIAGNOSIS — N179 Acute kidney failure, unspecified: Secondary | ICD-10-CM

## 2019-07-07 DIAGNOSIS — J9621 Acute and chronic respiratory failure with hypoxia: Secondary | ICD-10-CM

## 2019-07-07 DIAGNOSIS — I10 Essential (primary) hypertension: Secondary | ICD-10-CM

## 2019-07-07 DIAGNOSIS — J449 Chronic obstructive pulmonary disease, unspecified: Secondary | ICD-10-CM

## 2019-07-07 LAB — D-DIMER, QUANTITATIVE: D-Dimer, Quant: 1.73 ug/mL-FEU — ABNORMAL HIGH (ref 0.00–0.50)

## 2019-07-07 LAB — CBC WITH DIFFERENTIAL/PLATELET
Abs Immature Granulocytes: 0 10*3/uL (ref 0.00–0.07)
Basophils Absolute: 0 10*3/uL (ref 0.0–0.1)
Basophils Relative: 0 %
Eosinophils Absolute: 0 10*3/uL (ref 0.0–0.5)
Eosinophils Relative: 0 %
HCT: 37.6 % (ref 36.0–46.0)
Hemoglobin: 12.1 g/dL (ref 12.0–15.0)
Lymphocytes Relative: 23 %
Lymphs Abs: 0.9 10*3/uL (ref 0.7–4.0)
MCH: 27.8 pg (ref 26.0–34.0)
MCHC: 32.2 g/dL (ref 30.0–36.0)
MCV: 86.2 fL (ref 80.0–100.0)
Monocytes Absolute: 0.2 10*3/uL (ref 0.1–1.0)
Monocytes Relative: 5 %
Myelocytes: 1 %
Neutro Abs: 2.8 10*3/uL (ref 1.7–7.7)
Neutrophils Relative %: 71 %
Platelets: 358 10*3/uL (ref 150–400)
RBC: 4.36 MIL/uL (ref 3.87–5.11)
RDW: 13.6 % (ref 11.5–15.5)
WBC: 4 10*3/uL (ref 4.0–10.5)
nRBC: 0 % (ref 0.0–0.2)
nRBC: 0 /100 WBC

## 2019-07-07 LAB — COMPREHENSIVE METABOLIC PANEL
ALT: 19 U/L (ref 0–44)
AST: 25 U/L (ref 15–41)
Albumin: 3.4 g/dL — ABNORMAL LOW (ref 3.5–5.0)
Alkaline Phosphatase: 52 U/L (ref 38–126)
Anion gap: 15 (ref 5–15)
BUN: 21 mg/dL (ref 8–23)
CO2: 25 mmol/L (ref 22–32)
Calcium: 9.1 mg/dL (ref 8.9–10.3)
Chloride: 98 mmol/L (ref 98–111)
Creatinine, Ser: 0.86 mg/dL (ref 0.44–1.00)
GFR calc Af Amer: >60 mL/min (ref >60)
GFR calc non Af Amer: >60 mL/min (ref >60)
Glucose, Bld: 221 mg/dL — ABNORMAL HIGH (ref 70–99)
Potassium: 3.5 mmol/L (ref 3.5–5.1)
Sodium: 138 mmol/L (ref 135–145)
Total Bilirubin: 0.7 mg/dL (ref 0.3–1.2)
Total Protein: 6.6 g/dL (ref 6.5–8.1)

## 2019-07-07 LAB — C-REACTIVE PROTEIN: CRP: 13.8 mg/dL — ABNORMAL HIGH (ref <1.0)

## 2019-07-07 LAB — NOVEL CORONAVIRUS, NAA: SARS-CoV-2, NAA: DETECTED — AB

## 2019-07-07 LAB — CBG MONITORING, ED: Glucose-Capillary: 311 mg/dL — ABNORMAL HIGH (ref 70–99)

## 2019-07-07 LAB — MAGNESIUM: Magnesium: 1.5 mg/dL — ABNORMAL LOW (ref 1.7–2.4)

## 2019-07-07 LAB — FERRITIN: Ferritin: 231 ng/mL (ref 11–307)

## 2019-07-07 LAB — PHOSPHORUS: Phosphorus: 3 mg/dL (ref 2.5–4.6)

## 2019-07-07 MED ORDER — GUAIFENESIN ER 600 MG PO TB12
1200.0000 mg | ORAL_TABLET | Freq: Two times a day (BID) | ORAL | Status: DC
  Administered 2019-07-07 – 2019-07-10 (×7): 1200 mg via ORAL
  Filled 2019-07-07 (×7): qty 2

## 2019-07-07 MED ORDER — LEVALBUTEROL TARTRATE 45 MCG/ACT IN AERO
2.0000 | INHALATION_SPRAY | Freq: Four times a day (QID) | RESPIRATORY_TRACT | Status: DC
  Administered 2019-07-07 – 2019-07-10 (×12): 2 via RESPIRATORY_TRACT
  Filled 2019-07-07 (×2): qty 15

## 2019-07-07 MED ORDER — FLUTICASONE PROPIONATE 50 MCG/ACT NA SUSP
2.0000 | Freq: Every day | NASAL | Status: DC
  Filled 2019-07-07: qty 16

## 2019-07-07 MED ORDER — PANTOPRAZOLE SODIUM 40 MG PO TBEC
40.0000 mg | DELAYED_RELEASE_TABLET | Freq: Every day | ORAL | Status: DC
  Administered 2019-07-07 – 2019-07-10 (×4): 40 mg via ORAL
  Filled 2019-07-07 (×4): qty 1

## 2019-07-07 MED ORDER — INSULIN GLARGINE 100 UNIT/ML ~~LOC~~ SOLN
10.0000 [IU] | SUBCUTANEOUS | Status: DC
  Administered 2019-07-07: 10 [IU] via SUBCUTANEOUS
  Filled 2019-07-07 (×2): qty 0.1

## 2019-07-07 MED ORDER — TRAMADOL HCL 50 MG PO TABS
50.0000 mg | ORAL_TABLET | Freq: Once | ORAL | Status: AC
  Administered 2019-07-07: 50 mg via ORAL
  Filled 2019-07-07: qty 1

## 2019-07-07 MED ORDER — LORATADINE 10 MG PO TABS
10.0000 mg | ORAL_TABLET | Freq: Every day | ORAL | Status: DC
  Administered 2019-07-07 – 2019-07-10 (×4): 10 mg via ORAL
  Filled 2019-07-07 (×4): qty 1

## 2019-07-07 MED ORDER — GABAPENTIN 300 MG PO CAPS
300.0000 mg | ORAL_CAPSULE | Freq: Three times a day (TID) | ORAL | Status: DC
  Administered 2019-07-07 – 2019-07-10 (×9): 300 mg via ORAL
  Filled 2019-07-07 (×9): qty 1

## 2019-07-07 MED ORDER — MOMETASONE FURO-FORMOTEROL FUM 200-5 MCG/ACT IN AERO
2.0000 | INHALATION_SPRAY | Freq: Two times a day (BID) | RESPIRATORY_TRACT | Status: DC
  Administered 2019-07-07 – 2019-07-10 (×7): 2 via RESPIRATORY_TRACT
  Filled 2019-07-07: qty 8.8

## 2019-07-07 MED ORDER — MAGNESIUM SULFATE 4 GM/100ML IV SOLN
4.0000 g | Freq: Once | INTRAVENOUS | Status: AC
  Administered 2019-07-07: 4 g via INTRAVENOUS
  Filled 2019-07-07: qty 100

## 2019-07-07 MED ORDER — PRAVASTATIN SODIUM 10 MG PO TABS
20.0000 mg | ORAL_TABLET | Freq: Every day | ORAL | Status: DC
  Administered 2019-07-08 – 2019-07-09 (×2): 20 mg via ORAL
  Filled 2019-07-07 (×2): qty 2

## 2019-07-07 MED ORDER — POTASSIUM CHLORIDE CRYS ER 20 MEQ PO TBCR
40.0000 meq | EXTENDED_RELEASE_TABLET | Freq: Once | ORAL | Status: AC
  Administered 2019-07-07: 40 meq via ORAL
  Filled 2019-07-07: qty 2

## 2019-07-07 NOTE — Progress Notes (Signed)
PROGRESS NOTE    Erika Leach  [id number] DOB: [birth year] DOA: 07/06/2019 PCP: Antonietta Jewel, MD    Brief Narrative:  HPI per Dr. Twana First [place] is a 68 y.o. female with medical history significant of chronic hypoxemic respiratory failure due to underlying COPD-on 2 L of oxygen via nasal cannulae at home as needed, hypertension, hyperlipidemia, diabetes mellitus, diabetic neuropathy, morbid obesity presents to emergency department as she was exposed to her boyfriend who recently tested positive for Covid 2 days ago.  Patient tells me that she has generalized weakness, no energy, decreased appetite, productive cough with whitish-yellow sputum, had diarrhea last week which resolved on its own.  No history of fever, chills, worsening shortness of breath, wheezing, nausea, vomiting, abdominal pain, headache, blurry vision, lightheadedness or dizziness.  She lives with her boyfriend at home and denies smoking, alcohol, street drug use.  ED Course: Upon arrival: Patient's vital signs stable, hypoxic on 84% on room air placed on 3 L of oxygen via nasal cannula.  COVID-19 positive.  CBC shows no leukocytosis.  Patient is afebrile.  BMP shows potassium of 3.2, AKI, inflammatory markers elevated.  Chest x-ray negative for acute findings.  Assessment & Plan:   Principal Problem:   Acute hypoxemic respiratory failure due to COVID-19 [facility]) Active Problems:   Essential hypertension   COPD (chronic obstructive pulmonary disease) (HCC)   Morbid obesity (HCC)   Chronic respiratory failure with hypoxia (HCC)   DM (diabetes mellitus) ([place])   HLD (hyperlipidemia)   Hypokalemia   AKI (acute kidney injury) ([place])   Acute on chronic respiratory failure with hypoxia (HCC)   Hypomagnesemia  1 acute on chronic hypoxemic respiratory failure due to COVID-19 infection Patient had presented noted to be hypoxic with sats of 84% on room air and requiring 3 L nasal cannula more than her baseline.   Patient with history of COPD on chronic home O2 of 2 L at bedtime.  Chest x-ray was negative for any acute infiltrates.  Inflammatory markers elevated.  Afebrile.  Blood cultures pending.  Patient with poor air movement on examination.  Continue IV Solu-Medrol, IV remdesivir.  Placed on scheduled Xopenex and Atrovent MDIs, Claritin, Flonase, Dulera, Protonix, zinc, vitamin C, Mucinex.  Supportive care.  2.  COPD Stable.  See #1.  3.  Hypokalemia/hypomagnesemia Replete.  Keep potassium greater than 4.  Keep magnesium greater than 2.  4.  Hyperlipidemia Resume home regimen statin.  5.  Diabetes mellitus type 2 with neuropathy We will hold oral hypoglycemic agents.  Hemoglobin A1c of 7.9.  CBG of 300 last night.  Placed on Lantus 10 units daily.  Sliding scale insulin.  Resume home regimen Neurontin.  Follow.  6.  Hypertension Blood pressure somewhat borderline.  Hold antihypertensive medications for now.  Follow.  7.  Acute kidney injury Likely secondary to prerenal azotemia in the setting of dehydration, in the setting of diuretic.  Renal function improving with hydration.  Follow.  8.  Morbid obesity  9.  History of complete heart block status post PPM Stable.  Asymptomatic.  Monitor on telemetry.    DVT prophylaxis: Lovenox Code Status: Full Family Communication: Updated patient.  No family at bedside. Disposition Plan: Likely home when clinically improved.   Consultants:   None  Procedures:   Chest x-ray 07/06/2019    Antimicrobials:  IV remdesivir 07/06/2019   Subjective: Patient sitting up in chair.  States some improvement with shortness of breath since admission.  Patient denies any further diarrhea.  Patient still with some complaints of generalized weakness.   Objective: Vitals:   07/07/19 0440 07/07/19 0845 07/07/19 1411 07/07/19 1623  BP:  (!) 143/79 125/63 (!) 143/80  Pulse: 77 72 (!) 53 85  Resp: 15 (!) 22 18 17   Temp:   98.1 F (36.7 C) 97.7 F  (36.5 C)  TempSrc:   Oral Oral  SpO2: 95% 95% (!) 86% 93%  Weight:        Intake/Output Summary (Last 24 hours) at 07/07/2019 1901 Last data filed at 07/07/2019 1600 Gross per 24 hour  Intake 365.12 ml  Output --  Net 365.12 ml   Filed Weights   07/06/19 1400  Weight: 89.8 kg    Examination:  General exam: NAD Respiratory system: Poor air movement.  No crackles noted.  No wheezing noted.  Speaking in full sentences.  Respiratory effort normal. Cardiovascular system: S1 & S2 heard, RRR. No JVD, murmurs, rubs, gallops or clicks. No pedal edema. Gastrointestinal system: Abdomen is nondistended, soft and nontender. No organomegaly or masses felt. Normal bowel sounds heard. Central nervous system: Alert and oriented. No focal neurological deficits. Extremities: Symmetric 5 x 5 power. Skin: No rashes, lesions or ulcers Psychiatry: Judgement and insight appear normal. Mood & affect appropriate.     Data Reviewed: I have personally reviewed following labs and imaging studies  CBC: Recent Labs  Lab 07/06/19 1213 07/06/19 1630 07/07/19 0651  WBC 6.3 5.5 4.0  NEUTROABS 5.1  --  2.8  HGB 13.0 12.0 12.1  HCT 40.3 37.3 37.6  MCV 86.7 87.4 86.2  PLT 341 311 [id number]   Basic Metabolic Panel: Recent Labs  Lab 07/06/19 1213 07/06/19 1630 07/07/19 0651  NA 139  --  138  K 3.2*  --  3.5  CL 98  --  98  CO2 25  --  25  GLUCOSE 252*  --  221*  BUN 20  --  21  CREATININE 1.69* 1.67* 0.86  CALCIUM 8.9  --  9.1  MG  --   --  1.5*  PHOS  --   --  3.0   GFR: Estimated Creatinine Clearance: 69.3 mL/min (by C-G formula based on SCr of 0.86 mg/dL). Liver Function Tests: Recent Labs  Lab 07/06/19 1213 07/07/19 0651  AST 29 25  ALT 18 19  ALKPHOS 59 52  BILITOT 0.6 0.7  PROT 7.2 6.6  ALBUMIN 3.7 3.4*   No results for input(s): LIPASE, AMYLASE in the last 168 hours. No results for input(s): AMMONIA in the last 168 hours. Coagulation Profile: No results for input(s): INR,  PROTIME in the last 168 hours. Cardiac Enzymes: No results for input(s): CKTOTAL, CKMB, CKMBINDEX, TROPONINI in the last 168 hours. BNP (last 3 results) No results for input(s): PROBNP in the last 8760 hours. HbA1C: Recent Labs    07/06/19 1630  HGBA1C 7.9*   CBG: Recent Labs  Lab 07/06/19 1732 07/06/19 2324 07/07/19 1156  GLUCAP 121* 300* 311*   Lipid Profile: Recent Labs    07/06/19 1213  TRIG 157*   Thyroid Function Tests: No results for input(s): TSH, T4TOTAL, FREET4, T3FREE, THYROIDAB in the last 72 hours. Anemia Panel: Recent Labs    07/06/19 1213 07/07/19 0651  FERRITIN 250 231   Sepsis Labs: Recent Labs  Lab 07/06/19 1213 07/06/19 1230  PROCALCITON 0.18  --   LATICACIDVEN  --  3.2*    Recent Results (from the past 240 hour(s))  Blood Culture (routine x 2)  Status: None (Preliminary result)   Collection Time: 07/06/19 12:22 PM   Specimen: BLOOD  Result Value Ref Range Status   Specimen Description BLOOD RIGHT ANTECUBITAL  Final   Special Requests   Final    BOTTLES DRAWN AEROBIC AND ANAEROBIC Blood Culture results may not be optimal due to an inadequate volume of blood received in culture bottles   Culture   Final    NO GROWTH 1 DAY Performed at [facility], [place] [address], [place], [place] [address]    Report Status PENDING  Incomplete  Blood Culture (routine x 2)     Status: None (Preliminary result)   Collection Time: 07/06/19 12:22 PM   Specimen: BLOOD RIGHT HAND  Result Value Ref Range Status   Specimen Description BLOOD RIGHT HAND  Final   Special Requests   Final    BOTTLES DRAWN AEROBIC AND ANAEROBIC Blood Culture results may not be optimal due to an inadequate volume of blood received in culture bottles   Culture   Final    NO GROWTH 1 DAY Performed at [facility], [place] [address], [place], [place] [address]    Report Status PENDING  Incomplete         Radiology Studies: DG Chest Port 1 View  Result Date:  07/06/2019 CLINICAL DATA:  Shortness of breath.  Possible COVID pneumonia. EXAM: PORTABLE CHEST 1 VIEW COMPARISON:  Chest x-ray 03/20/2019 FINDINGS: The pacer wires are stable. The heart is upper limits of normal in size given the AP projection and unchanged. Stable tortuosity and calcification of the thoracic aorta. Underlying emphysematous changes and pulmonary scarring. I do not see any definite infiltrates or effusions. The bony thorax is intact. IMPRESSION: 1. No acute cardiopulmonary findings. 2. Chronic lung changes with emphysema and pulmonary scarring. Electronically Signed   By: Marijo Sanes M.D.   On: 07/06/2019 12:45        Scheduled Meds: . vitamin C  500 mg Oral Daily  . enoxaparin (LOVENOX) injection  40 mg Subcutaneous Q24H  . fluticasone  2 spray Each Nare Daily  . gabapentin  300 mg Oral TID  . guaiFENesin  1,200 mg Oral BID  . insulin aspart  0-15 Units Subcutaneous TID WC  . insulin aspart  0-5 Units Subcutaneous QHS  . insulin glargine  10 Units Subcutaneous BH-q7a  . ipratropium  2 puff Inhalation Q6H  . levalbuterol  2 puff Inhalation Q6H  . loratadine  10 mg Oral Daily  . methylPREDNISolone (SOLU-MEDROL) injection  0.5 mg/kg Intravenous Q12H  . mometasone-formoterol  2 puff Inhalation BID  . pantoprazole  40 mg Oral Q0600  . [START ON 07/08/2019] pravastatin  20 mg Oral q1800  . zinc sulfate  220 mg Oral Daily   Continuous Infusions: . 0.9 % NaCl with KCl 20 mEq / L 75 mL/hr at 07/07/19 1454  . remdesivir 100 mg in NS 100 mL Stopped (07/07/19 1203)     LOS: 1 day    Time spent: 40 minutes    Irine Seal, MD [facility]  If 7PM-7AM, please contact night-coverage [email or web address] 07/07/2019, 7:01 PM

## 2019-07-07 NOTE — Progress Notes (Signed)
Inpatient Diabetes Program Recommendations  AACE/ADA: New Consensus Statement on Inpatient Glycemic Control (2015)  Target Ranges:  Prepandial:   less than 140 mg/dL      Peak postprandial:   less than 180 mg/dL (1-2 hours)      Critically ill patients:  140 - 180 mg/dL   Lab Results  Component Value Date   GLUCAP 311 (H) 07/07/2019   HGBA1C 7.9 (H) 07/06/2019    Review of Glycemic Control Results for Erika Leach, Erika Leach (MRN [id number]) as of 07/07/2019 15:41  Ref. Range 07/06/2019 17:32 07/06/2019 23:24 07/07/2019 11:56  Glucose-Capillary Latest Ref Range: 70 - 99 mg/dL 121 (H) 300 (H) 311 (H)   Diabetes history: DM 2 Outpatient Diabetes medications:  Glucotrol 5 mg daily, Metformin 500 mg bid Current orders for Inpatient glycemic control:  Novolog moderate tid with meals and HS Lantus 10 unit q AM (12/30) Solumedrol 45 mg IV bid Inpatient Diabetes Program Recommendations:    May consider adding Novolog meal coverage 4 units tid with meals (hold if patient eats less than 50%).   Thanks,  Adah Perl, RN, BC-ADM Inpatient Diabetes Coordinator Pager [phone] (8a-5p)

## 2019-07-07 NOTE — ED Notes (Signed)
MS   Breakfast ordered  

## 2019-07-07 NOTE — ED Notes (Signed)
Requested a hospital bed for pt.  °

## 2019-07-07 NOTE — ED Notes (Signed)
Attempted to give report, bed still dirty

## 2019-07-08 LAB — COMPREHENSIVE METABOLIC PANEL
ALT: 19 U/L (ref 0–44)
AST: 29 U/L (ref 15–41)
Albumin: 3.6 g/dL (ref 3.5–5.0)
Alkaline Phosphatase: 51 U/L (ref 38–126)
Anion gap: 13 (ref 5–15)
BUN: 17 mg/dL (ref 8–23)
CO2: 25 mmol/L (ref 22–32)
Calcium: 9 mg/dL (ref 8.9–10.3)
Chloride: 101 mmol/L (ref 98–111)
Creatinine, Ser: 0.71 mg/dL (ref 0.44–1.00)
GFR calc Af Amer: >60 mL/min (ref >60)
GFR calc non Af Amer: >60 mL/min (ref >60)
Glucose, Bld: 262 mg/dL — ABNORMAL HIGH (ref 70–99)
Potassium: 3.9 mmol/L (ref 3.5–5.1)
Sodium: 139 mmol/L (ref 135–145)
Total Bilirubin: 0.4 mg/dL (ref 0.3–1.2)
Total Protein: 6.9 g/dL (ref 6.5–8.1)

## 2019-07-08 LAB — CBC WITH DIFFERENTIAL/PLATELET
Abs Immature Granulocytes: 0 10*3/uL (ref 0.00–0.07)
Basophils Absolute: 0 10*3/uL (ref 0.0–0.1)
Basophils Relative: 0 %
Eosinophils Absolute: 0 10*3/uL (ref 0.0–0.5)
Eosinophils Relative: 0 %
HCT: 36.8 % (ref 36.0–46.0)
Hemoglobin: 12.6 g/dL (ref 12.0–15.0)
Lymphocytes Relative: 16 %
Lymphs Abs: 1.5 10*3/uL (ref 0.7–4.0)
MCH: 28.3 pg (ref 26.0–34.0)
MCHC: 34.2 g/dL (ref 30.0–36.0)
MCV: 82.7 fL (ref 80.0–100.0)
Monocytes Absolute: 0.6 10*3/uL (ref 0.1–1.0)
Monocytes Relative: 7 %
Neutro Abs: 7.1 10*3/uL (ref 1.7–7.7)
Neutrophils Relative %: 77 %
Platelets: 336 10*3/uL (ref 150–400)
RBC: 4.45 MIL/uL (ref 3.87–5.11)
RDW: 13.6 % (ref 11.5–15.5)
WBC: 9.2 10*3/uL (ref 4.0–10.5)
nRBC: 0 % (ref 0.0–0.2)
nRBC: 0 /100 WBC

## 2019-07-08 LAB — C-REACTIVE PROTEIN: CRP: 7.2 mg/dL — ABNORMAL HIGH (ref <1.0)

## 2019-07-08 LAB — FERRITIN: Ferritin: 325 ng/mL — ABNORMAL HIGH (ref 11–307)

## 2019-07-08 LAB — MAGNESIUM: Magnesium: 1.4 mg/dL — ABNORMAL LOW (ref 1.7–2.4)

## 2019-07-08 LAB — PHOSPHORUS: Phosphorus: 2.3 mg/dL — ABNORMAL LOW (ref 2.5–4.6)

## 2019-07-08 LAB — D-DIMER, QUANTITATIVE: D-Dimer, Quant: 2.89 ug/mL-FEU — ABNORMAL HIGH (ref 0.00–0.50)

## 2019-07-08 LAB — PROCALCITONIN: Procalcitonin: 0.13 ng/mL

## 2019-07-08 MED ORDER — INSULIN ASPART 100 UNIT/ML ~~LOC~~ SOLN
4.0000 [IU] | Freq: Three times a day (TID) | SUBCUTANEOUS | Status: DC
  Administered 2019-07-08 – 2019-07-09 (×3): 4 [IU] via SUBCUTANEOUS

## 2019-07-08 MED ORDER — MAGNESIUM SULFATE 4 GM/100ML IV SOLN
4.0000 g | Freq: Once | INTRAVENOUS | Status: AC
  Administered 2019-07-08: 4 g via INTRAVENOUS
  Filled 2019-07-08: qty 100

## 2019-07-08 MED ORDER — POTASSIUM PHOSPHATES 15 MMOLE/5ML IV SOLN
30.0000 mmol | Freq: Once | INTRAVENOUS | Status: AC
  Administered 2019-07-08: 30 mmol via INTRAVENOUS
  Filled 2019-07-08: qty 10

## 2019-07-08 MED ORDER — INSULIN GLARGINE 100 UNIT/ML ~~LOC~~ SOLN: 14.0000 [IU] | Freq: Every day | SUBCUTANEOUS | Status: DC

## 2019-07-08 MED ORDER — INSULIN GLARGINE 100 UNIT/ML ~~LOC~~ SOLN
14.0000 [IU] | Freq: Every day | SUBCUTANEOUS | Status: DC
  Administered 2019-07-08 – 2019-07-09 (×2): 14 [IU] via SUBCUTANEOUS
  Filled 2019-07-08 (×2): qty 0.14

## 2019-07-08 NOTE — Progress Notes (Signed)
PROGRESS NOTE    Erika Leach  [id number] DOB: [birth year] DOA: 07/06/2019 PCP: Antonietta Jewel, MD    Brief Narrative:  HPI per Dr. Twana First [place] is a 68 y.o. female with medical history significant of chronic hypoxemic respiratory failure due to underlying COPD-on 2 L of oxygen via nasal cannulae at home as needed, hypertension, hyperlipidemia, diabetes mellitus, diabetic neuropathy, morbid obesity presents to emergency department as she was exposed to her boyfriend who recently tested positive for Covid 2 days ago.  Patient tells me that she has generalized weakness, no energy, decreased appetite, productive cough with whitish-yellow sputum, had diarrhea last week which resolved on its own.  No history of fever, chills, worsening shortness of breath, wheezing, nausea, vomiting, abdominal pain, headache, blurry vision, lightheadedness or dizziness.  She lives with her boyfriend at home and denies smoking, alcohol, street drug use.  ED Course: Upon arrival: Patient's vital signs stable, hypoxic on 84% on room air placed on 3 L of oxygen via nasal cannula.  COVID-19 positive.  CBC shows no leukocytosis.  Patient is afebrile.  BMP shows potassium of 3.2, AKI, inflammatory markers elevated.  Chest x-ray negative for acute findings.  Assessment & Plan:   Principal Problem:   Acute hypoxemic respiratory failure due to COVID-19 [facility]) Active Problems:   Essential hypertension   COPD (chronic obstructive pulmonary disease) (HCC)   Morbid obesity (HCC)   Chronic respiratory failure with hypoxia (HCC)   DM (diabetes mellitus) ([place])   HLD (hyperlipidemia)   Hypokalemia   AKI (acute kidney injury) ([place])   Acute on chronic respiratory failure with hypoxia (HCC)   Hypomagnesemia  1 acute on chronic hypoxemic respiratory failure due to COVID-19 infection Patient had presented noted to be hypoxic with sats of 84% on room air and requiring 3 L nasal cannula more than her baseline.   Patient with history of COPD on chronic home O2 of 2 L at bedtime.  Chest x-ray was negative for any acute infiltrates.  Inflammatory markers elevated.  Afebrile.  Blood cultures pending.  Patient with improvement with any movement on examination today from admission.  Continue IV Solu-Medrol, IV remdesivir, Xopenex and Atrovent MDIs, Claritin, Flonase, Dulera, Protonix, zinc, vitamin C, Mucinex.  Supportive care.  2.  COPD Stable.  See #1.  3.  Hypokalemia/hypomagnesemia Repleted.  Potassium at 3.9 this morning.  Magnesium at 1.4.  We will give magnesium sulfate 4 g IV x1.  Keep potassium greater than 4.  Keep magnesium greater than 2.  4.  Hyperlipidemia Continue statin.  5.  Diabetes mellitus type 2 with neuropathy Elevated CBGs likely secondary to steroids.  Hemoglobin A1c of 7.9.  CBG of 262 on lab work this morning.  Increase Lantus to 14 units daily.  Placed on meal coverage NovoLog 4 units 3 times daily with meals.  Sliding scale insulin.  Continue home regimen Neurontin.  Continue to hold home dose oral hypoglycemic agents.  6.  Hypertension Blood pressure somewhat borderline.  Continue to hold antihypertensive medications.  Follow.   7.  Acute kidney injury Likely secondary to prerenal azotemia in the setting of dehydration, in the setting of diuretic.  Follow.  8.  Morbid obesity  9.  History of complete heart block status post PPM Stable.  Asymptomatic.  Monitor on telemetry.     DVT prophylaxis: Lovenox Code Status: Full Family Communication: Updated patient.  No family at bedside. Disposition Plan: Likely home when clinically improved with completion of IV remdesivir.   Consultants:  None  Procedures:   Chest x-ray 07/06/2019    Antimicrobials:  IV remdesivir 07/06/2019   Subjective: Patient laying in bed.  States she is feeling better than she did on admission.  Denies any significant shortness of breath.  Denies any chest pain.  Some complaints of  generalized weakness.   Objective: Vitals:   07/07/19 1623 07/07/19 1944 07/07/19 2314 07/08/19 0831  BP: (!) 143/80 138/84 (!) 142/64 (!) 143/81  Pulse: 85 84 78 76  Resp: 17     Temp: 97.7 F (36.5 C) 98.6 F (37 C) 97.9 F (36.6 C) 98.9 F (37.2 C)  TempSrc: Oral Oral Oral Oral  SpO2: 93% 95% 91% 94%  Weight:        Intake/Output Summary (Last 24 hours) at 07/08/2019 1027 Last data filed at 07/07/2019 1600 Gross per 24 hour  Intake 365.12 ml  Output --  Net 365.12 ml   Filed Weights   07/06/19 1400  Weight: 89.8 kg    Examination:  General exam: NAD Respiratory system: Fair air movement.  Poor air movement.  No crackles noted.  No wheezing noted.  Speaking in full sentences.  Respiratory effort normal. Cardiovascular system: Regular rate rhythm no murmurs rubs or gallops.  No JVD.  No lower extremity edema.  Gastrointestinal system: Abdomen is soft, nontender, nondistended, positive bowel sounds.  No rebound.  No guarding.  Central nervous system: Alert and oriented. No focal neurological deficits. Extremities: Symmetric 5 x 5 power. Skin: No rashes, lesions or ulcers Psychiatry: Judgement and insight appear normal. Mood & affect appropriate.     Data Reviewed: I have personally reviewed following labs and imaging studies  CBC: Recent Labs  Lab 07/06/19 1213 07/06/19 1630 07/07/19 0651 07/08/19 0420  WBC 6.3 5.5 4.0 9.2  NEUTROABS 5.1  --  2.8 7.1  HGB 13.0 12.0 12.1 12.6  HCT 40.3 37.3 37.6 36.8  MCV 86.7 87.4 86.2 82.7  PLT 341 311 358 [id number]   Basic Metabolic Panel: Recent Labs  Lab 07/06/19 1213 07/06/19 1630 07/07/19 0651 07/08/19 0420  NA 139  --  138 139  K 3.2*  --  3.5 3.9  CL 98  --  98 101  CO2 25  --  25 25  GLUCOSE 252*  --  221* 262*  BUN 20  --  21 17  CREATININE 1.69* 1.67* 0.86 0.71  CALCIUM 8.9  --  9.1 9.0  MG  --   --  1.5* 1.4*  PHOS  --   --  3.0 2.3*   GFR: Estimated Creatinine Clearance: 74.5 mL/min (by C-G formula  based on SCr of 0.71 mg/dL). Liver Function Tests: Recent Labs  Lab 07/06/19 1213 07/07/19 0651 07/08/19 0420  AST 29 25 29   ALT 18 19 19   ALKPHOS 59 52 51  BILITOT 0.6 0.7 0.4  PROT 7.2 6.6 6.9  ALBUMIN 3.7 3.4* 3.6   No results for input(s): LIPASE, AMYLASE in the last 168 hours. No results for input(s): AMMONIA in the last 168 hours. Coagulation Profile: No results for input(s): INR, PROTIME in the last 168 hours. Cardiac Enzymes: No results for input(s): CKTOTAL, CKMB, CKMBINDEX, TROPONINI in the last 168 hours. BNP (last 3 results) No results for input(s): PROBNP in the last 8760 hours. HbA1C: Recent Labs    07/06/19 1630  HGBA1C 7.9*   CBG: Recent Labs  Lab 07/06/19 1732 07/06/19 2324 07/07/19 1156  GLUCAP 121* 300* 311*   Lipid Profile: Recent Labs  07/06/19 1213  TRIG 157*   Thyroid Function Tests: No results for input(s): TSH, T4TOTAL, FREET4, T3FREE, THYROIDAB in the last 72 hours. Anemia Panel: Recent Labs    07/07/19 0651 07/08/19 0420  FERRITIN 231 325*   Sepsis Labs: Recent Labs  Lab 07/06/19 1213 07/06/19 1230 07/08/19 0420  PROCALCITON 0.18  --  0.13  LATICACIDVEN  --  3.2*  --     Recent Results (from the past 240 hour(s))  Novel Coronavirus, NAA ([facility])     Status: Abnormal   Collection Time: 07/06/19 12:00 AM   Specimen: Nasopharyngeal(NP) swabs in vial transport medium   NASOPHARYNGE  TESTING  Result Value Ref Range Status   SARS-CoV-2, NAA Detected (A) Not Detected Final    Comment: This nucleic acid amplification test was developed and its performance characteristics determined by [employer]. Nucleic acid amplification tests include PCR and TMA. This test has not been FDA cleared or approved. This test has been authorized by FDA under an Emergency Use Authorization (EUA). This test is only authorized for the duration of time the declaration that circumstances exist justifying the authorization of the  emergency use of in vitro diagnostic tests for detection of SARS-CoV-2 virus and/or diagnosis of COVID-19 infection under section 564(b)(1) of the Act, 21 U.S.C. [id number]) (1), unless the authorization is terminated or revoked sooner. When diagnostic testing is negative, the possibility of a false negative result should be considered in the context of a patient's recent exposures and the presence of clinical signs and symptoms consistent with COVID-19. An individual without symptoms of COVID-19 and who is not shedding SARS-CoV-2 virus would  expect to have a negative (not detected) result in this assay.   Blood Culture (routine x 2)     Status: None (Preliminary result)   Collection Time: 07/06/19 12:22 PM   Specimen: BLOOD  Result Value Ref Range Status   Specimen Description BLOOD RIGHT ANTECUBITAL  Final   Special Requests   Final    BOTTLES DRAWN AEROBIC AND ANAEROBIC Blood Culture results may not be optimal due to an inadequate volume of blood received in culture bottles   Culture   Final    NO GROWTH 1 DAY Performed at [facility], [place] [address], [place], [place] [address]    Report Status PENDING  Incomplete  Blood Culture (routine x 2)     Status: None (Preliminary result)   Collection Time: 07/06/19 12:22 PM   Specimen: BLOOD RIGHT HAND  Result Value Ref Range Status   Specimen Description BLOOD RIGHT HAND  Final   Special Requests   Final    BOTTLES DRAWN AEROBIC AND ANAEROBIC Blood Culture results may not be optimal due to an inadequate volume of blood received in culture bottles   Culture   Final    NO GROWTH 1 DAY Performed at [facility], [place] [address], [place], [place] [address]    Report Status PENDING  Incomplete         Radiology Studies: DG Chest Port 1 View  Result Date: 07/06/2019 CLINICAL DATA:  Shortness of breath.  Possible COVID pneumonia. EXAM: PORTABLE CHEST 1 VIEW COMPARISON:  Chest x-ray 03/20/2019 FINDINGS: The pacer wires  are stable. The heart is upper limits of normal in size given the AP projection and unchanged. Stable tortuosity and calcification of the thoracic aorta. Underlying emphysematous changes and pulmonary scarring. I do not see any definite infiltrates or effusions. The bony thorax is intact. IMPRESSION: 1. No  acute cardiopulmonary findings. 2. Chronic lung changes with emphysema and pulmonary scarring. Electronically Signed   By: Marijo Sanes M.D.   On: 07/06/2019 12:45        Scheduled Meds: . vitamin C  500 mg Oral Daily  . enoxaparin (LOVENOX) injection  40 mg Subcutaneous Q24H  . fluticasone  2 spray Each Nare Daily  . gabapentin  300 mg Oral TID  . guaiFENesin  1,200 mg Oral BID  . insulin aspart  0-15 Units Subcutaneous TID WC  . insulin aspart  0-5 Units Subcutaneous QHS  . [START ON 07/09/2019] insulin glargine  14 Units Subcutaneous Daily  . ipratropium  2 puff Inhalation Q6H  . levalbuterol  2 puff Inhalation Q6H  . loratadine  10 mg Oral Daily  . methylPREDNISolone (SOLU-MEDROL) injection  0.5 mg/kg Intravenous Q12H  . mometasone-formoterol  2 puff Inhalation BID  . pantoprazole  40 mg Oral Q0600  . pravastatin  20 mg Oral q1800  . zinc sulfate  220 mg Oral Daily   Continuous Infusions: . magnesium sulfate bolus IVPB    . potassium PHOSPHATE IVPB (in mmol) 30 mmol (07/08/19 1018)  . remdesivir 100 mg in NS 100 mL Stopped (07/07/19 1203)     LOS: 2 days    Time spent: 40 minutes    Irine Seal, MD [facility]  If 7PM-7AM, please contact night-coverage [email or web address] 07/08/2019, 10:27 AM

## 2019-07-09 LAB — CBC WITH DIFFERENTIAL/PLATELET
Abs Immature Granulocytes: 0.21 10*3/uL — ABNORMAL HIGH (ref 0.00–0.07)
Basophils Absolute: 0 10*3/uL (ref 0.0–0.1)
Basophils Relative: 0 %
Eosinophils Absolute: 0 10*3/uL (ref 0.0–0.5)
Eosinophils Relative: 0 %
HCT: 37.9 % (ref 36.0–46.0)
Hemoglobin: 12.5 g/dL (ref 12.0–15.0)
Immature Granulocytes: 2 %
Lymphocytes Relative: 14 %
Lymphs Abs: 1.6 10*3/uL (ref 0.7–4.0)
MCH: 27.8 pg (ref 26.0–34.0)
MCHC: 33 g/dL (ref 30.0–36.0)
MCV: 84.4 fL (ref 80.0–100.0)
Monocytes Absolute: 1.1 10*3/uL — ABNORMAL HIGH (ref 0.1–1.0)
Monocytes Relative: 9 %
Neutro Abs: 8.4 10*3/uL — ABNORMAL HIGH (ref 1.7–7.7)
Neutrophils Relative %: 75 %
Platelets: 443 10*3/uL — ABNORMAL HIGH (ref 150–400)
RBC: 4.49 MIL/uL (ref 3.87–5.11)
RDW: 13.7 % (ref 11.5–15.5)
WBC: 11.3 10*3/uL — ABNORMAL HIGH (ref 4.0–10.5)
nRBC: 0 % (ref 0.0–0.2)

## 2019-07-09 LAB — COMPREHENSIVE METABOLIC PANEL
ALT: 22 U/L (ref 0–44)
AST: 24 U/L (ref 15–41)
Albumin: 3.6 g/dL (ref 3.5–5.0)
Alkaline Phosphatase: 55 U/L (ref 38–126)
Anion gap: 12 (ref 5–15)
BUN: 17 mg/dL (ref 8–23)
CO2: 28 mmol/L (ref 22–32)
Calcium: 8.7 mg/dL — ABNORMAL LOW (ref 8.9–10.3)
Chloride: 99 mmol/L (ref 98–111)
Creatinine, Ser: 0.74 mg/dL (ref 0.44–1.00)
GFR calc Af Amer: >60 mL/min (ref >60)
GFR calc non Af Amer: >60 mL/min (ref >60)
Glucose, Bld: 239 mg/dL — ABNORMAL HIGH (ref 70–99)
Potassium: 3.5 mmol/L (ref 3.5–5.1)
Sodium: 139 mmol/L (ref 135–145)
Total Bilirubin: 0.6 mg/dL (ref 0.3–1.2)
Total Protein: 6.6 g/dL (ref 6.5–8.1)

## 2019-07-09 LAB — C-REACTIVE PROTEIN: CRP: 3.8 mg/dL — ABNORMAL HIGH (ref <1.0)

## 2019-07-09 LAB — PROCALCITONIN: Procalcitonin: 0.11 ng/mL

## 2019-07-09 LAB — PHOSPHORUS: Phosphorus: 3.1 mg/dL (ref 2.5–4.6)

## 2019-07-09 LAB — FERRITIN: Ferritin: 421 ng/mL — ABNORMAL HIGH (ref 11–307)

## 2019-07-09 LAB — MAGNESIUM: Magnesium: 2 mg/dL (ref 1.7–2.4)

## 2019-07-09 LAB — D-DIMER, QUANTITATIVE: D-Dimer, Quant: 1.25 ug/mL-FEU — ABNORMAL HIGH (ref 0.00–0.50)

## 2019-07-09 MED ORDER — INSULIN GLARGINE 100 UNIT/ML ~~LOC~~ SOLN
18.0000 [IU] | Freq: Every day | SUBCUTANEOUS | Status: DC
  Administered 2019-07-10: 18 [IU] via SUBCUTANEOUS
  Filled 2019-07-09: qty 0.18

## 2019-07-09 MED ORDER — INSULIN ASPART 100 UNIT/ML ~~LOC~~ SOLN
24.0000 [IU] | Freq: Once | SUBCUTANEOUS | Status: AC
  Administered 2019-07-09: 24 [IU] via SUBCUTANEOUS

## 2019-07-09 MED ORDER — INSULIN GLARGINE 100 UNIT/ML ~~LOC~~ SOLN
4.0000 [IU] | Freq: Once | SUBCUTANEOUS | Status: AC
  Administered 2019-07-09: 4 [IU] via SUBCUTANEOUS
  Filled 2019-07-09: qty 0.04

## 2019-07-09 MED ORDER — INSULIN ASPART 100 UNIT/ML ~~LOC~~ SOLN
0.0000 [IU] | Freq: Every day | SUBCUTANEOUS | Status: DC
  Administered 2019-07-09: 4 [IU] via SUBCUTANEOUS

## 2019-07-09 MED ORDER — INSULIN ASPART 100 UNIT/ML ~~LOC~~ SOLN
0.0000 [IU] | Freq: Three times a day (TID) | SUBCUTANEOUS | Status: DC
  Administered 2019-07-10: 11 [IU] via SUBCUTANEOUS
  Administered 2019-07-10: 15 [IU] via SUBCUTANEOUS

## 2019-07-09 MED ORDER — INSULIN ASPART 100 UNIT/ML ~~LOC~~ SOLN
8.0000 [IU] | Freq: Three times a day (TID) | SUBCUTANEOUS | Status: DC
  Administered 2019-07-10: 8 [IU] via SUBCUTANEOUS

## 2019-07-09 MED ORDER — POTASSIUM CHLORIDE CRYS ER 20 MEQ PO TBCR
40.0000 meq | EXTENDED_RELEASE_TABLET | Freq: Once | ORAL | Status: AC
  Administered 2019-07-09: 40 meq via ORAL
  Filled 2019-07-09: qty 2

## 2019-07-09 NOTE — Progress Notes (Addendum)
PROGRESS NOTE    Erika Leach  [id number] DOB: 1950/12/23 DOA: 07/06/2019 PCP: Antonietta Jewel, MD    Brief Narrative:  HPI per Dr. Twana First Concepcion is a 69 y.o. female with medical history significant of chronic hypoxemic respiratory failure due to underlying COPD-on 2 L of oxygen via nasal cannulae at home as needed, hypertension, hyperlipidemia, diabetes mellitus, diabetic neuropathy, morbid obesity presents to emergency department as she was exposed to her boyfriend who recently tested positive for Covid 2 days ago.  Patient tells me that she has generalized weakness, no energy, decreased appetite, productive cough with whitish-yellow sputum, had diarrhea last week which resolved on its own.  No history of fever, chills, worsening shortness of breath, wheezing, nausea, vomiting, abdominal pain, headache, blurry vision, lightheadedness or dizziness.  She lives with her boyfriend at home and denies smoking, alcohol, street drug use.  ED Course: Upon arrival: Patient's vital signs stable, hypoxic on 84% on room air placed on 3 L of oxygen via nasal cannula.  COVID-19 positive.  CBC shows no leukocytosis.  Patient is afebrile.  BMP shows potassium of 3.2, AKI, inflammatory markers elevated.  Chest x-ray negative for acute findings.  Assessment & Plan:   Principal Problem:   Acute hypoxemic respiratory failure due to COVID-19 [facility]) Active Problems:   Essential hypertension   COPD (chronic obstructive pulmonary disease) (HCC)   Morbid obesity (HCC)   Chronic respiratory failure with hypoxia (HCC)   DM (diabetes mellitus) ([place])   HLD (hyperlipidemia)   Hypokalemia   AKI (acute kidney injury) ([place])   Acute on chronic respiratory failure with hypoxia (HCC)   Hypomagnesemia  1 acute on chronic hypoxemic respiratory failure due to COVID-19 infection Patient had presented noted to be hypoxic with sats of 84% on room air and requiring 3 L nasal cannula more than her baseline.   Patient with history of COPD on chronic home O2 of 2 L at bedtime.  Chest x-ray was negative for any acute infiltrates.  Inflammatory markers elevated.  Afebrile.  Blood cultures pending with no growth to date..  Patient with improvement with shortness of breath. Continue IV Solu-Medrol, IV remdesivir, Xopenex and Atrovent MDIs, Claritin, Flonase, Dulera, Protonix, zinc, vitamin C, Mucinex.  Supportive care.  2.  COPD Stable.  See #1.  3.  Hypokalemia/hypomagnesemia Repleted.  Potassium at 3.5 this morning.  Magnesium at 2. Kdur 40 Meq PO x 1.   4.  Hyperlipidemia Continue statin.  5.  Diabetes mellitus type 2 with neuropathy Elevated CBGs likely secondary to steroids.  Hemoglobin A1c of 7.9.  CBG of 239 on lab work this morning.  Continue Lantus to 14 units daily, meal coverage NovoLog 4 units 3 times daily, sliding scale insulin.  Continue Neurontin.  We will hold home dose oral hypoglycemics inpatient and resume on discharge.  6.  Hypertension Blood pressure somewhat borderline.  Blood pressure improved.  Continue to hold antihypertensive medication.  Follow.   7.  Acute kidney injury Likely secondary to prerenal azotemia in the setting of dehydration, in the setting of diuretic.  Follow.  8.  Morbid obesity  9.  History of complete heart block status post PPM Asymptomatic.  Outpatient follow-up.     DVT prophylaxis: Lovenox Code Status: Full Family Communication: Updated patient.  No family at bedside. Disposition Plan: Likely home when clinically improved with completion of IV remdesivir hopefully tomorrow.   Consultants:   None  Procedures:   Chest x-ray 07/06/2019    Antimicrobials:  IV  remdesivir 07/06/2019  COVID-19 Labs  Recent Labs    07/07/19 0651 07/08/19 0420 07/09/19 0246  DDIMER 1.73* 2.89* 1.25*  FERRITIN 231 325* 421*  CRP 13.8* 7.2* 3.8*    Lab Results  Component Value Date   SARSCOV2NAA Detected (A) 07/06/2019   [place] NEGATIVE  03/20/2019     Subjective: Patient laying in bed.  States she is feeling better.  Denies any chest pain.  Feels shortness of breath is improving.  Still with some generalized weakness.   Objective: Vitals:   07/07/19 2314 07/08/19 0831 07/08/19 2248 07/09/19 0709  BP: (!) 142/64 (!) 143/81 (!) 144/68 120/78  Pulse: 78 76 85 74  Resp:   17 17  Temp: 97.9 F (36.6 C) 98.9 F (37.2 C) 99 F (37.2 C) 98.2 F (36.8 C)  TempSrc: Oral Oral Oral Oral  SpO2: 91% 94% 94% 94%  Weight:        Intake/Output Summary (Last 24 hours) at 07/09/2019 1054 Last data filed at 07/08/2019 1806 Gross per 24 hour  Intake 600 ml  Output --  Net 600 ml   Filed Weights   07/06/19 1400  Weight: 89.8 kg    Examination:  General exam: NAD Respiratory system: Lungs clear to auscultation bilaterally.  Decreased breath sounds in the bases.  Fair air movement.  Speaking in full sentences.  Respiratory effort normal. Cardiovascular system: RRR no murmurs rubs or gallops.  No JVD.  No lower extremity edema.   Gastrointestinal system: Abdomen is soft, nontender, nondistended, positive bowel sounds.  No rebound.  No guarding.  Central nervous system: Alert and oriented. No focal neurological deficits. Extremities: Symmetric 5 x 5 power. Skin: No rashes, lesions or ulcers Psychiatry: Judgement and insight appear normal. Mood & affect appropriate.     Data Reviewed: I have personally reviewed following labs and imaging studies  CBC: Recent Labs  Lab 07/06/19 1213 07/06/19 1630 07/07/19 0651 07/08/19 0420 07/09/19 0246  WBC 6.3 5.5 4.0 9.2 11.3*  NEUTROABS 5.1  --  2.8 7.1 8.4*  HGB 13.0 12.0 12.1 12.6 12.5  HCT 40.3 37.3 37.6 36.8 37.9  MCV 86.7 87.4 86.2 82.7 84.4  PLT 341 311 358 336 [id number]*   Basic Metabolic Panel: Recent Labs  Lab 07/06/19 1213 07/06/19 1630 07/07/19 0651 07/08/19 0420 07/09/19 0246  NA 139  --  138 139 139  K 3.2*  --  3.5 3.9 3.5  CL 98  --  98 101 99  CO2 25  --   25 25 28   GLUCOSE 252*  --  221* 262* 239*  BUN 20  --  21 17 17   CREATININE 1.69* 1.67* 0.86 0.71 0.74  CALCIUM 8.9  --  9.1 9.0 8.7*  MG  --   --  1.5* 1.4* 2.0  PHOS  --   --  3.0 2.3* 3.1   GFR: Estimated Creatinine Clearance: 74.5 mL/min (by C-G formula based on SCr of 0.74 mg/dL). Liver Function Tests: Recent Labs  Lab 07/06/19 1213 07/07/19 0651 07/08/19 0420 07/09/19 0246  AST 29 25 29 24   ALT 18 19 19 22   ALKPHOS 59 52 51 55  BILITOT 0.6 0.7 0.4 0.6  PROT 7.2 6.6 6.9 6.6  ALBUMIN 3.7 3.4* 3.6 3.6   No results for input(s): LIPASE, AMYLASE in the last 168 hours. No results for input(s): AMMONIA in the last 168 hours. Coagulation Profile: No results for input(s): INR, PROTIME in the last 168 hours. Cardiac Enzymes: No results for input(s):  CKTOTAL, CKMB, CKMBINDEX, TROPONINI in the last 168 hours. BNP (last 3 results) No results for input(s): PROBNP in the last 8760 hours. HbA1C: Recent Labs    07/06/19 1630  HGBA1C 7.9*   CBG: Recent Labs  Lab 07/06/19 1732 07/06/19 2324 07/07/19 1156  GLUCAP 121* 300* 311*   Lipid Profile: Recent Labs    07/06/19 1213  TRIG 157*   Thyroid Function Tests: No results for input(s): TSH, T4TOTAL, FREET4, T3FREE, THYROIDAB in the last 72 hours. Anemia Panel: Recent Labs    07/08/19 0420 07/09/19 0246  FERRITIN 325* 421*   Sepsis Labs: Recent Labs  Lab 07/06/19 1213 07/06/19 1230 07/08/19 0420 07/09/19 0246  PROCALCITON 0.18  --  0.13 0.11  LATICACIDVEN  --  3.2*  --   --     Recent Results (from the past 240 hour(s))  Novel Coronavirus, NAA ([facility])     Status: Abnormal   Collection Time: 07/06/19 12:00 AM   Specimen: Nasopharyngeal(NP) swabs in vial transport medium   NASOPHARYNGE  TESTING  Result Value Ref Range Status   SARS-CoV-2, NAA Detected (A) Not Detected Final    Comment: This nucleic acid amplification test was developed and its performance characteristics determined by [employer]. Nucleic acid amplification tests include PCR and TMA. This test has not been FDA cleared or approved. This test has been authorized by FDA under an Emergency Use Authorization (EUA). This test is only authorized for the duration of time the declaration that circumstances exist justifying the authorization of the emergency use of in vitro diagnostic tests for detection of SARS-CoV-2 virus and/or diagnosis of COVID-19 infection under section 564(b)(1) of the Act, 21 U.S.C. GF:7541899) (1), unless the authorization is terminated or revoked sooner. When diagnostic testing is negative, the possibility of a false negative result should be considered in the context of a patient's recent exposures and the presence of clinical signs and symptoms consistent with COVID-19. An individual without symptoms of COVID-19 and who is not shedding SARS-CoV-2 virus would  expect to have a negative (not detected) result in this assay.   Blood Culture (routine x 2)     Status: None (Preliminary result)   Collection Time: 07/06/19 12:22 PM   Specimen: BLOOD  Result Value Ref Range Status   Specimen Description BLOOD RIGHT ANTECUBITAL  Final   Special Requests   Final    BOTTLES DRAWN AEROBIC AND ANAEROBIC Blood Culture results may not be optimal due to an inadequate volume of blood received in culture bottles   Culture   Final    NO GROWTH 2 DAYS Performed at [facility], [place] [address] [place], [place] [address]    Report Status PENDING  Incomplete  Blood Culture (routine x 2)     Status: None (Preliminary result)   Collection Time: 07/06/19 12:22 PM   Specimen: BLOOD RIGHT HAND  Result Value Ref Range Status   Specimen Description BLOOD RIGHT HAND  Final   Special Requests   Final    BOTTLES DRAWN AEROBIC AND ANAEROBIC Blood Culture results may not be optimal due to an inadequate volume of blood received in culture bottles   Culture   Final    NO GROWTH 2 DAYS Performed at [facility], [place] [address] [place],  [address]    Report Status PENDING  Incomplete         Radiology Studies: No results found.      Scheduled Meds: . vitamin C  500 mg Oral Daily  . enoxaparin (LOVENOX) injection  40 mg Subcutaneous Q24H  . fluticasone  2 spray Each Nare Daily  . gabapentin  300 mg Oral TID  . guaiFENesin  1,200 mg Oral BID  . insulin aspart  0-15 Units Subcutaneous TID WC  . insulin aspart  0-5 Units Subcutaneous QHS  . insulin aspart  4 Units Subcutaneous TID WC  . insulin glargine  14 Units Subcutaneous Daily  . ipratropium  2 puff Inhalation Q6H  . levalbuterol  2 puff Inhalation Q6H  . loratadine  10 mg Oral Daily  . methylPREDNISolone (SOLU-MEDROL) injection  0.5 mg/kg Intravenous Q12H  . mometasone-formoterol  2 puff Inhalation BID  . pantoprazole  40 mg Oral Q0600  . pravastatin  20 mg Oral q1800  . zinc sulfate  220 mg Oral Daily   Continuous Infusions: . remdesivir 100 mg in NS 100 mL Stopped (07/08/19 1950)     LOS: 3 days    Time spent: 40 minutes    Irine Seal, MD [facility]  If 7PM-7AM, please contact night-coverage [email or web address] 07/09/2019, 10:54 AM

## 2019-07-10 DIAGNOSIS — E119 Type 2 diabetes mellitus without complications: Secondary | ICD-10-CM

## 2019-07-10 DIAGNOSIS — U071 COVID-19: Secondary | ICD-10-CM

## 2019-07-10 LAB — COMPREHENSIVE METABOLIC PANEL
ALT: 22 U/L (ref 0–44)
AST: 22 U/L (ref 15–41)
Albumin: 3.5 g/dL (ref 3.5–5.0)
Alkaline Phosphatase: 59 U/L (ref 38–126)
Anion gap: 15 (ref 5–15)
BUN: 23 mg/dL (ref 8–23)
CO2: 26 mmol/L (ref 22–32)
Calcium: 9.1 mg/dL (ref 8.9–10.3)
Chloride: 98 mmol/L (ref 98–111)
Creatinine, Ser: 0.88 mg/dL (ref 0.44–1.00)
GFR calc Af Amer: >60 mL/min (ref >60)
GFR calc non Af Amer: >60 mL/min (ref >60)
Glucose, Bld: 221 mg/dL — ABNORMAL HIGH (ref 70–99)
Potassium: 3.9 mmol/L (ref 3.5–5.1)
Sodium: 139 mmol/L (ref 135–145)
Total Bilirubin: 0.7 mg/dL (ref 0.3–1.2)
Total Protein: 6.5 g/dL (ref 6.5–8.1)

## 2019-07-10 LAB — CBC WITH DIFFERENTIAL/PLATELET
Abs Immature Granulocytes: 0.22 10*3/uL — ABNORMAL HIGH (ref 0.00–0.07)
Basophils Absolute: 0 10*3/uL (ref 0.0–0.1)
Basophils Relative: 0 %
Eosinophils Absolute: 0 10*3/uL (ref 0.0–0.5)
Eosinophils Relative: 0 %
HCT: 37.8 % (ref 36.0–46.0)
Hemoglobin: 12.8 g/dL (ref 12.0–15.0)
Immature Granulocytes: 2 %
Lymphocytes Relative: 18 %
Lymphs Abs: 1.8 10*3/uL (ref 0.7–4.0)
MCH: 28.4 pg (ref 26.0–34.0)
MCHC: 33.9 g/dL (ref 30.0–36.0)
MCV: 84 fL (ref 80.0–100.0)
Monocytes Absolute: 1.2 10*3/uL — ABNORMAL HIGH (ref 0.1–1.0)
Monocytes Relative: 12 %
Neutro Abs: 7 10*3/uL (ref 1.7–7.7)
Neutrophils Relative %: 68 %
Platelets: 411 10*3/uL — ABNORMAL HIGH (ref 150–400)
RBC: 4.5 MIL/uL (ref 3.87–5.11)
RDW: 13.7 % (ref 11.5–15.5)
WBC: 10.3 10*3/uL (ref 4.0–10.5)
nRBC: 0 % (ref 0.0–0.2)

## 2019-07-10 LAB — FERRITIN: Ferritin: 474 ng/mL — ABNORMAL HIGH (ref 11–307)

## 2019-07-10 LAB — D-DIMER, QUANTITATIVE: D-Dimer, Quant: 1.03 ug/mL-FEU — ABNORMAL HIGH (ref 0.00–0.50)

## 2019-07-10 LAB — PHOSPHORUS: Phosphorus: 2.2 mg/dL — ABNORMAL LOW (ref 2.5–4.6)

## 2019-07-10 LAB — C-REACTIVE PROTEIN: CRP: 2 mg/dL — ABNORMAL HIGH (ref <1.0)

## 2019-07-10 LAB — MAGNESIUM: Magnesium: 1.9 mg/dL (ref 1.7–2.4)

## 2019-07-10 MED ORDER — FLUTICASONE PROPIONATE 50 MCG/ACT NA SUSP: 2.0000 | Freq: Every day | NASAL | 0 refills | Status: DC

## 2019-07-10 MED ORDER — LORATADINE 10 MG PO TABS: 10.0000 mg | ORAL_TABLET | Freq: Every day | ORAL | 1 refills | Status: DC

## 2019-07-10 MED ORDER — AMLODIPINE BESYLATE 10 MG PO TABS: 10.0000 mg | ORAL_TABLET | Freq: Every day | ORAL | 3 refills | Status: AC

## 2019-07-10 MED ORDER — ZINC SULFATE 220 (50 ZN) MG PO CAPS: 220.0000 mg | ORAL_CAPSULE | Freq: Every day | ORAL | Status: DC

## 2019-07-10 MED ORDER — PREDNISONE 20 MG PO TABS: 20.0000 mg | ORAL_TABLET | Freq: Every day | ORAL | 0 refills | Status: DC

## 2019-07-10 MED ORDER — ASCORBIC ACID 500 MG PO TABS: 500.0000 mg | ORAL_TABLET | Freq: Every day | ORAL | Status: DC

## 2019-07-10 MED ORDER — PANTOPRAZOLE SODIUM 40 MG PO TBEC: 40.0000 mg | DELAYED_RELEASE_TABLET | Freq: Every day | ORAL | 1 refills | Status: DC

## 2019-07-10 MED ORDER — CLONIDINE HCL 0.1 MG PO TABS: 0.1000 mg | ORAL_TABLET | Freq: Two times a day (BID) | ORAL | 0 refills | Status: AC

## 2019-07-10 MED ORDER — GUAIFENESIN ER 600 MG PO TB12: 1200.0000 mg | ORAL_TABLET | Freq: Two times a day (BID) | ORAL | 0 refills | Status: AC

## 2019-07-10 NOTE — TOC Transition Note (Signed)
Transition of Care [facility]) - CM/SW Discharge Note   Patient Details  Name: Erika Leach MRN: [id number] Date of Birth: Jun 26, 1951  Transition of [facility]) CM/SW Contact:  Carles Collet, RN Phone Number: 07/10/2019, 2:42 PM   Clinical Narrative:    Spoke w patient about oxygen set up per MD. She states that she has oxygen through [employer]. She states that her ride will bring tank for transport.     Final next level of care: Home/Self Care     Patient Goals and CMS Choice        Discharge Placement                       Discharge Plan and Services                DME Arranged: Oxygen DME Agency: [employer] Date DME Agency Contacted: 07/10/19 Time DME Agency Contacted: [phone] Representative spoke with at DME Agency: [place] ([place]) Interventions     Readmission Risk Interventions No flowsheet data found.

## 2019-07-10 NOTE — Progress Notes (Addendum)
SATURATION QUALIFICATIONS: (This note is used to comply with regulatory documentation for home oxygen)  Patient Saturations on Room Air at Rest= 92%  Patient Saturations on Room Air while Ambulating = 85%   Patient Saturations on 2 Liters of oxygen while Ambulating = 90%  Please briefly explain why patient needs home oxygen: patient stated she was winded when she moved around, but not short of breath.

## 2019-07-10 NOTE — Discharge Summary (Signed)
Physician Discharge Summary  Ganiya Liechti [id number] DOB: 1950-12-06 DOA: 07/06/2019  PCP: Antonietta Jewel, MD  Admit date: 07/06/2019 Discharge date: 07/10/2019  Time spent: 55 minutes  Recommendations for Outpatient Follow-up:  1. Follow-up with Antonietta Jewel, MD in 2 weeks.  On follow-up patient will need a basic metabolic profile, magnesium done to follow-up on electrolytes and renal function.  Patient need a CBC done to follow-up on H&H.  Patient's Covid status will need to be reassessed.  Patient's blood pressure also need to be reassessed as patient's HCTZ was discontinued on discharge.   Discharge Diagnoses:  Principal Problem:   Acute hypoxemic respiratory failure due to COVID-19 [facility]) Active Problems:   Essential hypertension   COPD (chronic obstructive pulmonary disease) (HCC)   Morbid obesity (HCC)   Chronic respiratory failure with hypoxia (HCC)   DM (diabetes mellitus) ([place])   HLD (hyperlipidemia)   Hypokalemia   AKI (acute kidney injury) ([place])   Acute on chronic respiratory failure with hypoxia ([place])   Hypomagnesemia   Discharge Condition: Stable and improved  Diet recommendation: Carb modified diet  Filed Weights   07/06/19 1400  Weight: 89.8 kg    History of present illness:   Erika Leach is a 69 y.o. female with medical history significant of chronic hypoxemic respiratory failure due to underlying COPD-on 2 L of oxygen via nasal cannulae at home as needed, hypertension, hyperlipidemia, diabetes mellitus, diabetic neuropathy, morbid obesity presented to emergency department as she was exposed to her boyfriend who recently tested positive for Covid 2 days ago.  Patient stated that she has generalized weakness, no energy, decreased appetite, productive cough with whitish-yellow sputum, had diarrhea last week which resolved on its own.  No history of fever, chills, worsening shortness of breath, wheezing, nausea, vomiting, abdominal pain, headache, blurry  vision, lightheadedness or dizziness.  She lives with her boyfriend at home and denies smoking, alcohol, street drug use.  ED Course: Upon arrival: Patient's vital signs stable, hypoxic on 84% on room air placed on 3 L of oxygen via nasal cannula.  COVID-19 positive.  CBC shows no leukocytosis.  Patient is afebrile.  BMP shows potassium of 3.2, AKI, inflammatory markers elevated.  Chest x-ray negative for acute findings.  Hospital Course:  1 acute on chronic hypoxemic respiratory failure due to COVID-19 infection Patient had presented noted to be hypoxic with sats of 84% on room air and requiring 3 L nasal cannula more than her baseline.  Patient with history of COPD on chronic home O2 of 2 L at bedtime.  Chest x-ray was negative for any acute infiltrates.  Inflammatory markers elevated on admission and trended down during the hospitalization.  Patient remained afebrile.  Patient was placed on IV Solu-Medrol, IV remdesivir, Xopenex, Atrovent MDIs, Claritin, Flonase, Dulera, Protonix, zinc, vitamin C, Mucinex.  Patient improved clinically.  Patient completed 5-day course of IV remdesivir.  Patient be discharged home on a prednisone taper as well as vitamin C and zinc.  Outpatient follow-up with PCP.  2.  COPD Stable.  See #1.  3.  Hypokalemia/hypomagnesemia Repleted during the hospitalization.  Outpatient follow-up.  4.  Hyperlipidemia Patient maintained on statin.   5.  Diabetes mellitus type 2 with neuropathy Elevated CBGs likely secondary to steroids.  Hemoglobin A1c of 7.9.    Patient noted to have some elevated blood glucose levels during his hospitalization and was placed on Lantus as well as meal coverage insulin and sliding scale insulin.  Patient also maintained on Neurontin.  Blood  glucose levels were controlled on insulin regimen.  Patient's oral hypoglycemic agents will be resumed on discharge.  Outpatient follow-up.   6.  Hypertension Blood pressure initially was borderline  and antihypertensive medications held.  Patient will be discharged off her HCTZ and other antihypertensive medications will be resumed 3 days post discharge.  Outpatient follow-up with PCP.   7.  Acute kidney injury Likely secondary to prerenal azotemia in the setting of dehydration, in the setting of diuretic.  Patient's diuretics will be discontinued on discharge.  Renal function improved and was back to baseline by day of discharge.  Outpatient follow-up.  8.  Morbid obesity  9.  History of complete heart block status post PPM Asymptomatic.  Outpatient follow-up.     Procedures:  Chest x-ray 07/06/2019    Consultations:  None  Discharge Exam: Vitals:   07/10/19 0600 07/10/19 0917  BP: (!) 154/91 136/86  Pulse:    Resp: 18 18  Temp: 98 F (36.7 C)   SpO2: 94% 96%    General: NAD Cardiovascular: RRR Respiratory: CTAB  Discharge Instructions   Discharge Instructions    Diet Carb Modified   Complete by: As directed    Discharge instructions   Complete by: As directed    ?   Person Under Monitoring Name: Carlise Florio  Location: [place] [address]   Infection Prevention Recommendations for Individuals Confirmed to have, or Being Evaluated for, 2019 Novel Coronavirus (COVID-19) Infection Who Receive Care at Home  Individuals who are confirmed to have, or are being evaluated for, COVID-19 should follow the prevention steps below until a healthcare provider or local or state health department says they can return to normal activities.  Stay home except to get medical care You should restrict activities outside your home, except for getting medical care. Do not go to work, school, or public areas, and do not use public transportation or taxis.  Call ahead before visiting your doctor Before your medical appointment, call the healthcare provider and tell them that you have, or are being evaluated for, COVID-19 infection. This will help  the healthcare provider's office take steps to keep other people from getting infected. Ask your healthcare provider to call the local or state health department.  Monitor your symptoms Seek prompt medical attention if your illness is worsening (e.g., difficulty breathing). Before going to your medical appointment, call the healthcare provider and tell them that you have, or are being evaluated for, COVID-19 infection. Ask your healthcare provider to call the local or state health department.  Wear a facemask You should wear a facemask that covers your nose and mouth when you are in the same room with other people and when you visit a healthcare provider. People who live with or visit you should also wear a facemask while they are in the same room with you.  Separate yourself from other people in your home As much as possible, you should stay in a different room from other people in your home. Also, you should use a separate bathroom, if available.  Avoid sharing household items You should not share dishes, drinking glasses, cups, eating utensils, towels, bedding, or other items with other people in your home. After using these items, you should wash them thoroughly with soap and water.  Cover your coughs and sneezes Cover your mouth and nose with a tissue when you cough or sneeze, or you can cough or sneeze into your sleeve. Throw used tissues in a lined trash  can, and immediately wash your hands with soap and water for at least 20 seconds or use an alcohol-based hand rub.  Wash your [facility] your hands often and thoroughly with soap and water for at least 20 seconds. You can use an alcohol-based hand sanitizer if soap and water are not available and if your hands are not visibly dirty. Avoid touching your eyes, nose, and mouth with unwashed hands.   Prevention Steps for Caregivers and Household Members of Individuals Confirmed to have, or Being Evaluated for, COVID-19 Infection  Being Cared for in the Home  If you live with, or provide care at home for, a person confirmed to have, or being evaluated for, COVID-19 infection please follow these guidelines to prevent infection:  Follow healthcare provider's instructions Make sure that you understand and can help the patient follow any healthcare provider instructions for all care.  Provide for the patient's basic needs You should help the patient with basic needs in the home and provide support for getting groceries, prescriptions, and other personal needs.  Monitor the patient's symptoms If they are getting sicker, call his or her medical provider and tell them that the patient has, or is being evaluated for, COVID-19 infection. This will help the healthcare provider's office take steps to keep other people from getting infected. Ask the healthcare provider to call the local or state health department.  Limit the number of people who have contact with the patient If possible, have only one caregiver for the patient. Other household members should stay in another home or place of residence. If this is not possible, they should stay in another room, or be separated from the patient as much as possible. Use a separate bathroom, if available. Restrict visitors who do not have an essential need to be in the home.  Keep older adults, very young children, and other sick people away from the patient Keep older adults, very young children, and those who have compromised immune systems or chronic health conditions away from the patient. This includes people with chronic heart, lung, or kidney conditions, diabetes, and cancer.  Ensure good ventilation Make sure that shared spaces in the home have good air flow, such as from an air conditioner or an opened window, weather permitting.  Wash your hands often Wash your hands often and thoroughly with soap and water for at least 20 seconds. You can use an alcohol based hand  sanitizer if soap and water are not available and if your hands are not visibly dirty. Avoid touching your eyes, nose, and mouth with unwashed hands. Use disposable paper towels to dry your hands. If not available, use dedicated cloth towels and replace them when they become wet.  Wear a facemask and gloves Wear a disposable facemask at all times in the room and gloves when you touch or have contact with the patient's blood, body fluids, and/or secretions or excretions, such as sweat, saliva, sputum, nasal mucus, vomit, urine, or feces.  Ensure the mask fits over your nose and mouth tightly, and do not touch it during use. Throw out disposable facemasks and gloves after using them. Do not reuse. Wash your hands immediately after removing your facemask and gloves. If your personal clothing becomes contaminated, carefully remove clothing and launder. Wash your hands after handling contaminated clothing. Place all used disposable facemasks, gloves, and other waste in a lined container before disposing them with other household waste. Remove gloves and wash your hands immediately after handling these items.  Do not share dishes, glasses, or other household items with the patient Avoid sharing household items. You should not share dishes, drinking glasses, cups, eating utensils, towels, bedding, or other items with a patient who is confirmed to have, or being evaluated for, COVID-19 infection. After the person uses these items, you should wash them thoroughly with soap and water.  Wash laundry thoroughly Immediately remove and wash clothes or bedding that have blood, body fluids, and/or secretions or excretions, such as sweat, saliva, sputum, nasal mucus, vomit, urine, or feces, on them. Wear gloves when handling laundry from the patient. Read and follow directions on labels of laundry or clothing items and detergent. In general, wash and dry with the warmest temperatures recommended on the  label.  Clean all areas the individual has used often Clean all touchable surfaces, such as counters, tabletops, doorknobs, bathroom fixtures, toilets, phones, keyboards, tablets, and bedside tables, every day. Also, clean any surfaces that may have blood, body fluids, and/or secretions or excretions on them. Wear gloves when cleaning surfaces the patient has come in contact with. Use a diluted bleach solution (e.g., dilute bleach with 1 part bleach and 10 parts water) or a household disinfectant with a label that says EPA-registered for coronaviruses. To make a bleach solution at home, add 1 tablespoon of bleach to 1 quart (4 cups) of water. For a larger supply, add  cup of bleach to 1 gallon (16 cups) of water. Read labels of cleaning products and follow recommendations provided on product labels. Labels contain instructions for safe and effective use of the cleaning product including precautions you should take when applying the product, such as wearing gloves or eye protection and making sure you have good ventilation during use of the product. Remove gloves and wash hands immediately after cleaning.  Monitor yourself for signs and symptoms of illness Caregivers and household members are considered close contacts, should monitor their health, and will be asked to limit movement outside of the home to the extent possible. Follow the monitoring steps for close contacts listed on the symptom monitoring form.   ? If you have additional questions, contact your local health department or call the epidemiologist on call at [phone] (available 24/7). ? This guidance is subject to change. For the most up-to-date guidance from [facility], please refer to their website: [email or web address]   Increase activity slowly   Complete by: As directed      Allergies as of 07/10/2019   No Known Allergies     Medication List    STOP taking these  medications   hydrochlorothiazide 12.5 MG tablet Commonly known as: HYDRODIURIL     TAKE these medications   amLODipine 10 MG tablet Commonly known as: NORVASC Take 1 tablet (10 mg total) by mouth daily. Start taking on: July 13, 2019 What changed: These instructions start on July 13, 2019. If you are unsure what to do until then, ask your doctor or other care provider.   ascorbic acid 500 MG tablet Commonly known as: VITAMIN C Take 1 tablet (500 mg total) by mouth daily. Start taking on: July 11, 2019   bisoprolol 5 MG tablet Commonly known as: ZEBETA Take 1 tablet (5 mg total) by mouth daily.   budesonide-formoterol 80-4.5 MCG/ACT inhaler Commonly known as: Symbicort Take 2 puffs first thing in am and then another 2 puffs about 12 hours later. What changed:   how much to take  how to take this  when to take this  additional instructions  cloNIDine 0.1 MG tablet Commonly known as: CATAPRES Take 1 tablet (0.1 mg total) by mouth 2 (two) times daily. Start taking on: July 13, 2019 What changed: These instructions start on July 13, 2019. If you are unsure what to do until then, ask your doctor or other care provider.   fluticasone 50 MCG/ACT nasal spray Commonly known as: FLONASE Place 2 sprays into both nostrils daily. Start taking on: July 11, 2019   gabapentin 300 MG capsule Commonly known as: NEURONTIN Take 1 capsule by mouth 3 (three) times daily.   glipiZIDE 5 MG tablet Commonly known as: GLUCOTROL Take 5 mg by mouth daily before breakfast.   guaiFENesin 600 MG 12 hr tablet Commonly known as: MUCINEX Take 2 tablets (1,200 mg total) by mouth 2 (two) times daily for 4 days.   loratadine 10 MG tablet Commonly known as: CLARITIN Take 1 tablet (10 mg total) by mouth daily. Start taking on: July 11, 2019   lovastatin 20 MG tablet Commonly known as: MEVACOR Take 20 mg by mouth every morning.   metFORMIN 500 MG tablet Commonly known as:  GLUCOPHAGE Take 500 mg by mouth 2 (two) times daily.   pantoprazole 40 MG tablet Commonly known as: PROTONIX Take 1 tablet (40 mg total) by mouth daily at 6 (six) AM. Start taking on: July 11, 2019   predniSONE 20 MG tablet Commonly known as: DELTASONE Take 1-2 tablets (20-40 mg total) by mouth daily. Take 2 tablets (40mg ) daily x3 days, then 1 tablet (20mg ) daily x3 days then stop   albuterol (2.5 MG/3ML) 0.083% nebulizer solution Commonly known as: PROVENTIL Take 2.5 mg by nebulization every 6 (six) hours as needed for wheezing or shortness of breath.   ProAir HFA 108 (90 Base) MCG/ACT inhaler Generic drug: albuterol Inhale 2 puffs into the lungs every 6 (six) hours as needed for wheezing or shortness of breath.   Spiriva Respimat 2.5 MCG/ACT Aers Generic drug: Tiotropium Bromide Monohydrate Inhale 2 puffs into the lungs daily.   traMADol 50 MG tablet Commonly known as: ULTRAM Take 50-100 mg by mouth daily as needed for moderate pain.   zinc sulfate 220 (50 Zn) MG capsule Take 1 capsule (220 mg total) by mouth daily. Start taking on: July 11, 2019            Durable Medical Equipment  (From admission, onward)         Start     Ordered   07/10/19 1405  For home use only DME oxygen  Once    Question Answer Comment  Length of Need Lifetime   Mode or (Route) Nasal cannula   Liters per Minute 2   Frequency Continuous (stationary and portable oxygen unit needed)   Oxygen conserving device Yes   Oxygen delivery system Gas      07/10/19 1404         No Known Allergies Follow-up Information    Antonietta Jewel, MD. Schedule an appointment as soon as possible for a visit in 2 week(s).   Specialty: Internal Medicine Contact information: [address] [place] [address] [phone]            The results of significant diagnostics from this hospitalization (including imaging, microbiology, ancillary and laboratory) are listed below for  reference.    Significant Diagnostic Studies: DG Chest Port 1 View  Result Date: 07/06/2019 CLINICAL DATA:  Shortness of breath.  Possible COVID pneumonia. EXAM: PORTABLE CHEST 1 VIEW COMPARISON:  Chest x-ray 03/20/2019  FINDINGS: The pacer wires are stable. The heart is upper limits of normal in size given the AP projection and unchanged. Stable tortuosity and calcification of the thoracic aorta. Underlying emphysematous changes and pulmonary scarring. I do not see any definite infiltrates or effusions. The bony thorax is intact. IMPRESSION: 1. No acute cardiopulmonary findings. 2. Chronic lung changes with emphysema and pulmonary scarring. Electronically Signed   By: Marijo Sanes M.D.   On: 07/06/2019 12:45   CUP PACEART REMOTE DEVICE CHECK  Result Date: 06/15/2019 Scheduled remote reviewed.  Normal device function.  AP=70% and histograms flat. May consider rate sensor if appropriate. Next remote 91 days.  [facility]   Microbiology: Recent Results (from the past 240 hour(s))  Novel Coronavirus, NAA ([facility])     Status: Abnormal   Collection Time: 07/06/19 12:00 AM   Specimen: Nasopharyngeal(NP) swabs in vial transport medium   NASOPHARYNGE  TESTING  Result Value Ref Range Status   SARS-CoV-2, NAA Detected (A) Not Detected Final    Comment: This nucleic acid amplification test was developed and its performance characteristics determined by [employer]. Nucleic acid amplification tests include PCR and TMA. This test has not been FDA cleared or approved. This test has been authorized by FDA under an Emergency Use Authorization (EUA). This test is only authorized for the duration of time the declaration that circumstances exist justifying the authorization of the emergency use of in vitro diagnostic tests for detection of SARS-CoV-2 virus and/or diagnosis of COVID-19 infection under section 564(b)(1) of the Act, 21 U.S.C. GF:7541899) (1), unless the authorization is terminated or  revoked sooner. When diagnostic testing is negative, the possibility of a false negative result should be considered in the context of a patient's recent exposures and the presence of clinical signs and symptoms consistent with COVID-19. An individual without symptoms of COVID-19 and who is not shedding SARS-CoV-2 virus would  expect to have a negative (not detected) result in this assay.   Blood Culture (routine x 2)     Status: None (Preliminary result)   Collection Time: 07/06/19 12:22 PM   Specimen: BLOOD  Result Value Ref Range Status   Specimen Description BLOOD RIGHT ANTECUBITAL  Final   Special Requests   Final    BOTTLES DRAWN AEROBIC AND ANAEROBIC Blood Culture results may not be optimal due to an inadequate volume of blood received in culture bottles   Culture   Final    NO GROWTH 4 DAYS Performed at [facility], [place] [address] [place], [place] [address]    Report Status PENDING  Incomplete  Blood Culture (routine x 2)     Status: None (Preliminary result)   Collection Time: 07/06/19 12:22 PM   Specimen: BLOOD RIGHT HAND  Result Value Ref Range Status   Specimen Description BLOOD RIGHT HAND  Final   Special Requests   Final    BOTTLES DRAWN AEROBIC AND ANAEROBIC Blood Culture results may not be optimal due to an inadequate volume of blood received in culture bottles   Culture   Final    NO GROWTH 4 DAYS Performed at [facility], [place] [address], [place], [place] [address]    Report Status PENDING  Incomplete     Labs: Basic Metabolic Panel: Recent Labs  Lab 07/06/19 1213 07/06/19 1630 07/07/19 0651 07/08/19 0420 07/09/19 0246 07/10/19 0324  NA 139  --  138 139 139 139  K 3.2*  --  3.5 3.9 3.5 3.9  CL 98  --  98 101 99 98  CO2 25  --  25 25 28 26   GLUCOSE 252*  --  221* 262* 239* 221*  BUN 20  --  21 17 17 23   CREATININE 1.69* 1.67* 0.86 0.71 0.74 0.88  CALCIUM 8.9  --  9.1 9.0 8.7* 9.1  MG  --   --  1.5* 1.4* 2.0 1.9  PHOS  --   --  3.0  2.3* 3.1 2.2*   Liver Function Tests: Recent Labs  Lab 07/06/19 1213 07/07/19 0651 07/08/19 0420 07/09/19 0246 07/10/19 0324  AST 29 25 29 24 22   ALT 18 19 19 22 22   ALKPHOS 59 52 51 55 59  BILITOT 0.6 0.7 0.4 0.6 0.7  PROT 7.2 6.6 6.9 6.6 6.5  ALBUMIN 3.7 3.4* 3.6 3.6 3.5   No results for input(s): LIPASE, AMYLASE in the last 168 hours. No results for input(s): AMMONIA in the last 168 hours. CBC: Recent Labs  Lab 07/06/19 1213 07/06/19 1630 07/07/19 0651 07/08/19 0420 07/09/19 0246 07/10/19 0324  WBC 6.3 5.5 4.0 9.2 11.3* 10.3  NEUTROABS 5.1  --  2.8 7.1 8.4* 7.0  HGB 13.0 12.0 12.1 12.6 12.5 12.8  HCT 40.3 37.3 37.6 36.8 37.9 37.8  MCV 86.7 87.4 86.2 82.7 84.4 84.0  PLT 341 311 358 336 443* 411*   Cardiac Enzymes: No results for input(s): CKTOTAL, CKMB, CKMBINDEX, TROPONINI in the last 168 hours. BNP: BNP (last 3 results) Recent Labs    03/20/19 1609 07/06/19 1630  BNP 73.1 51.6    ProBNP (last 3 results) No results for input(s): PROBNP in the last 8760 hours.  CBG: Recent Labs  Lab 07/06/19 1732 07/06/19 2324 07/07/19 1156  GLUCAP 121* 300* 311*       Signed:  Irine Seal MD.  [facility] 07/10/2019, 2:30 PM

## 2019-07-10 NOTE — Plan of Care (Signed)
  Problem: Education: Goal: Knowledge of General Education information will improve Description: Including pain rating scale, medication(s)/side effects and non-pharmacologic comfort measures Outcome: Progressing   Problem: Clinical Measurements: Goal: Respiratory complications will improve Outcome: Progressing   Problem: Activity: Goal: Risk for activity intolerance will decrease Outcome: Progressing   

## 2019-07-11 LAB — CULTURE, BLOOD (ROUTINE X 2)
Culture: NO GROWTH
Culture: NO GROWTH

## 2019-07-14 LAB — GLUCOSE, CAPILLARY
Glucose-Capillary: 249 mg/dL — ABNORMAL HIGH (ref 70–99)
Glucose-Capillary: 269 mg/dL — ABNORMAL HIGH (ref 70–99)
Glucose-Capillary: 247 mg/dL — ABNORMAL HIGH (ref 70–99)
Glucose-Capillary: 368 mg/dL — ABNORMAL HIGH (ref 70–99)
Glucose-Capillary: 359 mg/dL — ABNORMAL HIGH (ref 70–99)
Glucose-Capillary: 254 mg/dL — ABNORMAL HIGH (ref 70–99)
Glucose-Capillary: 294 mg/dL — ABNORMAL HIGH (ref 70–99)
Glucose-Capillary: 302 mg/dL — ABNORMAL HIGH (ref 70–99)
Glucose-Capillary: 416 mg/dL — ABNORMAL HIGH (ref 70–99)
Glucose-Capillary: 339 mg/dL — ABNORMAL HIGH (ref 70–99)
Glucose-Capillary: 285 mg/dL — ABNORMAL HIGH (ref 70–99)
Glucose-Capillary: 353 mg/dL — ABNORMAL HIGH (ref 70–99)

## 2019-07-16 NOTE — Progress Notes (Signed)
PPM Remote  

## 2019-09-13 ENCOUNTER — Ambulatory Visit: Payer: Medicare Other | Admitting: Internal Medicine

## 2019-09-13 ENCOUNTER — Other Ambulatory Visit: Payer: Self-pay

## 2019-09-13 ENCOUNTER — Encounter: Payer: Self-pay | Admitting: Primary Care

## 2019-09-13 ENCOUNTER — Ambulatory Visit (INDEPENDENT_AMBULATORY_CARE_PROVIDER_SITE_OTHER): Payer: Medicare Other | Admitting: Primary Care

## 2019-09-13 DIAGNOSIS — J449 Chronic obstructive pulmonary disease, unspecified: Secondary | ICD-10-CM | POA: Diagnosis not present

## 2019-09-13 DIAGNOSIS — B370 Candidal stomatitis: Secondary | ICD-10-CM | POA: Diagnosis not present

## 2019-09-13 MED ORDER — NYSTATIN 100000 UNIT/ML MT SUSP: 5.0000 mL | Freq: Four times a day (QID) | OROMUCOSAL | 1 refills | Status: DC

## 2019-09-13 NOTE — Assessment & Plan Note (Addendum)
-   Stable; no recent exacerbations. Rare SABA use. - STOP Symbicort d/t thrush  - CHANGING Spiriva to Stiolto - take two puffs once daily  - Use albuterol every 6 hours as needed for shortness of breath/wheezing  - Orders: PFTs in 3 months  - Follow-up: 3 months with Dr. Melvyn Novas

## 2019-09-13 NOTE — Progress Notes (Signed)
@Patient  ID: Erika Leach, female    DOB: [birth year], 69 y.o.   MRN: [id number]  Chief Complaint  Patient presents with   Follow-up    Referring provider: Antonietta Jewel, MD    Brief patient profile:  69 year old female Quit smoking 2014 @ wt 165 with h/o childhood asthma outgrew by [facility] with onset breathing problems during pregnancies but no maint rx then worse 2013 /  and on inhalers ever since so referred to pulmonary clinic 11/01/2015 by Dr Sheryle Hail with dx of GOLD III copd 10/4833   HPI: 69 year old female, former smoker quit 2014 (40 pack year hx). PMH significant for COPD GOLD II, OSA, acute on chronic respiratory failure, [id number], diastolic heart failure s/p pacemaker, hypertension, diabetes, obesity. Patient of Dr. Melvyn Novas, last seen on 06/15/19. She was hospitalized in December 2020 for Covid. Maintained on Symbicort 80 and Spiriva. O2 dependent. FU in 3 months. Continue to work on inhaler technique.   09/13/2019 Patient presents today for 3 month follow-up. Accompanied by husband. Breathing is ok. She has a minor dry cough and experiences rare dypnea and wheezing. No recent bronchitis requiring oral antibiotics/steriods. Report thrush symptoms. She stopped taking Symbicort 1 week ago. She has not noticed any noticeable change in her breathing since being off ICS/LABA. She continues to use Spiriva daily. Has not needed to use albuterol hfa/nebulizer. Received both covid vaccines Therapist, music).   Pulmonary function testing: PFT's 10/19/14   FEV1 1.24 (57 % ) ratio 53  p 9 % improvement from saba p ?  prior to study with DLCO  38 % corrects to 52 % for alv volume    No Known Allergies  Immunization History  Administered Date(s) Administered   Influenza, High Dose Seasonal PF 05/21/2018, 04/12/2019   PFIZER SARS-COV-2 Vaccination 08/13/2019, 09/03/2019   Zoster Recombinat (Shingrix) 04/12/2019    Past Medical History:  Diagnosis Date   Asthma    Chest pain 2014   Cath 10/14>>normal  [place]   Complete heart block ([place]) 06/24/2012   HTN (hypertension)    poorly controlled   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    Tobacco History: Social History   Tobacco Use  Smoking Status Former Smoker   Packs/day: 1.00   Years: 40.00   Pack years: 40.00   Types: Cigarettes   Quit date: 07/08/2012   Years since quitting: 7.1  Smokeless Tobacco Never Used   Counseling given: Not Answered   Outpatient Medications Prior to Visit  Medication Sig Dispense Refill   albuterol (PROAIR HFA) 108 (90 BASE) MCG/ACT inhaler Inhale 2 puffs into the lungs every 6 (six) hours as needed for wheezing or shortness of breath.      albuterol (PROVENTIL) (2.5 MG/3ML) 0.083% nebulizer solution Take 2.5 mg by nebulization every 6 (six) hours as needed for wheezing or shortness of breath.      amLODipine (NORVASC) 10 MG tablet Take 1 tablet (10 mg total) by mouth daily.  3   ascorbic acid (VITAMIN C) 500 MG tablet Take 1 tablet (500 mg total) by mouth daily.     bisoprolol (ZEBETA) 5 MG tablet Take 1 tablet (5 mg total) by mouth daily. 30 tablet 11   cloNIDine (CATAPRES) 0.1 MG tablet Take 1 tablet (0.1 mg total) by mouth 2 (two) times daily. 60 tablet 0   gabapentin (NEURONTIN) 300 MG capsule Take 1 capsule by mouth 3 (three) times daily.  11   glipiZIDE (GLUCOTROL) 5 MG tablet Take 5 mg by mouth daily before  breakfast.     lovastatin (MEVACOR) 20 MG tablet Take 20 mg by mouth every morning.      metFORMIN (GLUCOPHAGE) 500 MG tablet Take 500 mg by mouth 2 (two) times daily.  11   pantoprazole (PROTONIX) 40 MG tablet Take 1 tablet (40 mg total) by mouth daily at 6 (six) AM. 30 tablet 1   traMADol (ULTRAM) 50 MG tablet Take 50-100 mg by mouth daily as needed for moderate pain.      budesonide-formoterol (SYMBICORT) 80-4.5 MCG/ACT inhaler Take 2 puffs first thing in am and then another 2 puffs about 12 hours later. (Patient taking differently: Inhale 2 puffs into the lungs 2 (two) times daily.  ) 1 Inhaler 12   Tiotropium Bromide Monohydrate (SPIRIVA RESPIMAT) 2.5 MCG/ACT AERS Inhale 2 puffs into the lungs daily. 4 g 11   fluticasone (FLONASE) 50 MCG/ACT nasal spray Place 2 sprays into both nostrils daily. (Patient not taking: Reported on 09/13/2019) 16 g 0   loratadine (CLARITIN) 10 MG tablet Take 1 tablet (10 mg total) by mouth daily. (Patient not taking: Reported on 09/13/2019) 30 tablet 1   zinc sulfate 220 (50 Zn) MG capsule Take 1 capsule (220 mg total) by mouth daily. (Patient not taking: Reported on 09/13/2019)     predniSONE (DELTASONE) 20 MG tablet Take 1-2 tablets (20-40 mg total) by mouth daily. Take 2 tablets (40mg ) daily x3 days, then 1 tablet (20mg ) daily x3 days then stop (Patient not taking: Reported on 09/13/2019) 9 tablet 0   No facility-administered medications prior to visit.    Review of Systems  Review of Systems  Constitutional: Negative.   Respiratory: Negative for cough, shortness of breath and wheezing.   Cardiovascular: Negative.    Physical Exam  BP 122/74 (BP Location: Right Arm, Cuff Size: Normal)    Pulse 60    Temp 97.7 F (36.5 C) (Temporal)    Ht 5\' 5"  (1.651 m)    Wt 196 lb 12.8 oz (89.3 kg)    LMP  (LMP Unknown)    SpO2 90% Comment: RA   BMI 32.75 kg/m  Physical Exam Constitutional:      Appearance: Normal appearance. She is not ill-appearing.  HENT:     Mouth/Throat:     Mouth: Mucous membranes are moist.     Comments: Fissures to lateral sides tongue, milky white film on posterior tongue  Cardiovascular:     Rate and Rhythm: Normal rate and regular rhythm.  Pulmonary:     Effort: Pulmonary effort is normal.     Breath sounds: Normal breath sounds. No wheezing.     Comments: No wheezing Skin:    General: Skin is warm and dry.  Neurological:     General: No focal deficit present.     Mental Status: She is alert and oriented to person, place, and time. Mental status is at baseline.  Psychiatric:        Mood and Affect: Mood normal.         Behavior: Behavior normal.        Thought Content: Thought content normal.        Judgment: Judgment normal.      Lab Results:  CBC    Component Value Date/Time   WBC 10.3 07/10/2019 0324   RBC 4.50 07/10/2019 0324   HGB 12.8 07/10/2019 0324   HCT 37.8 07/10/2019 0324   PLT 411 (H) 07/10/2019 0324   MCV 84.0 07/10/2019 0324   MCH 28.4 07/10/2019 0324  MCHC 33.9 07/10/2019 0324   RDW 13.7 07/10/2019 0324   LYMPHSABS 1.8 07/10/2019 0324   MONOABS 1.2 (H) 07/10/2019 0324   EOSABS 0.0 07/10/2019 0324   BASOSABS 0.0 07/10/2019 0324    BMET    Component Value Date/Time   NA 139 07/10/2019 0324   NA 141 09/02/2017 1340   K 3.9 07/10/2019 0324   CL 98 07/10/2019 0324   CO2 26 07/10/2019 0324   GLUCOSE 221 (H) 07/10/2019 0324   BUN 23 07/10/2019 0324   BUN 16 09/02/2017 1340   CREATININE 0.88 07/10/2019 0324   CREATININE 0.98 12/08/2015 0955   CALCIUM 9.1 07/10/2019 0324   GFRNONAA >60 07/10/2019 0324   GFRAA >60 07/10/2019 0324    BNP    Component Value Date/Time   BNP 51.6 07/06/2019 1630    ProBNP    Component Value Date/Time   PROBNP 17.0 10/04/2014 1003    Imaging: No results found.   Assessment & Plan:   COPD (chronic obstructive pulmonary disease) ([place]) - Stable; no recent exacerbations. Rare SABA use. - STOP Symbicort d/t thrush  - CHANGING Spiriva to Stiolto - take two puffs once daily  - Use albuterol every 6 hours as needed for shortness of breath/wheezing  - Orders: PFTs in 3 months  - Follow-up: 3 months with Dr. Dolan Amen, oral - Stop ICS, change to LAMA/LABA - [id number] swish and swallow for 10-14 days (if no improvement in thrush let office know)   Martyn Ehrich, NP 09/14/2019

## 2019-09-13 NOTE — Patient Instructions (Addendum)
Recommendations: - STOP Symbicort d/t thrush (if breathing worsen off please let office know) - CHANGING Spiriva to Stiolto - take two puffs once daily  - Use albuterol every 6 hours as needed for shortness of breath/wheezing   RX: -Nystatin swish and swallow for 10-14 days (if no improvement in thrush let office know)  Orders: - PFTs in 3 months   Follow-up: - 3 months with Erika Leach

## 2019-09-14 ENCOUNTER — Ambulatory Visit (INDEPENDENT_AMBULATORY_CARE_PROVIDER_SITE_OTHER): Payer: Medicare Other | Admitting: *Deleted

## 2019-09-14 DIAGNOSIS — I442 Atrioventricular block, complete: Secondary | ICD-10-CM | POA: Diagnosis not present

## 2019-09-14 LAB — CUP PACEART REMOTE DEVICE CHECK
Battery Impedance: 745 Ohm
Battery Remaining Longevity: 68 mo
Battery Voltage: 2.77 V
Brady Statistic AP VP Percent: 68 %
Brady Statistic AP VS Percent: 2 %
Brady Statistic AS VP Percent: 15 %
Brady Statistic AS VS Percent: 16 %
Date Time Interrogation Session: 20210309074641
Implantable Lead Implant Date: 20131218
Implantable Lead Implant Date: 20131218
Implantable Lead Location: 753859
Implantable Lead Location: 753860
Implantable Lead Model: 5076
Implantable Lead Model: 5076
Implantable Pulse Generator Implant Date: 20131218
Lead Channel Impedance Value: 478 Ohm
Lead Channel Impedance Value: 516 Ohm
Lead Channel Pacing Threshold Amplitude: 0.75 V
Lead Channel Pacing Threshold Amplitude: 0.75 V
Lead Channel Pacing Threshold Pulse Width: 0.4 ms
Lead Channel Pacing Threshold Pulse Width: 0.4 ms
Lead Channel Setting Pacing Amplitude: 2 V
Lead Channel Setting Pacing Amplitude: 2.5 V
Lead Channel Setting Pacing Pulse Width: 0.4 ms
Lead Channel Setting Sensing Sensitivity: 2 mV

## 2019-09-14 MED ORDER — STIOLTO RESPIMAT 2.5-2.5 MCG/ACT IN AERS: 2.0000 | INHALATION_SPRAY | Freq: Every day | RESPIRATORY_TRACT | 0 refills | Status: DC

## 2019-09-14 MED ORDER — STIOLTO RESPIMAT 2.5-2.5 MCG/ACT IN AERS: 2.0000 | INHALATION_SPRAY | Freq: Every day | RESPIRATORY_TRACT | 5 refills | Status: DC

## 2019-09-14 NOTE — Addendum Note (Signed)
Addended by: Parke Poisson E on: 09/14/2019 12:01 PM   Modules accepted: Orders

## 2019-09-14 NOTE — Assessment & Plan Note (Signed)
-   Stop ICS, change to LAMA/LABA - [id number] swish and swallow for 10-14 days (if no improvement in thrush let office know)

## 2019-09-15 NOTE — Progress Notes (Signed)
PPM Remote  

## 2019-09-24 ENCOUNTER — Telehealth: Payer: Self-pay | Admitting: Internal Medicine

## 2019-09-24 NOTE — Telephone Encounter (Signed)
Spoke with patient, dropped off jury duty forms today, states she does not feel healthy enough to withstand jury duty, would like MW to write her out of jury duty if possible.  MW please advise if you are ok with writing pt out of jury duty.  Thanks!

## 2019-09-29 ENCOUNTER — Other Ambulatory Visit: Payer: Self-pay | Admitting: Primary Care

## 2019-09-29 NOTE — Telephone Encounter (Signed)
Refill request received. Beth, please advise on this if you are okay with [place] refilling.

## 2019-09-30 NOTE — Telephone Encounter (Signed)
Pt states information needed should be on letter that was left on 3/8.  Please advise.  [phone]

## 2019-09-30 NOTE — Telephone Encounter (Signed)
All the letter states " you must report for jury service on 01/18/20. You will receive a new juror summoms indicating your service date. If you have any questions, you may contact the [employer] clerk via email.    We need her jury number to do the letter, so will need to wait until she gets her new summons that this letter referred to. LMTCB

## 2019-09-30 NOTE — Telephone Encounter (Signed)
I called and spoke with the patient and made her aware that the letter that we received from her does not have her Madaline [place] number on it. She states that she will reach out to the clerk of court and let us know if she can get that number.

## 2019-09-30 NOTE — Telephone Encounter (Signed)
Per Dr. Melvyn Novas he has agreed to give patient a letter for jury duty. Called and spoke with patient asking her for Juror # as well as the date. Patient stated she has to find the letter and will call the office back with the info. Will wait to hear from patient.

## 2019-09-30 NOTE — Telephone Encounter (Signed)
Pt called back -- Pt says she left all the papers with [place], and that the jury number is on those paper.

## 2019-10-07 ENCOUNTER — Encounter: Payer: Self-pay | Admitting: *Deleted

## 2019-10-07 ENCOUNTER — Telehealth: Payer: Self-pay | Admitting: Internal Medicine

## 2019-10-07 NOTE — Telephone Encounter (Signed)
Jury duty letter has been written. LMTCB x1 for pt.

## 2019-10-08 ENCOUNTER — Other Ambulatory Visit: Payer: Self-pay | Admitting: Primary Care

## 2019-10-11 NOTE — Telephone Encounter (Signed)
LMTCB x2 for pt 

## 2019-10-12 NOTE — Telephone Encounter (Signed)
Spoke with pt. She is aware that her letter is ready. Pt would like to pick this up. Letter has been placed up front for pick up. Nothing further needed.

## 2019-11-19 ENCOUNTER — Other Ambulatory Visit: Payer: Self-pay | Admitting: Internal Medicine

## 2019-11-19 DIAGNOSIS — Z1231 Encounter for screening mammogram for malignant neoplasm of breast: Secondary | ICD-10-CM

## 2019-12-09 ENCOUNTER — Other Ambulatory Visit: Payer: Self-pay | Admitting: Internal Medicine

## 2019-12-11 ENCOUNTER — Other Ambulatory Visit (HOSPITAL_COMMUNITY)
Admission: RE | Admit: 2019-12-11 | Discharge: 2019-12-11 | Disposition: A | Discharge status: Home / Self Care | Payer: Medicare Other | Source: Ambulatory Visit | Attending: Internal Medicine | Admitting: Internal Medicine

## 2019-12-11 DIAGNOSIS — Z20822 Contact with and (suspected) exposure to covid-19: Secondary | ICD-10-CM | POA: Insufficient documentation

## 2019-12-11 DIAGNOSIS — Z01812 Encounter for preprocedural laboratory examination: Secondary | ICD-10-CM | POA: Insufficient documentation

## 2019-12-11 LAB — SARS CORONAVIRUS 2 (TAT 6-24 HRS): SARS Coronavirus 2: NEGATIVE

## 2019-12-13 ENCOUNTER — Other Ambulatory Visit: Payer: Self-pay | Admitting: Internal Medicine

## 2019-12-13 DIAGNOSIS — J449 Chronic obstructive pulmonary disease, unspecified: Secondary | ICD-10-CM

## 2019-12-14 ENCOUNTER — Other Ambulatory Visit: Payer: Self-pay

## 2019-12-14 ENCOUNTER — Ambulatory Visit (INDEPENDENT_AMBULATORY_CARE_PROVIDER_SITE_OTHER): Payer: Medicare Other | Admitting: *Deleted

## 2019-12-14 ENCOUNTER — Ambulatory Visit (INDEPENDENT_AMBULATORY_CARE_PROVIDER_SITE_OTHER): Payer: Medicare Other | Admitting: Internal Medicine

## 2019-12-14 ENCOUNTER — Encounter: Payer: Self-pay | Admitting: Internal Medicine

## 2019-12-14 DIAGNOSIS — J449 Chronic obstructive pulmonary disease, unspecified: Secondary | ICD-10-CM | POA: Diagnosis not present

## 2019-12-14 DIAGNOSIS — I442 Atrioventricular block, complete: Secondary | ICD-10-CM | POA: Diagnosis not present

## 2019-12-14 DIAGNOSIS — J9611 Chronic respiratory failure with hypoxia: Secondary | ICD-10-CM

## 2019-12-14 LAB — PULMONARY FUNCTION TEST
DL/VA % pred: 53 %
DL/VA: 2.16 ml/min/mmHg/L
DLCO unc % pred: 39 %
DLCO unc: 8.28 ml/min/mmHg
FEF 25-75 Pre: 0.43 L/sec
FEF2575-%Pred-Pre: 22 %
FEV1-%Pred-Pre: 46 %
FEV1-Pre: 0.96 L
FEV1FVC-%Pred-Pre: 63 %
FEV6-%Pred-Pre: 75 %
FEV6-Pre: 1.92 L
FEV6FVC-%Pred-Pre: 102 %
FVC-%Pred-Pre: 74 %
FVC-Pre: 1.94 L
Pre FEV1/FVC ratio: 49 %
Pre FEV6/FVC Ratio: 99 %
RV % pred: 121 %
RV: 2.76 L
TLC % pred: 90 %
TLC: 4.87 L

## 2019-12-14 LAB — CUP PACEART REMOTE DEVICE CHECK
Battery Impedance: 846 Ohm
Battery Remaining Longevity: 64 mo
Battery Voltage: 2.77 V
Brady Statistic AP VP Percent: 70 %
Brady Statistic AP VS Percent: 2 %
Brady Statistic AS VP Percent: 14 %
Brady Statistic AS VS Percent: 14 %
Date Time Interrogation Session: 20210608100955
Implantable Lead Implant Date: 20131218
Implantable Lead Implant Date: 20131218
Implantable Lead Location: 753859
Implantable Lead Location: 753860
Implantable Lead Model: 5076
Implantable Lead Model: 5076
Implantable Pulse Generator Implant Date: 20131218
Lead Channel Impedance Value: 499 Ohm
Lead Channel Impedance Value: 516 Ohm
Lead Channel Pacing Threshold Amplitude: 0.625 V
Lead Channel Pacing Threshold Amplitude: 0.75 V
Lead Channel Pacing Threshold Pulse Width: 0.4 ms
Lead Channel Pacing Threshold Pulse Width: 0.4 ms
Lead Channel Setting Pacing Amplitude: 2 V
Lead Channel Setting Pacing Amplitude: 2.5 V
Lead Channel Setting Pacing Pulse Width: 0.4 ms
Lead Channel Setting Sensing Sensitivity: 2 mV

## 2019-12-14 MED ORDER — STIOLTO RESPIMAT 2.5-2.5 MCG/ACT IN AERS: 2.0000 | INHALATION_SPRAY | Freq: Every day | RESPIRATORY_TRACT | 0 refills | Status: DC

## 2019-12-14 NOTE — Progress Notes (Signed)
PFT completed today.  

## 2019-12-14 NOTE — Progress Notes (Signed)
Subjective:   Patient ID: Erika Leach, female    DOB: [birth year], 69 y.o.   MRN: [id number]    Brief patient profile:  33  yobf  Quit smoking 2014 @ wt 165 with h/o childhood asthma outgrew by [facility] with onset breathing problems during pregnancies but no maint rx then worse 2013 /  and on inhalers ever since so referred to pulmonary clinic 11/01/2015 by Dr Sheryle Hail with dx of GOLD III copd 10/2014 .  History of Present Illness  11/01/2015 1st [facility] office visit/ Erika Leach   Chief Complaint  Patient presents with  . Pulmonary Consult    Referred by Dr. Antonietta Jewel. Pt c/o SOB x 4 yrs- "all the time" with or without any exertion. She also c/o prod cough with white sputum.    usually no cough first thing then as stir around starts cough/  some coughing also  at bedtime but doesn't keep her up, cough drops work the best/ so severe she gags >  proair helps for a few hours then bad again, no better on qvar so dc'd. Can lose breath sitting with / without cough / gag On Lisinopril since quit smoking and pattern has persisted daily since then even on pred which "just makes her fat, doesn't really help sob or cough" rec Stop lisiopril and start valsartan 160/12.5 one daily and your cough should gradually resolve over several weeks  Only use your albuterol as a rescue medication  GERD    Please schedule a follow up office visit in 4 weeks, sooner if needed to address any and all of  the rest of your respiratory symptoms that don't resolve off lisinopril (it's the only way to sort this out)    11/29/2015  f/u ov/Erika Leach re: acei cough gone/  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 Follow-up    Breathing is unchanged. Her cough is much better. She is using albuterol 1-2 x daily on average.   MMRC2 = can't walk a nl pace on a flat grade s sob but does fine slow and flat eg [employer]  rec Plan A = Automatic = Bevespi Take 2 puffs first thing in am and then another 2  puffs about 12 hours later.  Plan B = Backup Only use your albuterol (proair) as a rescue medication     Admit date: 03/20/2019 Discharge date: 03/24/2019  Admitted From: Home Disposition:  Home   Recommendations for Outpatient Follow-up and new medication changes:  1. Follow up with Dr. Sheryle Hail in 7 days.  2. Patient received 5 days of systemic steroids. 3. Continue bronchodilator at home with beta 2 agonist and inhaled corticosteroid.  4. Will add long acting anticholinergic with tiotropium.  5. Patient likely with chronic hypoxic respiratory failure, oxygen desaturation down to 85% on ambulation.   Home Health: no   Equipment/Devices: Home 02   Discharge Condition: stable  CODE STATUS: full  Diet recommendation: heart healthy and diabetic prudent.   Brief/Interim Summary: 69 year old female who presented with dyspnea. She does have significant past medical history for COPD/asthma, diastolic heart failure, complete heart block status post pacemaker and hypertension. Patient reported 3 days of persistent dyspnea and wheezing. Symptoms were refractive to outpatient therapy with bronchodilators. On her initial physical examination her temperature was 98.5, blood pressure 145/73, heart rate 66, respiratory rate 30, oxygen saturation 97%. She had decreased air entry bilaterally with marked expiratory wheezing, heart S1-S2 present and rhythmic, the abdomen  soft no lower extremity edema. Sodium 142, potassium 3.8, chloride 101, bicarb 30, glucose 96, BUN 15, creatinine 0.95, white count 9.3, hemoglobin 14.8, hematocrit 46.1, platelets 265. SARS COVID-19 was negative. Her chest radiograph had hyperinflation with increased lung markings bilaterally. EKG 60 bpm, atrial and ventricular paced rhythm, no significant ST segment or T wave changes.  Patient was admitted to the hospital with a working diagnosis of COPD exacerbation.  1.  Acute COPD exacerbation with acute on chronic  hypoxic respiratory failure/pulmonary hypertension.  Patient was admitted to the medical ward, she received systemic steroids, aggressive bronchodilators and supplemental oxygen per nasal cannula.  Her symptoms improved, her oxygen saturation is 96% on 1 L per nasal cannula, no significant wheezing or dyspnea.  Patient will be discharged home to continue bronchodilator (albuterol/ symbicort).  Received 5 doses of systemic steroids while hospitalized.  2.  Diastolic heart failure/status post pacer.    remained stable with no signs of exacerbation.  3.  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  Patient received insulin sliding scale along with pre-meal insulin for glucose control.  Her hemoglobin A1c 7.8 consistent with uncontrolled diabetes mellitus. Continue glipizide and metformin.   4.  Hypertension.  Continue blood pressure control with amlodipine, clonidine and  Atenolol.   5.  Morbid obesity with dyslipidemia..  Her calculated BMI is 32.3, she will need outpatient follow-up. Continue lovastatin.    Discharge Diagnoses:  Principal Problem:   COPD exacerbation ([place]) Active Problems:   Essential hypertension   Mild pulmonary hypertension ([facility])   Pacemaker-[employer]   OSA (obstructive sleep apnea)   Morbid obesity ([place])   Acute respiratory failure ([facility])   Hypoxemia    Discharge Instructions   Allergies as of 03/24/2019   No Known Allergies              Medication List        STOP taking these medications       amoxicillin 500 MG capsule Commonly known as: AMOXIL             TAKE these medications       amLODipine 10 MG tablet Commonly known as: NORVASC Take 10 mg by mouth daily.   atenolol 50 MG tablet Commonly known as: TENORMIN Take 50 mg by mouth 2 (two) times daily.   cloNIDine 0.1 MG tablet Commonly known as: CATAPRES Take 0.1 mg by mouth 2 (two) times daily.   gabapentin 300 MG capsule Commonly known as:  NEURONTIN Take 1 capsule by mouth 3 (three) times daily.   glipiZIDE 5 MG tablet Commonly known as: GLUCOTROL Take 5 mg by mouth daily before breakfast.   lovastatin 20 MG tablet Commonly known as: MEVACOR Take 20 mg by mouth at bedtime.   metFORMIN 500 MG tablet Commonly known as: GLUCOPHAGE Take 500 mg by mouth 2 (two) times daily.   albuterol (2.5 MG/3ML) 0.083% nebulizer solution Commonly known as: PROVENTIL Take 2.5 mg by nebulization every 6 (six) hours as needed for wheezing or shortness of breath.   ProAir HFA 108 (90 Base) MCG/ACT inhaler Generic drug: albuterol Inhale 2 puffs into the lungs every 6 (six) hours as needed for wheezing or shortness of breath.   Spiriva HandiHaler 18 MCG inhalation capsule Generic drug: tiotropium Place 1 capsule (18 mcg total) into inhaler and inhale daily.   Symbicort 160-4.5 MCG/ACT inhaler Generic drug: budesonide-formoterol Inhale 2 puffs into the lungs 2 (two) times daily.   traMADol 50 MG tablet Commonly known as:  ULTRAM Take 50-100 mg by mouth daily as needed for pain.       04/01/2019  Erika Leach re: re-establish  GOLD II copd criteria, now 02 dep Cc  Worse sob  While on symbicort for a year or more and did not contact this office Post hosp with aecopd  Chief Complaint  Patient presents with  . Pulmonary Consult    Pt here to re-establish care for COPD. She was seen here last 11/29/2015.  She was admiitted to [facility] recently for COPD exacerbation 03-20-19-03-24-19.  She states her breathing is doing well today as long as she is using her o2. She is using her proair and neb both 4 x per day.   Dyspnea:  Very sedentary since d/c  Cough: better since started on 02  Sleeping: on side bed is flat. One pillow  SABA use: way over using  02: 1 lpm 24/7  rec Plan A = Automatic = Always=    symbicort 160 x 2 puffs and then spiriva x 2 puffs then symbicort 160 x 2 puffs x 12 hour later Work on inhaler technique:  Plan B = Backup  (to supplement plan A, not to replace it) Only use your albuterol inhaler as a rescue medication Plan C = Crisis (instead of Plan B but only if Plan B stops working) - only use your albuterol nebulizer if you first try Plan B Plan D = Deltasone  = if not getting better > Prednisone 10 mg take  4 each am x 2 days,   2 each am x 2 days,  1 each am x 2 days and stop  Stop atenolol and start bisoprolol 5 mg one daily on 04/02/19  Adjust 02 to keep sats > 90%    05/03/2019  f/u ov/Erika Leach re:  Gold II Spirometry  but 02 dep after most recent hosp d/c now maint on symb/spiriva did not need pred but still developed thrush  Chief Complaint  Patient presents with  . Follow-up    Breathing is doing well. She has had cough with clear to cream colored sputum. She is using her proair about 2 x per wk and she has not used neb.    Dyspnea:  Up the street and back s 02 and sats stayed above 90% but not doing this often Cough: none  Sleeping: on side one pillow/ bed flat  SABA use: rarely 02: hs 2lpm and has portable  rec Decrease the symbicort to 80 Take 2 puffs first thing in am and then another 2 puffs about 12 hours later.  Work on inhaler technique:  Diflucan 100mg  daily x 3 days and stop mevacor x 3 days  Goal is to keep 02 sats above 90% at all time - continue to use 2lpm at bedtime when you can't be monitoring your 02 levels   06/15/2019  f/u ov/Erika Leach re: GOLD II/ maint on symb 80/spiriva with limited techinique Chief Complaint  Patient presents with  . Follow-up    Breathing has been worse just since today- she started coughing yesterday- prod with cream color sputum. She is using her albuterol inhaler 2-3 x per day and she has not used her neb.   Dyspnea:  Ok until one day prior to OV  With increaesed cough / congested  Cough: thick creamy esp ina m  Sleeping: fine on side one pillow  SABA use: just started using  one day prior to OV   02: 2lpm hs/ has portable not using  rec Work on  inhaler   12/14/2019  f/u ov/Erika Leach re: GOLD III copd / fully vaccinated for covid 19 No chief complaint on file.  Dyspnea:  Better esp in a/c eg does  shopping fine  Cough: not a lot  Sleeping: fine flat bed / one pillow on side  SABA use: daily  02: only at hs    No obvious day to day or daytime variability or assoc excess/ purulent sputum or mucus plugs or hemoptysis or cp or chest tightness, subjective wheeze or overt sinus or hb symptoms.   Sleeping  without nocturnal  or early am exacerbation  of respiratory  c/o's or need for noct saba. Also denies any obvious fluctuation of symptoms with weather or environmental changes or other aggravating or alleviating factors except as outlined above   No unusual exposure hx or h/o childhood pna/ asthma or knowledge of premature birth.  Current Allergies, Complete Past Medical History, Past Surgical History, Family History, and Social History were reviewed in [employer] record.  ROS  The following are not active complaints unless bolded Hoarseness, sore throat, dysphagia, dental problems, itching, sneezing,  nasal congestion or discharge of excess mucus or purulent secretions, ear ache,   fever, chills, sweats, unintended wt loss or wt gain, classically pleuritic or exertional cp,  orthopnea pnd or arm/hand swelling  or leg swelling, presyncope, palpitations, abdominal pain, anorexia, nausea, vomiting, diarrhea  or change in bowel habits or change in bladder habits, change in stools or change in urine, dysuria, hematuria,  rash, arthralgias, visual complaints, headache, numbness, weakness or ataxia or problems with walking or coordination,  change in mood or  memory.        Current Meds  Medication Sig  . albuterol (PROAIR HFA) 108 (90 BASE) MCG/ACT inhaler Inhale 2 puffs into the lungs every 6 (six) hours as needed for wheezing or shortness of breath.   Marland Kitchen albuterol (PROVENTIL) (2.5 MG/3ML) 0.083% nebulizer solution Take 2.5  mg by nebulization every 6 (six) hours as needed for wheezing or shortness of breath.   Marland Kitchen amLODipine (NORVASC) 10 MG tablet Take 1 tablet (10 mg total) by mouth daily.  Marland Kitchen ascorbic acid (VITAMIN C) 500 MG tablet Take 1 tablet (500 mg total) by mouth daily.  . bisoprolol (ZEBETA) 5 MG tablet TAKE 1 TABLET BY MOUTH EVERY DAY  . cloNIDine (CATAPRES) 0.1 MG tablet Take 1 tablet (0.1 mg total) by mouth 2 (two) times daily.  Marland Kitchen gabapentin (NEURONTIN) 300 MG capsule Take 1 capsule by mouth 3 (three) times daily.  Marland Kitchen glipiZIDE (GLUCOTROL) 5 MG tablet Take 5 mg by mouth daily before breakfast.  . HYDROcodone-acetaminophen (NORCO/VICODIN) 5-325 MG tablet Take 1 tablet by mouth 3 (three) times daily as needed.  . loratadine (CLARITIN) 10 MG tablet Take 1 tablet (10 mg total) by mouth daily.  Marland Kitchen lovastatin (MEVACOR) 20 MG tablet Take 20 mg by mouth every morning.   . metFORMIN (GLUCOPHAGE) 500 MG tablet Take 500 mg by mouth 2 (two) times daily.  . Tiotropium Bromide-Olodaterol (STIOLTO RESPIMAT) 2.5-2.5 MCG/ACT AERS Inhale 2 puffs into the lungs daily.                     Objective:   Physical Exam  amb obese bf nad   12/14/2019         197  06/15/2019       198  05/03/2019     196  04/01/2019  196   11/29/2015       200   11/01/15 200 lb 12.8 oz (91.082 kg)  02/14/15 197 lb (89.359 kg)  11/23/14 193 lb 9.6 oz (87.816 kg)    Vital signs reviewed  12/14/2019  - Note at rest 02 sats  93% on RA      HEENT : pt wearing mask not removed for exam due to covid - 19 concerns.    NECK :  without JVD/Nodes/TM/ nl carotid upstrokes bilaterally   LUNGS: no acc muscle use,  Mild barrel  contour chest wall with bilateral  Distant bs s audible wheeze and  without cough on insp or exp maneuvers  and mild  Hyperresonant  to  percussion bilaterally     CV:  RRR  no s3 or murmur or increase in P2, and no edema   ABD:  Obese soft and nontender with pos end  insp Hoover's  in the supine position. No bruits  or organomegaly appreciated, bowel sounds nl  MS:   Nl gait/  ext warm without deformities, calf tenderness, cyanosis or clubbing No obvious joint restrictions   SKIN: warm and dry without lesions    NEURO:  alert, approp, nl sensorium with  no motor or cerebellar deficits apparent.                Assessment & Plan:

## 2019-12-14 NOTE — Patient Instructions (Addendum)
Make sure you check your oxygen saturations at highest level of activity to be sure it stays over 90% and adjust upward to maintain this level if needed but remember to turn it back to previous settings when you stop (to conserve your supply).    Stay on stiolto  2 pffs each am   Try albuterol 15 min before an activity that you know would make you short of breath and see if it makes any difference and if makes none then don't take it after activity unless you can't catch your breath.   Please schedule a follow up visit in 3 months but call sooner if needed

## 2019-12-15 ENCOUNTER — Encounter: Payer: Self-pay | Admitting: Internal Medicine

## 2019-12-15 NOTE — Assessment & Plan Note (Signed)
New start on outpt 02 03/20/2019 p d/c from admit  04/01/2019 Patient Saturations on Room Air at Rest = 96% >>> Room Air while Ambulating = 88% and on  2 Liters of pulsed oxygen while Ambulating = 93%  Advised: Make sure you check your oxygen saturations at highest level of activity to be sure it stays over 90% and adjust upward to maintain this level if needed but remember to turn it back to previous settings when you stop (to conserve your supply).           Each maintenance medication was reviewed in detail including emphasizing most importantly the difference between maintenance and prns and under what circumstances the prns are to be triggered using an action plan format where appropriate.  Total time for H and P, chart review, counseling, teaching device and generating customized AVS unique to this office visit / charting = 30 min

## 2019-12-15 NOTE — Progress Notes (Signed)
Remote pacemaker transmission.   

## 2019-12-15 NOTE — Assessment & Plan Note (Signed)
Quit smoking 2014  PFT's 10/19/14   FEV1 1.24 (57 % ) ratio 53  p 9 % improvement from saba p ?  prior to study with DLCO  38 % corrects to 52 % for alv volume   - 11/29/2015    try bevespi  - 03/20/2019 admit with aecopd with Eos 1.5  - 04/01/2019    changed spiriva to respimat/ continue symb 160 2bid - 04/01/2019 pred as plan d x 6 days only   - 05/03/19 changed symb to 80 due to thrush  - PFT's  12/14/2019  FEV1 0.96 (46 % ) ratio 0.49  p saba w/in one hour prior to study with DLCO  8.28 (39%) corrects to 2.16 (53%)  for alv volume and FV curve classic concave  - 12/14/2019  After extensive coaching inhaler device,  effectiveness = 75% with smi (short Ti)  Not able to tol ICS so best bet is lama/laba and start regular ex program if not formal pulmonary rehab once covid restrctions dropped.  In meantime: I spent extra time with pt today reviewing appropriate use of albuterol for prn use on exertion with the following points: 1) saba is for relief of sob that does not improve by walking a slower pace or resting but rather if the pt does not improve after trying this first. 2) If the pt is convinced, as many are, that saba helps recover from activity faster then it's easy to tell if this is the case by re-challenging : ie stop, take the inhaler, then p 5 minutes try the exact same activity (intensity of workload) that just caused the symptoms and see if they are substantially diminished or not after saba 3) if there is an activity that reproducibly causes the symptoms, try the saba 15 min before the activity on alternate days   If in fact the saba really does help, then fine to continue to use it prn but advised may need to look closer at the maintenance regimen being used to achieve better control of airways disease with exertion.

## 2019-12-24 ENCOUNTER — Other Ambulatory Visit: Payer: Self-pay

## 2019-12-24 ENCOUNTER — Ambulatory Visit
Admission: RE | Admit: 2019-12-24 | Discharge: 2019-12-24 | Disposition: A | Discharge status: Home / Self Care | Payer: Medicare Other | Source: Ambulatory Visit | Attending: Internal Medicine | Admitting: Internal Medicine

## 2019-12-24 DIAGNOSIS — Z1231 Encounter for screening mammogram for malignant neoplasm of breast: Secondary | ICD-10-CM

## 2020-02-11 ENCOUNTER — Other Ambulatory Visit: Payer: Self-pay | Admitting: Primary Care

## 2020-02-27 ENCOUNTER — Other Ambulatory Visit: Payer: Self-pay | Admitting: Internal Medicine

## 2020-02-29 ENCOUNTER — Telehealth: Payer: Self-pay

## 2020-02-29 NOTE — Telephone Encounter (Signed)
[facility], in relation to PM implanted in 2013. patient does not require pre-medication prior to dental procedures.  Will need to request letter from Dr. Olin Pia nurse if needed.

## 2020-02-29 NOTE — Telephone Encounter (Signed)
Shasta from [facility], she wants to know if the pt is required premedication with the pacemaker. I let her speak with Amy, rn.

## 2020-03-14 ENCOUNTER — Ambulatory Visit (INDEPENDENT_AMBULATORY_CARE_PROVIDER_SITE_OTHER): Payer: Medicare Other | Admitting: *Deleted

## 2020-03-14 DIAGNOSIS — I442 Atrioventricular block, complete: Secondary | ICD-10-CM

## 2020-03-15 LAB — CUP PACEART REMOTE DEVICE CHECK
Battery Impedance: 926 Ohm
Battery Remaining Longevity: 61 mo
Battery Voltage: 2.77 V
Brady Statistic AP VP Percent: 72 %
Brady Statistic AP VS Percent: 2 %
Brady Statistic AS VP Percent: 14 %
Brady Statistic AS VS Percent: 12 %
Date Time Interrogation Session: 20210907125749
Implantable Lead Implant Date: 20131218
Implantable Lead Implant Date: 20131218
Implantable Lead Location: 753859
Implantable Lead Location: 753860
Implantable Lead Model: 5076
Implantable Lead Model: 5076
Implantable Pulse Generator Implant Date: 20131218
Lead Channel Impedance Value: 540 Ohm
Lead Channel Impedance Value: 555 Ohm
Lead Channel Pacing Threshold Amplitude: 0.75 V
Lead Channel Pacing Threshold Amplitude: 0.875 V
Lead Channel Pacing Threshold Pulse Width: 0.4 ms
Lead Channel Pacing Threshold Pulse Width: 0.4 ms
Lead Channel Setting Pacing Amplitude: 2 V
Lead Channel Setting Pacing Amplitude: 2.5 V
Lead Channel Setting Pacing Pulse Width: 0.4 ms
Lead Channel Setting Sensing Sensitivity: 2 mV

## 2020-03-15 NOTE — Progress Notes (Signed)
Remote pacemaker transmission.   

## 2020-03-20 ENCOUNTER — Encounter: Payer: Self-pay | Admitting: Internal Medicine

## 2020-03-20 ENCOUNTER — Ambulatory Visit (INDEPENDENT_AMBULATORY_CARE_PROVIDER_SITE_OTHER): Payer: Medicare Other | Admitting: Internal Medicine

## 2020-03-20 ENCOUNTER — Other Ambulatory Visit: Payer: Self-pay

## 2020-03-20 DIAGNOSIS — J449 Chronic obstructive pulmonary disease, unspecified: Secondary | ICD-10-CM | POA: Diagnosis not present

## 2020-03-20 DIAGNOSIS — J9611 Chronic respiratory failure with hypoxia: Secondary | ICD-10-CM

## 2020-03-20 DIAGNOSIS — B370 Candidal stomatitis: Secondary | ICD-10-CM | POA: Diagnosis not present

## 2020-03-20 MED ORDER — CLOTRIMAZOLE 10 MG MT TROC: 10.0000 mg | Freq: Every day | OROMUCOSAL | 11 refills | Status: DC

## 2020-03-20 MED ORDER — CLOTRIMAZOLE 1 % EX CREA: 1.0000 "application " | TOPICAL_CREAM | Freq: Two times a day (BID) | CUTANEOUS | 11 refills | Status: DC

## 2020-03-20 NOTE — Patient Instructions (Addendum)
Stiolto  1-2 puffs each am   Only use your albuterol as a rescue medication to be used if you can't catch your breath by resting or doing a relaxed purse lip breathing pattern.  - The less you use it, the better it will work when you need it. - Ok to use up to 2 puffs  every 4 hours if you must but call for immediate appointment if use goes up over your usual need - Don't leave home without it !!  (think of it like the spare tire for your car)    Try albuterol 15 min before an activity that you know would make you short of breath and see if it makes any difference and if makes none then don't take it after activity unless you can't catch your breath.  cltrimazole cream  for for lips and and troche for your mouth as needed    Please schedule a follow up visit in 3 months but call sooner if needed

## 2020-03-20 NOTE — Progress Notes (Signed)
Subjective:   Patient ID: Erika Leach, female    DOB: [birth year], 69 y.o.   MRN: [id number]    Brief patient profile:  33  yobf  Quit smoking 2014 @ wt 165 with h/o childhood asthma outgrew by [facility] with onset breathing problems during pregnancies but no maint rx then worse 2013 /  and on inhalers ever since so referred to pulmonary clinic 11/01/2015 by Dr Sheryle Hail with dx of GOLD III copd 10/2014 .  History of Present Illness  11/01/2015 1st [facility] office visit/ Erika Leach   Chief Complaint  Patient presents with  . Pulmonary Consult    Referred by Dr. Antonietta Jewel. Pt c/o SOB x 4 yrs- "all the time" with or without any exertion. She also c/o prod cough with white sputum.    usually no cough first thing then as stir around starts cough/  some coughing also  at bedtime but doesn't keep her up, cough drops work the best/ so severe she gags >  proair helps for a few hours then bad again, no better on qvar so dc'd. Can lose breath sitting with / without cough / gag On Lisinopril since quit smoking and pattern has persisted daily since then even on pred which "just makes her fat, doesn't really help sob or cough" rec Stop lisiopril and start valsartan 160/12.5 one daily and your cough should gradually resolve over several weeks  Only use your albuterol as a rescue medication  GERD    Please schedule a follow up office visit in 4 weeks, sooner if needed to address any and all of  the rest of your respiratory symptoms that don't resolve off lisinopril (it's the only way to sort this out)    11/29/2015  f/u ov/Erika Leach re: acei cough gone/  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 Follow-up    Breathing is unchanged. Her cough is much better. She is using albuterol 1-2 x daily on average.   MMRC2 = can't walk a nl pace on a flat grade s sob but does fine slow and flat eg [employer]  rec Plan A = Automatic = Bevespi Take 2 puffs first thing in am and then another 2  puffs about 12 hours later.  Plan B = Backup Only use your albuterol (proair) as a rescue medication     Admit date: 03/20/2019 Discharge date: 03/24/2019  Admitted From: Home Disposition:  Home   Recommendations for Outpatient Follow-up and new medication changes:  1. Follow up with Dr. Sheryle Hail in 7 days.  2. Patient received 5 days of systemic steroids. 3. Continue bronchodilator at home with beta 2 agonist and inhaled corticosteroid.  4. Will add long acting anticholinergic with tiotropium.  5. Patient likely with chronic hypoxic respiratory failure, oxygen desaturation down to 85% on ambulation.   Home Health: no   Equipment/Devices: Home 02   Discharge Condition: stable  CODE STATUS: full  Diet recommendation: heart healthy and diabetic prudent.   Brief/Interim Summary: 69 year old female who presented with dyspnea. She does have significant past medical history for COPD/asthma, diastolic heart failure, complete heart block status post pacemaker and hypertension. Patient reported 3 days of persistent dyspnea and wheezing. Symptoms were refractive to outpatient therapy with bronchodilators. On her initial physical examination her temperature was 98.5, blood pressure 145/73, heart rate 66, respiratory rate 30, oxygen saturation 97%. She had decreased air entry bilaterally with marked expiratory wheezing, heart S1-S2 present and rhythmic, the abdomen  soft no lower extremity edema. Sodium 142, potassium 3.8, chloride 101, bicarb 30, glucose 96, BUN 15, creatinine 0.95, white count 9.3, hemoglobin 14.8, hematocrit 46.1, platelets 265. SARS COVID-19 was negative. Her chest radiograph had hyperinflation with increased lung markings bilaterally. EKG 60 bpm, atrial and ventricular paced rhythm, no significant ST segment or T wave changes.  Patient was admitted to the hospital with a working diagnosis of COPD exacerbation.  1.  Acute COPD exacerbation with acute on chronic  hypoxic respiratory failure/pulmonary hypertension.  Patient was admitted to the medical ward, she received systemic steroids, aggressive bronchodilators and supplemental oxygen per nasal cannula.  Her symptoms improved, her oxygen saturation is 96% on 1 L per nasal cannula, no significant wheezing or dyspnea.  Patient will be discharged home to continue bronchodilator (albuterol/ symbicort).  Received 5 doses of systemic steroids while hospitalized.  2.  Diastolic heart failure/status post pacer.    remained stable with no signs of exacerbation.  3.  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  Patient received insulin sliding scale along with pre-meal insulin for glucose control.  Her hemoglobin A1c 7.8 consistent with uncontrolled diabetes mellitus. Continue glipizide and metformin.   4.  Hypertension.  Continue blood pressure control with amlodipine, clonidine and  Atenolol.   5.  Morbid obesity with dyslipidemia..  Her calculated BMI is 32.3, she will need outpatient follow-up. Continue lovastatin.    Discharge Diagnoses:  Principal Problem:   COPD exacerbation ([place]) Active Problems:   Essential hypertension   Mild pulmonary hypertension ([facility])   Pacemaker-[employer]   OSA (obstructive sleep apnea)   Morbid obesity ([place])   Acute respiratory failure ([facility])   Hypoxemia    Discharge Instructions   Allergies as of 03/24/2019   No Known Allergies              Medication List        STOP taking these medications       amoxicillin 500 MG capsule Commonly known as: AMOXIL             TAKE these medications       amLODipine 10 MG tablet Commonly known as: NORVASC Take 10 mg by mouth daily.   atenolol 50 MG tablet Commonly known as: TENORMIN Take 50 mg by mouth 2 (two) times daily.   cloNIDine 0.1 MG tablet Commonly known as: CATAPRES Take 0.1 mg by mouth 2 (two) times daily.   gabapentin 300 MG capsule Commonly known as:  NEURONTIN Take 1 capsule by mouth 3 (three) times daily.   glipiZIDE 5 MG tablet Commonly known as: GLUCOTROL Take 5 mg by mouth daily before breakfast.   lovastatin 20 MG tablet Commonly known as: MEVACOR Take 20 mg by mouth at bedtime.   metFORMIN 500 MG tablet Commonly known as: GLUCOPHAGE Take 500 mg by mouth 2 (two) times daily.   albuterol (2.5 MG/3ML) 0.083% nebulizer solution Commonly known as: PROVENTIL Take 2.5 mg by nebulization every 6 (six) hours as needed for wheezing or shortness of breath.   ProAir HFA 108 (90 Base) MCG/ACT inhaler Generic drug: albuterol Inhale 2 puffs into the lungs every 6 (six) hours as needed for wheezing or shortness of breath.   Spiriva HandiHaler 18 MCG inhalation capsule Generic drug: tiotropium Place 1 capsule (18 mcg total) into inhaler and inhale daily.   Symbicort 160-4.5 MCG/ACT inhaler Generic drug: budesonide-formoterol Inhale 2 puffs into the lungs 2 (two) times daily.   traMADol 50 MG tablet Commonly known as:  ULTRAM Take 50-100 mg by mouth daily as needed for pain.       04/01/2019  Erika Leach re: re-establish  GOLD II copd criteria, now 02 dep Cc  Worse sob  While on symbicort for a year or more and did not contact this office Post hosp with aecopd  Chief Complaint  Patient presents with  . Pulmonary Consult    Pt here to re-establish care for COPD. She was seen here last 11/29/2015.  She was admiitted to [facility] recently for COPD exacerbation 03-20-19-03-24-19.  She states her breathing is doing well today as long as she is using her o2. She is using her proair and neb both 4 x per day.   Dyspnea:  Very sedentary since d/c  Cough: better since started on 02  Sleeping: on side bed is flat. One pillow  SABA use: way over using  02: 1 lpm 24/7  rec Plan A = Automatic = Always=    symbicort 160 x 2 puffs and then spiriva x 2 puffs then symbicort 160 x 2 puffs x 12 hour later Work on inhaler technique:  Plan B = Backup  (to supplement plan A, not to replace it) Only use your albuterol inhaler as a rescue medication Plan C = Crisis (instead of Plan B but only if Plan B stops working) - only use your albuterol nebulizer if you first try Plan B Plan D = Deltasone  = if not getting better > Prednisone 10 mg take  4 each am x 2 days,   2 each am x 2 days,  1 each am x 2 days and stop  Stop atenolol and start bisoprolol 5 mg one daily on 04/02/19  Adjust 02 to keep sats > 90%    05/03/2019  f/u ov/Erika Leach re:  Gold II Spirometry  but 02 dep after most recent hosp d/c now maint on symb/spiriva did not need pred but still developed thrush  Chief Complaint  Patient presents with  . Follow-up    Breathing is doing well. She has had cough with clear to cream colored sputum. She is using her proair about 2 x per wk and she has not used neb.    Dyspnea:  Up the street and back s 02 and sats stayed above 90% but not doing this often Cough: none  Sleeping: on side one pillow/ bed flat  SABA use: rarely 02: hs 2lpm and has portable  rec Decrease the symbicort to 80 Take 2 puffs first thing in am and then another 2 puffs about 12 hours later.  Work on inhaler technique:  Diflucan 100mg  daily x 3 days and stop mevacor x 3 days  Goal is to keep 02 sats above 90% at all time - continue to use 2lpm at bedtime when you can't be monitoring your 02 levels   06/15/2019  f/u ov/Erika Leach re: GOLD II/ maint on symb 80/spiriva with limited techinique Chief Complaint  Patient presents with  . Follow-up    Breathing has been worse just since today- she started coughing yesterday- prod with cream color sputum. She is using her albuterol inhaler 2-3 x per day and she has not used her neb.   Dyspnea:  Ok until one day prior to OV  With increaesed cough / congested  Cough: thick creamy esp ina m  Sleeping: fine on side one pillow  SABA use: just started using  one day prior to OV   02: 2lpm hs/ has portable not using  rec Work on  inhaler   12/14/2019  f/u ov/Erika Leach re: GOLD III copd / fully vaccinated for covid 19  Dyspnea:  Better esp in a/c eg does  shopping fine  Cough: not a lot  Sleeping: fine flat bed / one pillow on side  SABA use: daily  02: only at hs  rec Make sure you check your oxygen saturations   Stay on stiolto  2 pffs each am  Try albuterol 15 min before an activity      03/20/2020  f/u ov/Erika Leach re: GOLD II stiolto maint/c/o mouth sore (really means corners of lips) Chief Complaint  Patient presents with  . Follow-up     reports "doing good"; states stiolto is working but she'd like to try something else.  Dyspnea:  Much better now able to do [employer] walking   Cough: none  Sleeping: able to lie flat / one pillow SABA use: maybe twice daily  02: 2lpm hs / never checks daytime    No obvious day to day or daytime variability or assoc excess/ purulent sputum or mucus plugs or hemoptysis or cp or chest tightness, subjective wheeze or overt sinus or hb symptoms.   Sleeping  without nocturnal  or early am exacerbation  of respiratory  c/o's or need for noct saba. Also denies any obvious fluctuation of symptoms with weather or environmental changes or other aggravating or alleviating factors except as outlined above   No unusual exposure hx or h/o childhood pna/ asthma or knowledge of premature birth.  Current Allergies, Complete Past Medical History, Past Surgical History, Family History, and Social History were reviewed in [employer] record.  ROS  The following are not active complaints unless bolded Hoarseness, sore throat, dysphagia, dental problems, itching, sneezing,  nasal congestion or discharge of excess mucus or purulent secretions, ear ache,   fever, chills, sweats, unintended wt loss or wt gain, classically pleuritic or exertional cp,  orthopnea pnd or arm/hand swelling  or leg swelling, presyncope, palpitations, abdominal pain, anorexia, nausea, vomiting, diarrhea   or change in bowel habits or change in bladder habits, change in stools or change in urine, dysuria, hematuria,  rash, arthralgias, visual complaints, headache, numbness, weakness or ataxia or problems with walking or coordination,  change in mood or  Memory.           Current Meds  Medication Sig  . albuterol (PROAIR HFA) 108 (90 BASE) MCG/ACT inhaler Inhale 2 puffs into the lungs every 6 (six) hours as needed for wheezing or shortness of breath.   Marland Kitchen albuterol (PROVENTIL) (2.5 MG/3ML) 0.083% nebulizer solution Take 2.5 mg by nebulization every 6 (six) hours as needed for wheezing or shortness of breath.   Marland Kitchen amLODipine (NORVASC) 10 MG tablet Take 1 tablet (10 mg total) by mouth daily.  Marland Kitchen ascorbic acid (VITAMIN C) 500 MG tablet Take 1 tablet (500 mg total) by mouth daily.  . bisoprolol (ZEBETA) 5 MG tablet TAKE 1 TABLET BY MOUTH EVERY DAY  . cloNIDine (CATAPRES) 0.1 MG tablet Take 1 tablet (0.1 mg total) by mouth 2 (two) times daily.  Marland Kitchen gabapentin (NEURONTIN) 300 MG capsule Take 1 capsule by mouth 3 (three) times daily.  Marland Kitchen glipiZIDE (GLUCOTROL) 5 MG tablet Take 5 mg by mouth daily before breakfast.  . HYDROcodone-acetaminophen (NORCO/VICODIN) 5-325 MG tablet Take 1 tablet by mouth 3 (three) times daily as needed.  . loratadine (CLARITIN) 10 MG tablet Take 1 tablet (10 mg total) by mouth daily.  Marland Kitchen  lovastatin (MEVACOR) 20 MG tablet Take 20 mg by mouth every morning.   . metFORMIN (GLUCOPHAGE) 500 MG tablet Take 500 mg by mouth 2 (two) times daily.  Marland Kitchen STIOLTO RESPIMAT 2.5-2.5 MCG/ACT AERS INHALE 2 PUFFS BY MOUTH INTO THE LUNGS DAILY  . Tiotropium Bromide-Olodaterol (STIOLTO RESPIMAT) 2.5-2.5 MCG/ACT AERS Inhale 2 puffs into the lungs daily.                      Objective:   Physical Exam  amb pleasant obese bf nad   03/20/2020       188  12/14/2019         197  06/15/2019       198  05/03/2019     196  04/01/2019       196   11/29/2015       200   11/01/15 200 lb 12.8 oz (91.082 kg)   02/14/15 197 lb (89.359 kg)  11/23/14 193 lb 9.6 oz (87.816 kg)    Vital signs reviewed  03/20/2020  - Note at rest 02 sats  96% on RA     HEENT : minimal perleche/ minimal oral thrush     NECK :  without JVD/Nodes/TM/ nl carotid upstrokes bilaterally   LUNGS: no acc muscle use,  Mild barrel  contour chest wall with bilateral  Distant bs s audible wheeze and  without cough on insp or exp maneuvers  and mild  Hyperresonant  to  percussion bilaterally     CV:  RRR  no s3 or murmur or increase in P2, and no edema   ABD: obese soft and nontender with pos end  insp Hoover's  in the supine position. No bruits or organomegaly appreciated, bowel sounds nl  MS:   Nl gait/  ext warm without deformities, calf tenderness, cyanosis or clubbing No obvious joint restrictions   SKIN: warm and dry without lesions    NEURO:  alert, approp, nl sensorium with  no motor or cerebellar deficits apparent.                 Assessment & Plan:

## 2020-03-21 ENCOUNTER — Encounter: Payer: Self-pay | Admitting: Internal Medicine

## 2020-03-21 NOTE — Assessment & Plan Note (Signed)
New start on outpt 02 03/20/2019 p d/c from admit  04/01/2019 Patient Saturations on Room Air at Rest = 96% >>> Room Air while Ambulating = 88% and on  2 Liters of pulsed oxygen while Ambulating = 93%  As of 03/20/2020 rec 2llpm hs and advised:  Make sure you check your oxygen saturations at highest level of activity to be sure it stays over 90% and adjust upward to maintain this level if needed but remember to turn it back to previous settings when you stop (to conserve your supply).           Each maintenance medication was reviewed in detail including emphasizing most importantly the difference between maintenance and prns and under what circumstances the prns are to be triggered using an action plan format where appropriate.  Total time for H and P, chart review, counseling, teaching device and generating customized AVS unique to this office visit / charting = 20 min

## 2020-03-21 NOTE — Assessment & Plan Note (Signed)
Continue prn clotrimazole to lips/ mouth  > since not on any ICS concerned re glucose control > referred back to PCP

## 2020-03-21 NOTE — Assessment & Plan Note (Signed)
Quit smoking 2014  PFT's 10/19/14   FEV1 1.24 (57 % ) ratio 53  p 9 % improvement from saba p ?  prior to study with DLCO  38 % corrects to 52 % for alv volume   - 11/29/2015    try bevespi  - 03/20/2019 admit with aecopd with Eos 1.5  - 04/01/2019    changed spiriva to respimat/ continue symb 160 2bid - 04/01/2019 pred as plan d x 6 days only   - 05/03/19 changed symb to 80 due to thrush  - PFT's  12/14/2019  FEV1 0.96 (46 % ) ratio 0.49  p saba w/in one hour prior to study with DLCO  8.28 (39%) corrects to 2.16 (53%)  for alv volume and FV curve classic concave  - 03/20/2020  After extensive coaching inhaler device,  effectiveness =    75% smi from baseline of less than 50% > continue stiolto but use 1 puff each am instead of 2 (strongly doubt it's causing oral candidiasis)    Pt is Group B in terms of symptom/risk and laba/lama therefore appropriate rx at this point >>>  Try stiolto just one puff each am performed correctly  As was only getting half of it in anyway  Re saba: I spent extra time with pt today reviewing appropriate use of albuterol for prn use on exertion with the following points: 1) saba is for relief of sob that does not improve by walking a slower pace or resting but rather if the pt does not improve after trying this first. 2) If the pt is convinced, as many are, that saba helps recover from activity faster then it's easy to tell if this is the case by re-challenging : ie stop, take the inhaler, then p 5 minutes try the exact same activity (intensity of workload) that just caused the symptoms and see if they are substantially diminished or not after saba 3) if there is an activity that reproducibly causes the symptoms, try the saba 15 min before the activity on alternate days   If in fact the saba really does help, then fine to continue to use it prn but advised may need to look closer at the maintenance regimen being used to achieve better control of airways disease with exertion.

## 2020-04-10 ENCOUNTER — Other Ambulatory Visit: Payer: Self-pay | Admitting: Internal Medicine

## 2020-04-18 ENCOUNTER — Telehealth: Payer: Self-pay | Admitting: Internal Medicine

## 2020-04-18 NOTE — Telephone Encounter (Signed)
done

## 2020-04-18 NOTE — Telephone Encounter (Signed)
Yes ok for permanent placard

## 2020-04-18 NOTE — Telephone Encounter (Signed)
Dr Melvyn Novas- are you okay with signing handicap placard for this pt? Please advise, thanks!

## 2020-04-18 NOTE — Telephone Encounter (Signed)
Form filled out and placed in A pod for MW to sign  Thanks

## 2020-04-19 NOTE — Telephone Encounter (Signed)
Spoke with the pt's spouse and notified that the form was signed  He wants to pick up so I have placed up front  Nothing further needed

## 2020-04-20 ENCOUNTER — Other Ambulatory Visit: Payer: Self-pay | Admitting: Internal Medicine

## 2020-05-03 ENCOUNTER — Ambulatory Visit: Payer: Medicare Other | Attending: Internal Medicine

## 2020-05-03 ENCOUNTER — Other Ambulatory Visit (HOSPITAL_COMMUNITY): Payer: Self-pay | Admitting: Internal Medicine

## 2020-05-03 DIAGNOSIS — Z23 Encounter for immunization: Secondary | ICD-10-CM

## 2020-05-03 NOTE — Progress Notes (Signed)
   Covid-19 Vaccination Clinic  Name:  Nekita Pita    MRN: [id number] DOB: May 01, 1951  05/03/2020  Ms. Zendejas was observed post Covid-19 immunization for 15 minutes without incident. She was provided with Vaccine Information Sheet and instruction to access the V-Safe system.   Ms. Urias was instructed to call 911 with any severe reactions post vaccine: Marland Kitchen Difficulty breathing  . Swelling of face and throat  . A fast heartbeat  . A bad rash all over body  . Dizziness and weakness

## 2020-06-13 ENCOUNTER — Ambulatory Visit (INDEPENDENT_AMBULATORY_CARE_PROVIDER_SITE_OTHER): Payer: Medicare Other

## 2020-06-13 DIAGNOSIS — I442 Atrioventricular block, complete: Secondary | ICD-10-CM

## 2020-06-16 LAB — CUP PACEART REMOTE DEVICE CHECK
Battery Impedance: 1029 Ohm
Battery Remaining Longevity: 57 mo
Battery Voltage: 2.76 V
Brady Statistic AP VP Percent: 72 %
Brady Statistic AP VS Percent: 1 %
Brady Statistic AS VP Percent: 15 %
Brady Statistic AS VS Percent: 11 %
Date Time Interrogation Session: 20211209113109
Implantable Lead Implant Date: 20131218
Implantable Lead Implant Date: 20131218
Implantable Lead Location: 753859
Implantable Lead Location: 753860
Implantable Lead Model: 5076
Implantable Lead Model: 5076
Implantable Pulse Generator Implant Date: 20131218
Lead Channel Impedance Value: 513 Ohm
Lead Channel Impedance Value: 516 Ohm
Lead Channel Pacing Threshold Amplitude: 0.75 V
Lead Channel Pacing Threshold Amplitude: 0.75 V
Lead Channel Pacing Threshold Pulse Width: 0.4 ms
Lead Channel Pacing Threshold Pulse Width: 0.4 ms
Lead Channel Setting Pacing Amplitude: 2 V
Lead Channel Setting Pacing Amplitude: 2.5 V
Lead Channel Setting Pacing Pulse Width: 0.4 ms
Lead Channel Setting Sensing Sensitivity: 2 mV

## 2020-06-19 ENCOUNTER — Ambulatory Visit: Payer: Medicaid Other | Admitting: Internal Medicine

## 2020-06-23 NOTE — Progress Notes (Signed)
Remote pacemaker transmission.   

## 2020-06-26 ENCOUNTER — Ambulatory Visit: Payer: Medicaid Other | Admitting: Internal Medicine

## 2020-07-10 ENCOUNTER — Ambulatory Visit (INDEPENDENT_AMBULATORY_CARE_PROVIDER_SITE_OTHER): Payer: Medicare Other | Admitting: Internal Medicine

## 2020-07-10 ENCOUNTER — Encounter: Payer: Self-pay | Admitting: Internal Medicine

## 2020-07-10 ENCOUNTER — Other Ambulatory Visit: Payer: Self-pay

## 2020-07-10 DIAGNOSIS — J449 Chronic obstructive pulmonary disease, unspecified: Secondary | ICD-10-CM

## 2020-07-10 DIAGNOSIS — J9611 Chronic respiratory failure with hypoxia: Secondary | ICD-10-CM

## 2020-07-10 DIAGNOSIS — I10 Essential (primary) hypertension: Secondary | ICD-10-CM | POA: Diagnosis not present

## 2020-07-10 MED ORDER — PREDNISONE 10 MG PO TABS: ORAL_TABLET | ORAL | 3 refills | Status: DC

## 2020-07-10 MED ORDER — FAMOTIDINE 20 MG PO TABS: ORAL_TABLET | ORAL | 11 refills | Status: DC

## 2020-07-10 MED ORDER — PANTOPRAZOLE SODIUM 40 MG PO TBEC: 40.0000 mg | DELAYED_RELEASE_TABLET | Freq: Every day | ORAL | 2 refills | Status: DC

## 2020-07-10 NOTE — Assessment & Plan Note (Signed)
Quit smoking 2014  PFT's 10/19/14   FEV1 1.24 (57 % ) ratio 53  p 9 % improvement from saba p ?  prior to study with DLCO  38 % corrects to 52 % for alv volume   - 11/29/2015    try bevespi  - 03/20/2019 admit with aecopd with Eos 1.5  - 04/01/2019    changed spiriva to respimat/ continue symb 160 2bid - 04/01/2019 pred as plan d x 6 days only   - 05/03/19 changed symb to 80 due to thrush  - PFT's  12/14/2019  FEV1 0.96 (46 % ) ratio 0.49  p saba w/in one hour prior to study with DLCO  8.28 (39%) corrects to 2.16 (53%)  for alv volume and FV curve classic concave  - 03/20/2020  After extensive coaching inhaler device,  effectiveness =    75% smi > continue stiolto but use 1 puff each am instead of 2 (strongly doubt it's causing oral candidiasis)  -07/10/2020  refilled prednisone x 6 days cycles only and added gerd rx   Group D in terms of symptom/risk and laba/lama/ICS  therefore appropriate rx at this point >>>  stiolto plus prn prednisone for now but ? Why so difficult to control   ? Allergic component >  6 d prednisone as "plan D" for now  ? Acid (or non-acid) GERD > always difficult to exclude as up to 75% of pts in some series report no assoc GI/ Heartburn symptoms> rec max (24h)  acid suppression and diet restrictions/ reviewed and instructions given in writing.   ? Adherence:  - The proper method of use, as well as anticipated side effects, of a soft mist  inhaler were discussed and demonstrated to the patient using teach back method.

## 2020-07-10 NOTE — Patient Instructions (Addendum)
Plan A = Automatic = Always=    Stiolto 2 pffs first thing in am   Plan B = Backup (to supplement plan A, not to replace it) Only use your albuterol inhaler as a rescue medication to be used if you can't catch your breath by resting or doing a relaxed purse lip breathing pattern.  - The less you use it, the better it will work when you need it. - Ok to use the inhaler up to 2 puffs  every 4 hours if you must but call for appointment if use goes up over your usual need - Don't leave home without it !!  (think of it like the spare tire for your car)   Plan C = Crisis (instead of Plan B but only if Plan B stops working) - only use your albuterol nebulizer if you first try Plan B and it fails to help > ok to use the nebulizer up to every 4 hours but if start needing it regularly call for immediate appointment   Plan D = Deltasone  = if not getting better > Prednisone 10 mg take  4 each am x 2 days,   2 each am x 2 days,  1 each am x 2 days and stop    Plan E = ER if all else fails   For cough > mucinex dm 1200  Mg twice daily    Pantoprazole (protonix) 40 mg   Take  30-60 min before first meal of the day and Pepcid (famotidine)  20 mg one after supper for at  Least 6 weeks  GERD (REFLUX)  is an extremely common cause of respiratory symptoms just like yours , many times with no obvious heartburn at all.    It can be treated with medication, but also with lifestyle changes including elevation of the head of your bed (ideally with 6-8inch blocks under the headboard of your bed),  Smoking cessation, avoidance of late meals, excessive alcohol, and avoid fatty foods, chocolate, peppermint, colas, red wine, and acidic juices such as orange juice.  NO MINT OR MENTHOL PRODUCTS SO NO COUGH DROPS  USE SUGARLESS CANDY INSTEAD (Jolley ranchers or Stover's or Life Savers) or even ice chips will also do - the key is to swallow to prevent all throat clearing. NO OIL BASED VITAMINS - use powdered substitutes.   Avoid fish oil when coughing.    Please schedule a follow up visit in 3 months but call sooner if needed

## 2020-07-10 NOTE — Progress Notes (Signed)
Subjective:   Patient ID: Erika Leach, female    DOB: [birth year], 70 y.o.   MRN: [id number]    Brief patient profile:  33  yobf  Quit smoking 2014 @ wt 165 with h/o childhood asthma outgrew by [facility] with onset breathing problems during pregnancies but no maint rx then worse 2013 /  and on inhalers ever since so referred to pulmonary clinic 11/01/2015 by Dr Sheryle Hail with dx of GOLD III copd 10/2014 .  History of Present Illness  11/01/2015 1st [facility] office visit/ Erika Leach   Chief Complaint  Patient presents with  . Pulmonary Consult    Referred by Dr. Antonietta Jewel. Pt c/o SOB x 4 yrs- "all the time" with or without any exertion. She also c/o prod cough with white sputum.    usually no cough first thing then as stir around starts cough/  some coughing also  at bedtime but doesn't keep her up, cough drops work the best/ so severe she gags >  proair helps for a few hours then bad again, no better on qvar so dc'd. Can lose breath sitting with / without cough / gag On Lisinopril since quit smoking and pattern has persisted daily since then even on pred which "just makes her fat, doesn't really help sob or cough" rec Stop lisiopril and start valsartan 160/12.5 one daily and your cough should gradually resolve over several weeks  Only use your albuterol as a rescue medication  GERD    Please schedule a follow up office visit in 4 weeks, sooner if needed to address any and all of  the rest of your respiratory symptoms that don't resolve off lisinopril (it's the only way to sort this out)    11/29/2015  f/u ov/Erika Leach re: acei cough gone/  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 Follow-up    Breathing is unchanged. Her cough is much better. She is using albuterol 1-2 x daily on average.   MMRC2 = can't walk a nl pace on a flat grade s sob but does fine slow and flat eg [employer]  rec Plan A = Automatic = Bevespi Take 2 puffs first thing in am and then another 2  puffs about 12 hours later.  Plan B = Backup Only use your albuterol (proair) as a rescue medication     Admit date: 03/20/2019 Discharge date: 03/24/2019  Admitted From: Home Disposition:  Home   Recommendations for Outpatient Follow-up and new medication changes:  1. Follow up with Dr. Sheryle Hail in 7 days.  2. Patient received 5 days of systemic steroids. 3. Continue bronchodilator at home with beta 2 agonist and inhaled corticosteroid.  4. Will add long acting anticholinergic with tiotropium.  5. Patient likely with chronic hypoxic respiratory failure, oxygen desaturation down to 85% on ambulation.   Home Health: no   Equipment/Devices: Home 02   Discharge Condition: stable  CODE STATUS: full  Diet recommendation: heart healthy and diabetic prudent.   Brief/Interim Summary: 70 year old female who presented with dyspnea. She does have significant past medical history for COPD/asthma, diastolic heart failure, complete heart block status post pacemaker and hypertension. Patient reported 3 days of persistent dyspnea and wheezing. Symptoms were refractive to outpatient therapy with bronchodilators. On her initial physical examination her temperature was 98.5, blood pressure 145/73, heart rate 66, respiratory rate 30, oxygen saturation 97%. She had decreased air entry bilaterally with marked expiratory wheezing, heart S1-S2 present and rhythmic, the abdomen  soft no lower extremity edema. Sodium 142, potassium 3.8, chloride 101, bicarb 30, glucose 96, BUN 15, creatinine 0.95, white count 9.3, hemoglobin 14.8, hematocrit 46.1, platelets 265. SARS COVID-19 was negative. Her chest radiograph had hyperinflation with increased lung markings bilaterally. EKG 60 bpm, atrial and ventricular paced rhythm, no significant ST segment or T wave changes.  Patient was admitted to the hospital with a working diagnosis of COPD exacerbation.  1.  Acute COPD exacerbation with acute on chronic  hypoxic respiratory failure/pulmonary hypertension.  Patient was admitted to the medical ward, she received systemic steroids, aggressive bronchodilators and supplemental oxygen per nasal cannula.  Her symptoms improved, her oxygen saturation is 96% on 1 L per nasal cannula, no significant wheezing or dyspnea.  Patient will be discharged home to continue bronchodilator (albuterol/ symbicort).  Received 5 doses of systemic steroids while hospitalized.  2.  Diastolic heart failure/status post pacer.    remained stable with no signs of exacerbation.  3.  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  Patient received insulin sliding scale along with pre-meal insulin for glucose control.  Her hemoglobin A1c 7.8 consistent with uncontrolled diabetes mellitus. Continue glipizide and metformin.   4.  Hypertension.  Continue blood pressure control with amlodipine, clonidine and  Atenolol.   5.  Morbid obesity with dyslipidemia..  Her calculated BMI is 32.3, she will need outpatient follow-up. Continue lovastatin.    Discharge Diagnoses:  Principal Problem:   COPD exacerbation ([place]) Active Problems:   Essential hypertension   Mild pulmonary hypertension ([facility])   Pacemaker-[employer]   OSA (obstructive sleep apnea)   Morbid obesity ([place])   Acute respiratory failure ([facility])   Hypoxemia    Discharge Instructions   Allergies as of 03/24/2019   No Known Allergies              Medication List        STOP taking these medications       amoxicillin 500 MG capsule Commonly known as: AMOXIL             TAKE these medications       amLODipine 10 MG tablet Commonly known as: NORVASC Take 10 mg by mouth daily.   atenolol 50 MG tablet Commonly known as: TENORMIN Take 50 mg by mouth 2 (two) times daily.   cloNIDine 0.1 MG tablet Commonly known as: CATAPRES Take 0.1 mg by mouth 2 (two) times daily.   gabapentin 300 MG capsule Commonly known as:  NEURONTIN Take 1 capsule by mouth 3 (three) times daily.   glipiZIDE 5 MG tablet Commonly known as: GLUCOTROL Take 5 mg by mouth daily before breakfast.   lovastatin 20 MG tablet Commonly known as: MEVACOR Take 20 mg by mouth at bedtime.   metFORMIN 500 MG tablet Commonly known as: GLUCOPHAGE Take 500 mg by mouth 2 (two) times daily.   albuterol (2.5 MG/3ML) 0.083% nebulizer solution Commonly known as: PROVENTIL Take 2.5 mg by nebulization every 6 (six) hours as needed for wheezing or shortness of breath.   ProAir HFA 108 (90 Base) MCG/ACT inhaler Generic drug: albuterol Inhale 2 puffs into the lungs every 6 (six) hours as needed for wheezing or shortness of breath.   Spiriva HandiHaler 18 MCG inhalation capsule Generic drug: tiotropium Place 1 capsule (18 mcg total) into inhaler and inhale daily.   Symbicort 160-4.5 MCG/ACT inhaler Generic drug: budesonide-formoterol Inhale 2 puffs into the lungs 2 (two) times daily.   traMADol 50 MG tablet Commonly known as:  ULTRAM Take 50-100 mg by mouth daily as needed for pain.       04/01/2019  Erika Leach re: re-establish  GOLD II copd criteria, now 02 dep Cc  Worse sob  While on symbicort for a year or more and did not contact this office Post hosp with aecopd  Chief Complaint  Patient presents with  . Pulmonary Consult    Pt here to re-establish care for COPD. She was seen here last 11/29/2015.  She was admiitted to [facility] recently for COPD exacerbation 03-20-19-03-24-19.  She states her breathing is doing well today as long as she is using her o2. She is using her proair and neb both 4 x per day.   Dyspnea:  Very sedentary since d/c  Cough: better since started on 02  Sleeping: on side bed is flat. One pillow  SABA use: way over using  02: 1 lpm 24/7  rec Plan A = Automatic = Always=    symbicort 160 x 2 puffs and then spiriva x 2 puffs then symbicort 160 x 2 puffs x 12 hour later Work on inhaler technique:  Plan B = Backup  (to supplement plan A, not to replace it) Only use your albuterol inhaler as a rescue medication Plan C = Crisis (instead of Plan B but only if Plan B stops working) - only use your albuterol nebulizer if you first try Plan B Plan D = Deltasone  = if not getting better > Prednisone 10 mg take  4 each am x 2 days,   2 each am x 2 days,  1 each am x 2 days and stop  Stop atenolol and start bisoprolol 5 mg one daily on 04/02/19  Adjust 02 to keep sats > 90%    05/03/2019  f/u ov/Erika Leach re:  Gold II Spirometry  but 02 dep after most recent hosp d/c now maint on symb/spiriva did not need pred but still developed thrush  Chief Complaint  Patient presents with  . Follow-up    Breathing is doing well. She has had cough with clear to cream colored sputum. She is using her proair about 2 x per wk and she has not used neb.    Dyspnea:  Up the street and back s 02 and sats stayed above 90% but not doing this often Cough: none  Sleeping: on side one pillow/ bed flat  SABA use: rarely 02: hs 2lpm and has portable  rec Decrease the symbicort to 80 Take 2 puffs first thing in am and then another 2 puffs about 12 hours later.  Work on inhaler technique:  Diflucan 100mg  daily x 3 days and stop mevacor x 3 days  Goal is to keep 02 sats above 90% at all time - continue to use 2lpm at bedtime when you can't be monitoring your 02 levels   06/15/2019  f/u ov/Erika Leach re: GOLD II/ maint on symb 80/spiriva with limited techinique Chief Complaint  Patient presents with  . Follow-up    Breathing has been worse just since today- she started coughing yesterday- prod with cream color sputum. She is using her albuterol inhaler 2-3 x per day and she has not used her neb.   Dyspnea:  Ok until one day prior to OV  With increaesed cough / congested  Cough: thick creamy esp ina m  Sleeping: fine on side one pillow  SABA use: just started using  one day prior to OV   02: 2lpm hs/ has portable not using  rec Work on  inhaler   12/14/2019  f/u ov/Erika Leach re: GOLD III copd / fully vaccinated for covid 19  Dyspnea:  Better esp in a/c eg does  shopping fine  Cough: not a lot  Sleeping: fine flat bed / one pillow on side  SABA use: daily  02: only at hs  rec Make sure you check your oxygen saturations   Stay on stiolto  2 pffs each am  Try albuterol 15 min before an activity      03/20/2020  f/u ov/Erika Leach re: GOLD II stiolto maint/c/o mouth sore (really means corners of lips) Chief Complaint  Patient presents with  . Follow-up     reports "doing good"; states stiolto is working but she'd like to try something else.  Dyspnea:  Much better now able to do [employer] walking   Cough: none  Sleeping: able to lie flat / one pillow SABA use: maybe twice daily  02: 2lpm hs / never checks daytime  Rec Stiolto  1-2 puffs each am  Only use your albuterol as a rescue medication  Try albuterol 15 min before an activity clotrimazole cream  for for lips and and troche for your mouth as needed  Please schedule a follow up visit in 3 months but call sooner if needed    Week prior to xmas 2021 > amox/ prednisone    07/10/2020  f/u ov/Erika Leach re:  GOLD II/ 02 dep at 2lpm  On stiolto 2 each am  Chief Complaint  Patient presents with  . Follow-up    SOB  Dyspnea:  [employer] does fine/ [facility]c parking  Cough: improving p flare while on pred, can't tol ICS = thrush Sleeping: bed is flat/ one pillow SABA use: last few days much less / but baseline rarely hfa nebulizer 02: 2lpm hs only and cannot afford the 02 to run it, wants tanks  No obvious day to day or daytime variability or assoc excess/ purulent sputum or mucus plugs or hemoptysis or cp or chest tightness, subjective wheeze or overt sinus or hb symptoms.     without nocturnal  or early am exacerbation  of respiratory  c/o's or need for noct saba. Also denies any obvious fluctuation of symptoms with weather or environmental changes or other aggravating or alleviating factors  except as outlined above   No unusual exposure hx or h/o childhood pna/ asthma or knowledge of premature birth.  Current Allergies, Complete Past Medical History, Past Surgical History, Family History, and Social History were reviewed in [employer] record.  ROS  The following are not active complaints unless bolded Hoarseness, sore throat, dysphagia, dental problems, itching, sneezing,  nasal congestion or discharge of excess mucus or purulent secretions, ear ache,   fever, chills, sweats, unintended wt loss or wt gain, classically pleuritic or exertional cp,  orthopnea pnd or arm/hand swelling  or leg swelling, presyncope, palpitations, abdominal pain, anorexia, nausea, vomiting, diarrhea  or change in bowel habits or change in bladder habits, change in stools or change in urine, dysuria, hematuria,  rash, arthralgias, visual complaints, headache, numbness, weakness or ataxia or problems with walking or coordination,  change in mood or  memory.        Current Meds  Medication Sig  . albuterol (PROVENTIL) (2.5 MG/3ML) 0.083% nebulizer solution Take 2.5 mg by nebulization every 6 (six) hours as needed for wheezing or shortness of breath.   Marland Kitchen albuterol (VENTOLIN HFA) 108 (90 Base) MCG/ACT inhaler Inhale 2 puffs into  the lungs every 6 (six) hours as needed for wheezing or shortness of breath.   Marland Kitchen amLODipine (NORVASC) 10 MG tablet Take 1 tablet (10 mg total) by mouth daily.  Marland Kitchen ascorbic acid (VITAMIN C) 500 MG tablet Take 1 tablet (500 mg total) by mouth daily.  . bisoprolol (ZEBETA) 5 MG tablet TAKE 1 TABLET BY MOUTH EVERY DAY  . cloNIDine (CATAPRES) 0.1 MG tablet Take 1 tablet (0.1 mg total) by mouth 2 (two) times daily.  . clotrimazole (LOTRIMIN) 1 % cream Apply 1 application topically 2 (two) times daily.  . clotrimazole (MYCELEX) 10 MG troche Take 1 tablet (10 mg total) by mouth 5 (five) times daily.  Marland Kitchen gabapentin (NEURONTIN) 300 MG capsule Take 1 capsule by mouth 3  (three) times daily.  Marland Kitchen glipiZIDE (GLUCOTROL) 5 MG tablet Take 5 mg by mouth daily before breakfast.  . HYDROcodone-acetaminophen (NORCO/VICODIN) 5-325 MG tablet Take 1 tablet by mouth 3 (three) times daily as needed.  . loratadine (CLARITIN) 10 MG tablet Take 1 tablet (10 mg total) by mouth daily.  Marland Kitchen lovastatin (MEVACOR) 20 MG tablet Take 20 mg by mouth every morning.   . metFORMIN (GLUCOPHAGE) 500 MG tablet Take 500 mg by mouth 2 (two) times daily.  . Tiotropium Bromide-Olodaterol (STIOLTO RESPIMAT) 2.5-2.5 MCG/ACT AERS INHALE 2 PUFFS BY MOUTH INTO THE LUNGS DAILY                    Objective:   Physical Exam     07/10/2020          187  03/20/2020       188  12/14/2019         197  06/15/2019       198  05/03/2019     196  04/01/2019       196   11/29/2015       200   11/01/15 200 lb 12.8 oz (91.082 kg)  02/14/15 197 lb (89.359 kg)  11/23/14 193 lb 9.6 oz (87.816 kg)   Vital signs reviewed  07/10/2020  - Note at rest 02 sats  97% on RA   General appearance:   Pleasant amb mod obese bf nad   HEENT : pt wearing mask not removed for exam due to covid - 19 concerns.   NECK :  without JVD/Nodes/TM/ nl carotid upstrokes bilaterally   LUNGS: no acc muscle use,  Min barrel  contour chest wall with bilateral  slightly decreased bs s audible wheeze and  without cough on insp or exp maneuvers and min  Hyperresonant  to  percussion bilaterally     CV:  RRR  no s3 or murmur or increase in P2, and no edema   ABD:  Obese soft and nontender with pos end  insp Hoover's  in the supine position. No bruits or organomegaly appreciated, bowel sounds nl  MS:   Nl gait/  ext warm without deformities, calf tenderness, cyanosis or clubbing No obvious joint restrictions   SKIN: warm and dry without lesions    NEURO:  alert, approp, nl sensorium with  no motor or cerebellar deficits apparent.              Assessment & Plan:

## 2020-07-10 NOTE — Assessment & Plan Note (Signed)
Complicated by HBP/ Hyperlipidemia/ DM   Body mass index is 31.22 kg/m.  -  Trending down/ encouraged  Lab Results  Component Value Date   TSH 1.593 06/23/2012     Contributing to gerd risk/ doe/reviewed the need and the process to achieve and maintain neg calorie balance > defer f/u primary care including intermittently monitoring thyroid status    Also try to minimize prednisone exposure.          Each maintenance medication was reviewed in detail including emphasizing most importantly the difference between maintenance and prns and under what circumstances the prns are to be triggered using an action plan format where appropriate.  Total time for H and P, chart review, counseling, teaching device and generating customized AVS unique to this office visit / charting > 30 min

## 2020-07-10 NOTE — Assessment & Plan Note (Addendum)
New start on outpt 02 03/20/2019 p d/c from admit  04/01/2019 Patient Saturations on Room Air at Rest = 96% >>> Room Air while Ambulating = 88% and on  2 Liters of pulsed oxygen while Ambulating = 93% - pt request change to cyclinders only 07/10/2020    Advised this may not be practical but sent order to [facility] to see what they can do to help here.

## 2020-07-10 NOTE — Assessment & Plan Note (Signed)
Trial off acei 11/01/2015 due to cough> resolved 11/29/2015  - changed high dose tenormin to bisoprolol 5 mg daily 04/01/2019   Adequate control on present rx, reviewed in detail with pt > no change in rx needed     

## 2020-07-11 ENCOUNTER — Telehealth: Payer: Self-pay | Admitting: Internal Medicine

## 2020-07-11 DIAGNOSIS — J9611 Chronic respiratory failure with hypoxia: Secondary | ICD-10-CM

## 2020-07-11 NOTE — Telephone Encounter (Signed)
Checking with [employer] to see if they are able to proceed or if anything needs to be done for this O2 order.

## 2020-07-11 NOTE — Telephone Encounter (Signed)
Not using 02 daytime so just needs to be provided with nocturnal 02 2lpm   If can't supply it that way then just continue the concentrator with backups for power outages.  She does need to monitor 02 sats daytime and call if consistently < 89% when doing highest levels of desired exertion because if so need to requalify for portable 02

## 2020-07-11 NOTE — Telephone Encounter (Signed)
Forwarding back to [facility] as FYI.  Please advise if anything else is needed for this order.  Thanks!

## 2020-07-11 NOTE — Telephone Encounter (Signed)
Response from [employer] regarding O2 order placed on 07/11/19:  i've uploaded the current documents and sending for review. however, the order states to provide tanks instead of the concentrator for nocturnal oxygen. please clarify this, is she going from continuous to nocturnal? if so, she will need to keep the concentrator and will only have tanks as backup in case of power outage. also, i saw in the notes that she is at 97% on room air at rest. was a walk test performed that shows her SATs decrease with ambulation? if so, please provide that info as well. thanks.

## 2020-07-11 NOTE — Telephone Encounter (Signed)
Copied from DME order from 07/10/20:  Pt requesting [facility] change from concentrator to [facility] for noct 02 at 2lpm p   MW please clarify- patient wears 2lpm with exertion as well per yesterday's OV note, is she needing to switch to [employer] so she can have her same O2 setup during the day as well as tanks for nocturnal O2?

## 2020-07-12 NOTE — Telephone Encounter (Signed)
New order has been placed.  Nothing further needed at this time- will close encounter.

## 2020-07-12 NOTE — Telephone Encounter (Signed)
Response from [place] with [employer]:  yes, if she's going from continuous to nocturnal, the order needs to state: 'Patient going to nocturnal only at 2L, no longer needs continuous. She will monitor her daily daytime SATs to maintain her SATs at 90 or above.' or something to that effect. once that's available, i'll upload it to her account and we can get her taken care of from there. thanks so much!

## 2020-07-12 NOTE — Telephone Encounter (Signed)
Please adjust order for DME

## 2020-07-12 NOTE — Telephone Encounter (Signed)
Ok to order exactly the way [employer] requesting

## 2020-08-01 ENCOUNTER — Other Ambulatory Visit: Payer: Self-pay | Admitting: Internal Medicine

## 2020-08-01 MED ORDER — STIOLTO RESPIMAT 2.5-2.5 MCG/ACT IN AERS: INHALATION_SPRAY | RESPIRATORY_TRACT | 3 refills | Status: DC

## 2020-08-25 ENCOUNTER — Ambulatory Visit: Payer: Medicare Other | Admitting: Podiatry

## 2020-09-06 ENCOUNTER — Ambulatory Visit (INDEPENDENT_AMBULATORY_CARE_PROVIDER_SITE_OTHER): Payer: Medicare Other | Admitting: Podiatry

## 2020-09-06 ENCOUNTER — Other Ambulatory Visit: Payer: Self-pay

## 2020-09-06 DIAGNOSIS — B351 Tinea unguium: Secondary | ICD-10-CM | POA: Diagnosis not present

## 2020-09-06 DIAGNOSIS — Z79899 Other long term (current) drug therapy: Secondary | ICD-10-CM | POA: Diagnosis not present

## 2020-09-07 ENCOUNTER — Other Ambulatory Visit: Payer: Self-pay | Admitting: Podiatry

## 2020-09-07 LAB — HEPATIC FUNCTION PANEL
AG Ratio: 2.5 (calc) (ref 1.0–2.5)
ALT: 10 U/L (ref 6–29)
AST: 11 U/L (ref 10–35)
Albumin: 4.7 g/dL (ref 3.6–5.1)
Alkaline phosphatase (APISO): 77 U/L (ref 37–153)
Bilirubin, Direct: 0.1 mg/dL (ref 0.0–0.2)
Globulin: 1.9 g/dL (calc) (ref 1.9–3.7)
Indirect Bilirubin: 0.2 mg/dL (calc) (ref 0.2–1.2)
Total Bilirubin: 0.3 mg/dL (ref 0.2–1.2)
Total Protein: 6.6 g/dL (ref 6.1–8.1)

## 2020-09-07 MED ORDER — TERBINAFINE HCL 250 MG PO TABS: 250.0000 mg | ORAL_TABLET | Freq: Every day | ORAL | 0 refills | Status: DC

## 2020-09-08 ENCOUNTER — Encounter: Payer: Self-pay | Admitting: Podiatry

## 2020-09-08 NOTE — Progress Notes (Signed)
Subjective:  Patient ID: Erika Leach, female    DOB: 08-Jul-1951,  MRN: [id number]  Chief Complaint  Patient presents with  . Nail Problem    Diabetic foot exam, nails are discolored and sore     70 y.o. female presents with the above complaint.  Patient presents with complaint of thickened elongated dystrophic toenails x10.  Patient would like to know if there is anything that could be done in terms of her fungal nail.  She would like to discuss treatment options for it.  She is a diabetic with last A1c of 7.9.  She denies any other treatment options.  She does not have any history of liver disease or issues.  She would like to discuss p.o. options for left treatment for fungus.   Review of Systems: Negative except as noted in the HPI. Denies N/V/F/Ch.  Past Medical History:  Diagnosis Date  . Asthma   . Chest pain 2014   Cath 10/14>>normal CA  . Complete heart block ([place]) 06/24/2012  . HTN (hypertension)    poorly controlled  . 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    Current Outpatient Medications:  .  albuterol (PROVENTIL) (2.5 MG/3ML) 0.083% nebulizer solution, Take 2.5 mg by nebulization every 6 (six) hours as needed for wheezing or shortness of breath. , Disp: , Rfl:  .  albuterol (VENTOLIN HFA) 108 (90 Base) MCG/ACT inhaler, Inhale 2 puffs into the lungs every 6 (six) hours as needed for wheezing or shortness of breath. , Disp: , Rfl:  .  amLODipine (NORVASC) 10 MG tablet, Take 1 tablet (10 mg total) by mouth daily., Disp: , Rfl: 3 .  ascorbic acid (VITAMIN C) 500 MG tablet, Take 1 tablet (500 mg total) by mouth daily., Disp:  , Rfl:  .  bisoprolol (ZEBETA) 5 MG tablet, TAKE 1 TABLET BY MOUTH EVERY DAY, Disp: 90 tablet, Rfl: 3 .  cloNIDine (CATAPRES) 0.1 MG tablet, Take 1 tablet (0.1 mg total) by mouth 2 (two) times daily., Disp: 60 tablet, Rfl: 0 .  clotrimazole (LOTRIMIN) 1 % cream, Apply 1 application topically 2 (two) times daily., Disp: 30 g, Rfl: 11 .  clotrimazole (MYCELEX)  10 MG troche, Take 1 tablet (10 mg total) by mouth 5 (five) times daily., Disp: 35 Troche, Rfl: 11 .  famotidine (PEPCID) 20 MG tablet, One after supper, Disp: 30 tablet, Rfl: 11 .  gabapentin (NEURONTIN) 300 MG capsule, Take 1 capsule by mouth 3 (three) times daily., Disp: , Rfl: 11 .  glipiZIDE (GLUCOTROL) 5 MG tablet, Take 5 mg by mouth daily before breakfast., Disp: , Rfl:  .  HYDROcodone-acetaminophen (NORCO/VICODIN) 5-325 MG tablet, Take 1 tablet by mouth 3 (three) times daily as needed., Disp: , Rfl:  .  loratadine (CLARITIN) 10 MG tablet, Take 1 tablet (10 mg total) by mouth daily., Disp: 30 tablet, Rfl: 1 .  lovastatin (MEVACOR) 20 MG tablet, Take 20 mg by mouth every morning. , Disp: , Rfl:  .  metFORMIN (GLUCOPHAGE) 500 MG tablet, Take 500 mg by mouth 2 (two) times daily., Disp: , Rfl: 11 .  pantoprazole (PROTONIX) 40 MG tablet, Take 1 tablet (40 mg total) by mouth daily. Take 30-60 min before first meal of the day, Disp: 30 tablet, Rfl: 2 .  predniSONE (DELTASONE) 10 MG tablet, Take  4 each am x 2 days,   2 each am x 2 days,  1 each am x 2 days and stop, Disp: 14 tablet, Rfl: 3 .  terbinafine (LAMISIL) 250 MG tablet,  Take 1 tablet (250 mg total) by mouth daily., Disp: 90 tablet, Rfl: 0 .  Tiotropium Bromide-Olodaterol (STIOLTO RESPIMAT) 2.5-2.5 MCG/ACT AERS, INHALE 2 PUFFS BY MOUTH INTO THE LUNGS DAILY, Disp: 12 g, Rfl: 3  Social History   Tobacco Use  Smoking Status Former Smoker  . Packs/day: 1.00  . Years: 40.00  . Pack years: 40.00  . Types: Cigarettes  . Quit date: 07/08/2012  . Years since quitting: 8.1  Smokeless Tobacco Never Used    No Known Allergies Objective:  There were no vitals filed for this visit. There is no height or weight on file to calculate BMI. Constitutional Well developed. Well nourished.  Vascular Dorsalis pedis pulses palpable bilaterally. Posterior tibial pulses palpable bilaterally. Capillary refill normal to all digits.  No cyanosis or  clubbing noted. Pedal hair growth normal.  Neurologic Normal speech. Oriented to person, place, and time. Epicritic sensation to light touch grossly present bilaterally.  Dermatologic  thickened elongated dystrophic toenails x10.  Mild pain on palpation.  Mycotic nature to it.  Orthopedic: Normal joint ROM without pain or crepitus bilaterally. No visible deformities. No bony tenderness.   Radiographs: None Assessment:   1. Long-term use of high-risk medication   2. Onychomycosis due to dermatophyte   3. Nail fungus    Plan:  Patient was evaluated and treated and all questions answered.  Bilateral onychomycosis toenails x10 -Educated the patient on the etiology of onychomycosis and various treatment options associated with improving the fungal load.  I explained to the patient that there is 3 treatment options available to treat the onychomycosis including topical, p.o., laser treatment.  Patient elected to undergo p.o. options with Lamisil/terbinafine therapy.  In order for me to start the medication therapy, I explained to the patient the importance of evaluating the liver and obtaining the liver function test.  Once the liver function test comes back normal I will start him on 65-month course of Lamisil therapy.  Patient understood all risk and would like to proceed with Lamisil therapy.  I have asked the patient to immediately stop the Lamisil therapy if she has any reactions to it and call the office or go to the emergency room right away.  Patient states understanding   No follow-ups on file.

## 2020-09-12 ENCOUNTER — Ambulatory Visit (INDEPENDENT_AMBULATORY_CARE_PROVIDER_SITE_OTHER): Payer: Medicare Other

## 2020-09-12 DIAGNOSIS — I442 Atrioventricular block, complete: Secondary | ICD-10-CM | POA: Diagnosis not present

## 2020-09-14 LAB — CUP PACEART REMOTE DEVICE CHECK
Battery Impedance: 1188 Ohm
Battery Remaining Longevity: 51 mo
Battery Voltage: 2.76 V
Brady Statistic AP VP Percent: 73 %
Brady Statistic AP VS Percent: 1 %
Brady Statistic AS VP Percent: 16 %
Brady Statistic AS VS Percent: 10 %
Date Time Interrogation Session: 20220309113251
Implantable Lead Implant Date: 20131218
Implantable Lead Implant Date: 20131218
Implantable Lead Location: 753859
Implantable Lead Location: 753860
Implantable Lead Model: 5076
Implantable Lead Model: 5076
Implantable Pulse Generator Implant Date: 20131218
Lead Channel Impedance Value: 510 Ohm
Lead Channel Impedance Value: 516 Ohm
Lead Channel Pacing Threshold Amplitude: 0.625 V
Lead Channel Pacing Threshold Amplitude: 0.875 V
Lead Channel Pacing Threshold Pulse Width: 0.4 ms
Lead Channel Pacing Threshold Pulse Width: 0.4 ms
Lead Channel Setting Pacing Amplitude: 2 V
Lead Channel Setting Pacing Amplitude: 2.5 V
Lead Channel Setting Pacing Pulse Width: 0.4 ms
Lead Channel Setting Sensing Sensitivity: 2 mV

## 2020-09-20 NOTE — Progress Notes (Signed)
Remote pacemaker transmission.   

## 2020-10-05 ENCOUNTER — Other Ambulatory Visit: Payer: Self-pay | Admitting: Internal Medicine

## 2020-10-05 MED ORDER — PANTOPRAZOLE SODIUM 40 MG PO TBEC: 40.0000 mg | DELAYED_RELEASE_TABLET | Freq: Every day | ORAL | 2 refills | Status: DC

## 2020-10-10 ENCOUNTER — Ambulatory Visit: Payer: Medicare Other | Admitting: Internal Medicine

## 2020-10-16 ENCOUNTER — Encounter: Payer: Self-pay | Admitting: Acute Care

## 2020-10-16 ENCOUNTER — Ambulatory Visit: Payer: Medicare Other | Admitting: Internal Medicine

## 2020-10-16 ENCOUNTER — Other Ambulatory Visit: Payer: Self-pay

## 2020-10-16 ENCOUNTER — Ambulatory Visit (INDEPENDENT_AMBULATORY_CARE_PROVIDER_SITE_OTHER): Payer: Medicare Other | Admitting: Acute Care

## 2020-10-16 VITALS — BP 130/70 | HR 60 | Temp 97.8°F | Ht 65.0 in | Wt 195.8 lb

## 2020-10-16 DIAGNOSIS — J449 Chronic obstructive pulmonary disease, unspecified: Secondary | ICD-10-CM | POA: Diagnosis not present

## 2020-10-16 DIAGNOSIS — J302 Other seasonal allergic rhinitis: Secondary | ICD-10-CM

## 2020-10-16 DIAGNOSIS — G4734 Idiopathic sleep related nonobstructive alveolar hypoventilation: Secondary | ICD-10-CM | POA: Diagnosis not present

## 2020-10-16 MED ORDER — LEVALBUTEROL HCL 0.31 MG/3ML IN NEBU: 1.0000 | INHALATION_SOLUTION | RESPIRATORY_TRACT | 12 refills | Status: DC | PRN

## 2020-10-16 NOTE — Progress Notes (Signed)
History of Present Illness Erika Leach is a 70 y.o. female former smoker ( Quit 2014 with a 40 pack year smoking history) with history of  Asthma as a child and COPD Gold III. She has been on inhalers since 2013, referred to pulmonary clinic 11/01/2015 by Dr Sheryle Hail with dx of GOLD III copd .  HPI: 70 year old female, former smoker quit 2014 (40 pack year hx). PMH significant for COPD GOLD II, OSA, acute on chronic respiratory failure, UMPNT-61, diastolic heart failure s/p pacemaker, hypertension, diabetes, obesity. Patient of Dr. Melvyn Novas, last seen on 06/15/19. She was hospitalized in December 2020 for Covid. Maintained on Stialto. Night time O2 dependent.    10/16/2020  3 month follow up Pt. States she is doing well. She is using her Stialto every day. She has been having to use her rescue inhaler at least once daily with the Spring seasonal allergies. However, normally she only uses it a few times weekly. She does have a cough, but not frequent. She has a small amount of phlegm which is clear to cream colored. She is compliant with her oxygen at bedtime. She has plenty of medication ( Stialto and rescue medications).She states she has had a stable interval.   Received both covid vaccines (pfizer).   Test Results: Pulmonary function testing: PFT's 10/19/14 FEV1 1.24 (57 % ) ratio 53 p 9 % improvement from saba p ? prior to study with DLCO 38 % corrects to 52 % for alv volume           CBC Latest Ref Rng & Units 07/10/2019 07/09/2019 07/08/2019  WBC 4.0 - 10.5 K/uL 10.3 11.3(H) 9.2  Hemoglobin 12.0 - 15.0 g/dL 12.8 12.5 12.6  Hematocrit 36.0 - 46.0 % 37.8 37.9 36.8  Platelets 150 - 400 K/uL 411(H) 443(H) 336    BMP Latest Ref Rng & Units 07/10/2019 07/09/2019 07/08/2019  Glucose 70 - 99 mg/dL 221(H) 239(H) 262(H)  BUN 8 - 23 mg/dL 23 17 17   Creatinine 0.44 - 1.00 mg/dL 0.88 0.74 0.71  BUN/Creat Ratio 12 - 28 - - -  Sodium 135 - 145 mmol/L 139 139 139  Potassium 3.5 - 5.1 mmol/L  3.9 3.5 3.9  Chloride 98 - 111 mmol/L 98 99 101  CO2 22 - 32 mmol/L 26 28 25   Calcium 8.9 - 10.3 mg/dL 9.1 8.7(L) 9.0    BNP    Component Value Date/Time   BNP 51.6 07/06/2019 1630    ProBNP    Component Value Date/Time   PROBNP 17.0 10/04/2014 1003    PFT    Component Value Date/Time   FEV1PRE 0.96 12/14/2019 1443   FEV1POST 1.24 10/19/2014 1041   FVCPRE 1.94 12/14/2019 1443   FVCPOST 2.34 10/19/2014 1041   TLC 4.87 12/14/2019 1443   DLCOUNC 8.28 12/14/2019 1443   PREFEV1FVCRT 49 12/14/2019 1443   PSTFEV1FVCRT 53 10/19/2014 1041    No results found.   Past medical hx Past Medical History:  Diagnosis Date  . Asthma   . Chest pain 2014   Cath 10/14>>normal CA  . Complete heart block ([place]) 06/24/2012  . HTN (hypertension)    poorly controlled  . 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     Social History   Tobacco Use  . Smoking status: Former Smoker    Packs/day: 1.00    Years: 40.00    Pack years: 40.00    Types: Cigarettes    Quit date: 07/08/2012    Years since quitting: 8.2  .  Smokeless tobacco: Never Used  Vaping Use  . Vaping Use: Never used  Substance Use Topics  . Alcohol use: No    Alcohol/week: 0.0 standard drinks    Comment: rare - special occasions  . Drug use: No    [name] reports that she quit smoking about 8 years ago. Her smoking use included cigarettes. She has a 40.00 pack-year smoking history. She has never used smokeless tobacco. She reports that she does not drink alcohol and does not use drugs.  Tobacco Cessation: Former smoker quit 2014 with a 40 pack year smoking history  Past surgical hx, Family hx, Social hx all reviewed.  Current Outpatient Medications on File Prior to Visit  Medication Sig  . albuterol (PROVENTIL) (2.5 MG/3ML) 0.083% nebulizer solution Take 2.5 mg by nebulization every 6 (six) hours as needed for wheezing or shortness of breath.   Marland Kitchen albuterol (VENTOLIN HFA) 108 (90 Base) MCG/ACT inhaler Inhale 2 puffs into  the lungs every 6 (six) hours as needed for wheezing or shortness of breath.   Marland Kitchen amLODipine (NORVASC) 10 MG tablet Take 1 tablet (10 mg total) by mouth daily.  Marland Kitchen ascorbic acid (VITAMIN C) 500 MG tablet Take 1 tablet (500 mg total) by mouth daily.  . bisoprolol (ZEBETA) 5 MG tablet TAKE 1 TABLET BY MOUTH EVERY DAY  . cloNIDine (CATAPRES) 0.1 MG tablet Take 1 tablet (0.1 mg total) by mouth 2 (two) times daily.  . clotrimazole (LOTRIMIN) 1 % cream Apply 1 application topically 2 (two) times daily.  . clotrimazole (MYCELEX) 10 MG troche Take 1 tablet (10 mg total) by mouth 5 (five) times daily.  Marland Kitchen COVID-19 mRNA vaccine, Pfizer, 30 MCG/0.3ML injection INJECT AS DIRECTED  . famotidine (PEPCID) 20 MG tablet One after supper  . gabapentin (NEURONTIN) 300 MG capsule Take 1 capsule by mouth 3 (three) times daily.  Marland Kitchen glipiZIDE (GLUCOTROL) 5 MG tablet Take 5 mg by mouth daily before breakfast.  . HYDROcodone-acetaminophen (NORCO/VICODIN) 5-325 MG tablet Take 1 tablet by mouth 3 (three) times daily as needed.  . loratadine (CLARITIN) 10 MG tablet Take 1 tablet (10 mg total) by mouth daily.  Marland Kitchen lovastatin (MEVACOR) 20 MG tablet Take 20 mg by mouth every morning.   . metFORMIN (GLUCOPHAGE) 500 MG tablet Take 500 mg by mouth 2 (two) times daily.  . pantoprazole (PROTONIX) 40 MG tablet Take 1 tablet (40 mg total) by mouth daily. Take 30-60 min before first meal of the day  . predniSONE (DELTASONE) 10 MG tablet Take  4 each am x 2 days,   2 each am x 2 days,  1 each am x 2 days and stop  . terbinafine (LAMISIL) 250 MG tablet Take 1 tablet (250 mg total) by mouth daily.  . Tiotropium Bromide-Olodaterol (STIOLTO RESPIMAT) 2.5-2.5 MCG/ACT AERS INHALE 2 PUFFS BY MOUTH INTO THE LUNGS DAILY   No current facility-administered medications on file prior to visit.     Not on File  Review Of Systems:  Constitutional:   No  weight loss, night sweats,  Fevers, chills, fatigue, or  lassitude.  HEENT:   No headaches,   Difficulty swallowing,  Tooth/dental problems, or  Sore throat,                No sneezing, itching, ear ache, nasal congestion, post nasal drip,   CV:  No chest pain,  Orthopnea, PND, swelling in lower extremities, anasarca, dizziness, palpitations, syncope.   GI  No heartburn, indigestion, abdominal pain, nausea, vomiting, diarrhea,  change in bowel habits, loss of appetite, bloody stools.   Resp: Occasional shortness of breath with exertion less at rest.  No excess mucus, no productive cough,  No non-productive cough,  No coughing up of blood.  No change in color of mucus.  No wheezing.  No chest wall deformity  Skin: no rash or lesions.  GU: no dysuria, change in color of urine, no urgency or frequency.  No flank pain, no hematuria   MS:  No joint pain or swelling.  No decreased range of motion.  No back pain.  Psych:  No change in mood or affect. No depression or anxiety.  No memory loss.   Vital Signs BP 130/70 (BP Location: Left Arm, Cuff Size: Normal)   Pulse 60   Temp 97.8 F (36.6 C) (Oral)   Ht 5\' 5"  (1.651 m)   Wt 195 lb 12.8 oz (88.8 kg)   LMP  (LMP Unknown)   SpO2 100%   BMI 32.58 kg/m    Physical Exam:  General- No distress,  A&Ox3,. pleasant ENT: No sinus tenderness, TM clear, pale nasal mucosa, no oral exudate,no post nasal drip, no LAN Cardiac: S1, S2, regular rate and rhythm, no murmur Chest: No wheeze/ rales/ dullness; no accessory muscle use, no nasal flaring, no sternal retractions, diminished per bases, prolonged expiratory phase Abd.: Soft Non-tender, ND, BS +. Body mass index is 32.58 kg/m. Ext: No clubbing cyanosis, edema Neuro:  normal strength, MAE x 4 , A& O X 3 Skin: No rashes, no lesions, warm and dry, intact Psych: normal mood and behavior   Assessment/Plan COPD Stable interval Plan Continue your Stialto daily as you have been doing. Rinse mouth after use. Use Albuterol rescue as needed for breakthrough shortness of breath or  wheezing. Add non-sedating antihistamine of choice during allergy season. Check allergy index daily to guide therapy if you don't want to take every day. Continue night time oxygen as you have been doing.  Follow up with Dr. Melvyn Novas or Judson Roch NP  in 3 months Call sooner if you need [place] sooner Please contact office for sooner follow up if symptoms do not improve or worsen or seek emergency care   Seasonal Allergies Worse at present time than baseline Plan Add non-sedating antihistamine of choice during allergy season. Check allergy index daily to guide therapy if you don't want to take every day.  Nocturnal Hypoxemia  Plan  Continue night time oxygen as you have been doing at 1 liter.    Magdalen Spatz, NP 10/16/2020  11:44 AM

## 2020-10-16 NOTE — Patient Instructions (Signed)
It is good to see you today. Continue your Stialto daily as you have been doing. Rinse mouth after use. Use Albuterol rescue as needed for breakthrough shortness of breath or wheezing. Add non-sedating antihistamine of choice during allergy season. Check allergy index daily to guide therapy if you don't want to take every day. Continue night time oxygen as you have been doing.  Follow up with Dr. Melvyn Novas or Judson Roch NP  in 3 months Call sooner if you need [place] sooner Please contact office for sooner follow up if symptoms do not improve or worsen or seek emergency care

## 2020-10-18 ENCOUNTER — Telehealth: Payer: Self-pay | Admitting: Internal Medicine

## 2020-10-18 NOTE — Telephone Encounter (Signed)
Spoke with pt who stated Albuterol nebulizer solution was refilled when it was not needed by the pt. Pt was unsure if it could be returned to pharmacy. RN called pharmacy who stated once medication had left the building it could not be refunded. Pt was notified of pharmacy policy and stated understanding. RN apologized for any inconvence the office may had caused to pt. Nothing further needed at this time.

## 2020-11-02 ENCOUNTER — Telehealth: Payer: Self-pay | Admitting: Internal Medicine

## 2020-11-02 NOTE — Telephone Encounter (Signed)
Called and spoke to patient -I am unsure of what the bill was for since has now gone to collections - I have sent an email to Kathlee Nations to see if she can help - advised patient I will call her back -pr

## 2020-11-17 ENCOUNTER — Other Ambulatory Visit: Payer: Self-pay | Admitting: Podiatry

## 2020-11-17 NOTE — Telephone Encounter (Signed)
Please advise 

## 2020-11-21 ENCOUNTER — Other Ambulatory Visit: Payer: Self-pay | Admitting: Internal Medicine

## 2020-11-21 DIAGNOSIS — Z1231 Encounter for screening mammogram for malignant neoplasm of breast: Secondary | ICD-10-CM

## 2020-11-21 NOTE — Telephone Encounter (Signed)
Still no response from Kathlee Nations - sent follow up email. -pr

## 2020-11-21 NOTE — Telephone Encounter (Signed)
Patrice, please advise if you have heard back from [employer]. Thanks.

## 2020-11-22 ENCOUNTER — Ambulatory Visit: Payer: Self-pay

## 2020-12-01 NOTE — Telephone Encounter (Signed)
Erika Leach responded states that pt will need to call [employer] advise that they were supposed to be primary and Medicaid secondary, then have [facility] reprocess the claim - that should allow the claim to be paid. Called and spoke to the patient and advised her of this. Patient voiced understanding. -pr

## 2020-12-06 ENCOUNTER — Ambulatory Visit: Payer: Medicare Other | Attending: Internal Medicine

## 2020-12-06 DIAGNOSIS — Z23 Encounter for immunization: Secondary | ICD-10-CM

## 2020-12-06 NOTE — Progress Notes (Signed)
   Covid-19 Vaccination Clinic  Name:  Erika Leach    MRN: [id number] DOB: [birth year]  12/06/2020  [name] was observed post Covid-19 immunization for 15 minutes without incident. She was provided with Vaccine Information Sheet and instruction to access the V-Safe system.   [name] was instructed to call 911 with any severe reactions post vaccine: Marland Kitchen Difficulty breathing  . Swelling of face and throat  . A fast heartbeat  . A bad rash all over body  . Dizziness and weakness   Immunizations Administered    Name Date Dose VIS Date Route   PFIZER Comrnaty(Gray TOP) Covid-19 Vaccine 12/06/2020  9:25 AM 0.3 mL 06/15/2020 Intramuscular   Manufacturer: [employer], [employer]   Lot: [id number]   NDC: [phone]

## 2020-12-12 ENCOUNTER — Ambulatory Visit (INDEPENDENT_AMBULATORY_CARE_PROVIDER_SITE_OTHER): Payer: Medicare Other

## 2020-12-12 DIAGNOSIS — I442 Atrioventricular block, complete: Secondary | ICD-10-CM | POA: Diagnosis not present

## 2020-12-13 LAB — CUP PACEART REMOTE DEVICE CHECK
Battery Impedance: 1292 Ohm
Battery Remaining Longevity: 48 mo
Battery Voltage: 2.76 V
Brady Statistic AP VP Percent: 74 %
Brady Statistic AP VS Percent: 1 %
Brady Statistic AS VP Percent: 15 %
Brady Statistic AS VS Percent: 9 %
Date Time Interrogation Session: 20220608130907
Implantable Lead Implant Date: 20131218
Implantable Lead Implant Date: 20131218
Implantable Lead Location: 753859
Implantable Lead Location: 753860
Implantable Lead Model: 5076
Implantable Lead Model: 5076
Implantable Pulse Generator Implant Date: 20131218
Lead Channel Impedance Value: 497 Ohm
Lead Channel Impedance Value: 516 Ohm
Lead Channel Pacing Threshold Amplitude: 0.75 V
Lead Channel Pacing Threshold Amplitude: 0.75 V
Lead Channel Pacing Threshold Pulse Width: 0.4 ms
Lead Channel Pacing Threshold Pulse Width: 0.4 ms
Lead Channel Setting Pacing Amplitude: 2 V
Lead Channel Setting Pacing Amplitude: 2.5 V
Lead Channel Setting Pacing Pulse Width: 0.4 ms
Lead Channel Setting Sensing Sensitivity: 2 mV

## 2020-12-14 ENCOUNTER — Other Ambulatory Visit (HOSPITAL_COMMUNITY): Payer: Self-pay

## 2020-12-14 MED ORDER — COVID-19 MRNA VAC-TRIS(PFIZER) 30 MCG/0.3ML IM SUSP
INTRAMUSCULAR | 0 refills | Status: DC
  Filled 2020-12-14: qty 0.3, 1d supply, fill #0

## 2020-12-20 ENCOUNTER — Other Ambulatory Visit (HOSPITAL_COMMUNITY): Payer: Self-pay

## 2020-12-22 ENCOUNTER — Other Ambulatory Visit: Payer: Self-pay | Admitting: Podiatry

## 2020-12-26 ENCOUNTER — Other Ambulatory Visit: Payer: Self-pay | Admitting: Internal Medicine

## 2020-12-29 ENCOUNTER — Other Ambulatory Visit: Payer: Self-pay

## 2020-12-29 MED ORDER — BISOPROLOL FUMARATE 5 MG PO TABS: 5.0000 mg | ORAL_TABLET | Freq: Every day | ORAL | 2 refills | Status: DC

## 2020-12-29 NOTE — Progress Notes (Signed)
Refill for bisoprolol sent into pharmacy

## 2021-01-03 NOTE — Progress Notes (Signed)
Remote pacemaker transmission.   

## 2021-01-10 ENCOUNTER — Ambulatory Visit (INDEPENDENT_AMBULATORY_CARE_PROVIDER_SITE_OTHER): Payer: Medicare Other | Admitting: Podiatry

## 2021-01-10 ENCOUNTER — Other Ambulatory Visit: Payer: Self-pay

## 2021-01-10 DIAGNOSIS — B351 Tinea unguium: Secondary | ICD-10-CM | POA: Diagnosis not present

## 2021-01-10 DIAGNOSIS — Z79899 Other long term (current) drug therapy: Secondary | ICD-10-CM

## 2021-01-11 ENCOUNTER — Encounter: Payer: Self-pay | Admitting: Podiatry

## 2021-01-11 NOTE — Progress Notes (Signed)
Subjective:  Patient ID: Erika Leach, female    DOB: 1951-05-15,  MRN: [id number]  Chief Complaint  Patient presents with   Nail Problem    Nail fungus follow up  PT stated that she feels like the medication has helped with the fungus on her nails     70 y.o. female presents with the above complaint.  Patient presents with thickened elongated dystrophic toenails/mycotic toenails x10.  Patient states the Lamisil therapy is definitely helping is making her toe discoloration much better.  She is now currently on second Lamisil therapy.  She denies any other acute complaints   Review of Systems: Negative except as noted in the HPI. Denies N/V/F/Ch.  Past Medical History:  Diagnosis Date   Asthma    Chest pain 2014   Cath 10/14>>normal CA   Complete heart block ([place]) 06/24/2012   HTN (hypertension)    poorly controlled   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    Current Outpatient Medications:    albuterol (PROVENTIL) (2.5 MG/3ML) 0.083% nebulizer solution, Take 2.5 mg by nebulization every 6 (six) hours as needed for wheezing or shortness of breath. , Disp: , Rfl:    albuterol (VENTOLIN HFA) 108 (90 Base) MCG/ACT inhaler, Inhale 2 puffs into the lungs every 6 (six) hours as needed for wheezing or shortness of breath. , Disp: , Rfl:    amLODipine (NORVASC) 10 MG tablet, Take 1 tablet (10 mg total) by mouth daily., Disp: , Rfl: 3   ascorbic acid (VITAMIN C) 500 MG tablet, Take 1 tablet (500 mg total) by mouth daily., Disp:  , Rfl:    bisoprolol (ZEBETA) 5 MG tablet, Take 1 tablet (5 mg total) by mouth daily., Disp: 90 tablet, Rfl: 2   cloNIDine (CATAPRES) 0.1 MG tablet, Take 1 tablet (0.1 mg total) by mouth 2 (two) times daily., Disp: 60 tablet, Rfl: 0   clotrimazole (LOTRIMIN) 1 % cream, Apply 1 application topically 2 (two) times daily., Disp: 30 g, Rfl: 11   clotrimazole (MYCELEX) 10 MG troche, Take 1 tablet (10 mg total) by mouth 5 (five) times daily., Disp: 35 Troche, Rfl: 11   COVID-19  mRNA Vac-TriS, Pfizer, SUSP injection, Inject into the muscle., Disp: 0.3 mL, Rfl: 0   COVID-19 mRNA vaccine, Pfizer, 30 MCG/0.3ML injection, INJECT AS DIRECTED, Disp: .3 mL, Rfl: 0   famotidine (PEPCID) 20 MG tablet, One after supper, Disp: 30 tablet, Rfl: 11   gabapentin (NEURONTIN) 300 MG capsule, Take 1 capsule by mouth 3 (three) times daily., Disp: , Rfl: 11   glipiZIDE (GLUCOTROL) 5 MG tablet, Take 5 mg by mouth daily before breakfast., Disp: , Rfl:    HYDROcodone-acetaminophen (NORCO/VICODIN) 5-325 MG tablet, Take 1 tablet by mouth 3 (three) times daily as needed., Disp: , Rfl:    loratadine (CLARITIN) 10 MG tablet, Take 1 tablet (10 mg total) by mouth daily., Disp: 30 tablet, Rfl: 1   lovastatin (MEVACOR) 20 MG tablet, Take 20 mg by mouth every morning. , Disp: , Rfl:    metFORMIN (GLUCOPHAGE) 500 MG tablet, Take 500 mg by mouth 2 (two) times daily., Disp: , Rfl: 11   pantoprazole (PROTONIX) 40 MG tablet, Take 1 tablet (40 mg total) by mouth daily. Take 30-60 min before first meal of the day, Disp: 30 tablet, Rfl: 2   predniSONE (DELTASONE) 10 MG tablet, Take  4 each am x 2 days,   2 each am x 2 days,  1 each am x 2 days and stop, Disp: 14 tablet,  Rfl: 3   terbinafine (LAMISIL) 250 MG tablet, TAKE 1 TABLET BY MOUTH EVERY DAY, Disp: 90 tablet, Rfl: 0   Tiotropium Bromide-Olodaterol (STIOLTO RESPIMAT) 2.5-2.5 MCG/ACT AERS, INHALE 2 PUFFS BY MOUTH INTO THE LUNGS DAILY, Disp: 12 g, Rfl: 3  Social History   Tobacco Use  Smoking Status Former   Packs/day: 1.00   Years: 40.00   Pack years: 40.00   Types: Cigarettes   Quit date: 07/08/2012   Years since quitting: 8.5  Smokeless Tobacco Never    No Known Allergies Objective:  There were no vitals filed for this visit. There is no height or weight on file to calculate BMI. Constitutional Well developed. Well nourished.  Vascular Dorsalis pedis pulses palpable bilaterally. Posterior tibial pulses palpable bilaterally. Capillary refill  normal to all digits.  No cyanosis or clubbing noted. Pedal hair growth normal.  Neurologic Normal speech. Oriented to person, place, and time. Epicritic sensation to light touch grossly present bilaterally.  Dermatologic  thickened elongated dystrophic toenails x10 improving.  Mild pain on palpation.  Mycotic nature to it.  Orthopedic: Normal joint ROM without pain or crepitus bilaterally. No visible deformities. No bony tenderness.   Radiographs: None Assessment:   1. Long-term use of high-risk medication   2. Onychomycosis due to dermatophyte   3. Nail fungus     Plan:  Patient was evaluated and treated and all questions answered.  Bilateral onychomycosis toenails x10~second round -Educated the patient on the etiology of onychomycosis and various treatment options associated with improving the fungal load.  I explained to the patient that there is 3 treatment options available to treat the onychomycosis including topical, p.o., laser treatment.  Patient has elected to finish the second round of Lamisil therapy.  At this time I discussed with the patient I will not be doing any more Lamisil therapy.  She was already started on second rounds of Lamisil therapy.  I discussed with her to look out for symptoms of liver issues and I discussed this in extensive detail she states understanding.  Immediately if she has any systemic concerns.   No follow-ups on file.

## 2021-01-15 ENCOUNTER — Ambulatory Visit (INDEPENDENT_AMBULATORY_CARE_PROVIDER_SITE_OTHER): Payer: Medicare Other | Admitting: Acute Care

## 2021-01-15 ENCOUNTER — Ambulatory Visit: Payer: Medicare Other | Admitting: Internal Medicine

## 2021-01-15 ENCOUNTER — Other Ambulatory Visit: Payer: Self-pay

## 2021-01-15 ENCOUNTER — Encounter: Payer: Self-pay | Admitting: Acute Care

## 2021-01-15 VITALS — BP 124/74 | HR 81 | Temp 97.1°F | Ht 65.0 in | Wt 195.0 lb

## 2021-01-15 DIAGNOSIS — J441 Chronic obstructive pulmonary disease with (acute) exacerbation: Secondary | ICD-10-CM

## 2021-01-15 MED ORDER — DOXYCYCLINE HYCLATE 100 MG PO TABS: 100.0000 mg | ORAL_TABLET | Freq: Two times a day (BID) | ORAL | 0 refills | Status: DC

## 2021-01-15 MED ORDER — PREDNISONE 10 MG PO TABS: ORAL_TABLET | ORAL | 0 refills | Status: DC

## 2021-01-15 MED ORDER — BREZTRI AEROSPHERE 160-9-4.8 MCG/ACT IN AERO: 2.0000 | INHALATION_SPRAY | Freq: Two times a day (BID) | RESPIRATORY_TRACT | 0 refills | Status: DC

## 2021-01-15 NOTE — Patient Instructions (Addendum)
It is good to see you today. We will treat your for a COPD flare. We will send in a prescription for Doxycycline 100 mg BID x 7 days.  Please take probiotic with antibiotic.  I like Culturelle, and this can be purchased over the counter at [place] or any drug store.  If you prefer you can eat Activia Yogurt. Use sun Block if in the sun.  We will also prescribe a prednisone taper. Prednisone taper; 10 mg tablets: 4 tabs x 2 days, 3 tabs x 2 days, 2 tabs x 2 days 1 tab x 2 days then stop.  We will try some samples of Breztri. Take 2 puffs twice daily. Rinse mouth after use.  Do not use Stialto while you are using the [employer].  Continue night time oxygen Follow up in 2 weeks to ensure you are better, and to see if you like Breztri. We will walk you at follow up. You will need follow up PFT's once you are better.  Call if you need [place] sooner.  Please contact office for sooner follow up if symptoms do not improve or worsen or seek emergency care

## 2021-01-15 NOTE — Progress Notes (Signed)
History of Present Illness Erika Leach is a 70 y.o. female former smoker ( Quit 2014 with a 40 pack year smoking history) with history of  Asthma as a child and COPD Gold III. She has been on inhalers since 2013, referred to pulmonary clinic 11/01/2015 by Dr Erika Leach with dx of GOLD III copd .She is followed by Dr. Melvyn Leach.  HPI: 70 year old female, former smoker quit 2014 (40 pack year hx). PMH significant for COPD GOLD II, OSA, acute on chronic respiratory failure, [id number], diastolic heart failure s/p pacemaker, hypertension, diabetes, obesity. Patient of Dr. Melvyn Leach, last seen on 06/15/19. She was hospitalized in December 2020 for Covid. Maintained on Stialto. Night time O2 dependent.  Last seen 10/2020.  3 month Follow up Pt. Presents for follow up. She states she is having worsening shortness of breath. She has a cough, and she is having more secretions than what is her baseline. Secretions are thinker than what is usual for her. She has been compliant with her Stialto daily. She is using her rescue in haler 3-4 times daily.  Sats when patient walked into the room were 88%. She did rebound quickly. She does wear oxygen to sleep. She states she has been having to wear it during the day also as of late. She states she has been wheezing. Usually in the morning. She denies fever, chest pain, orthopnea of hemoptysis   01/15/2021  Test Results: Pulmonary function testing: PFT's 10/19/14   FEV1 1.24 (57 % ) ratio 53  p 9 % improvement from saba p ?  prior to study with DLCO  38 % corrects to 52 % for alv volume    CBC Latest Ref Rng & Units 07/10/2019 07/09/2019 07/08/2019  WBC 4.0 - 10.5 K/uL 10.3 11.3(H) 9.2  Hemoglobin 12.0 - 15.0 g/dL 12.8 12.5 12.6  Hematocrit 36.0 - 46.0 % 37.8 37.9 36.8  Platelets 150 - 400 K/uL 411(H) 443(H) 336    BMP Latest Ref Rng & Units 07/10/2019 07/09/2019 07/08/2019  Glucose 70 - 99 mg/dL 221(H) 239(H) 262(H)  BUN 8 - 23 mg/dL 23 17 17   Creatinine 0.44 - 1.00 mg/dL 0.88  0.74 0.71  BUN/Creat Ratio 12 - 28 - - -  Sodium 135 - 145 mmol/L 139 139 139  Potassium 3.5 - 5.1 mmol/L 3.9 3.5 3.9  Chloride 98 - 111 mmol/L 98 99 101  CO2 22 - 32 mmol/L 26 28 25   Calcium 8.9 - 10.3 mg/dL 9.1 8.7(L) 9.0    BNP    Component Value Date/Time   BNP 51.6 07/06/2019 1630    ProBNP    Component Value Date/Time   PROBNP 17.0 10/04/2014 1003    PFT    Component Value Date/Time   FEV1PRE 0.96 12/14/2019 1443   FEV1POST 1.24 10/19/2014 1041   FVCPRE 1.94 12/14/2019 1443   FVCPOST 2.34 10/19/2014 1041   TLC 4.87 12/14/2019 1443   DLCOUNC 8.28 12/14/2019 1443   PREFEV1FVCRT 49 12/14/2019 1443   PSTFEV1FVCRT 53 10/19/2014 1041    No results found.   Past medical hx Past Medical History:  Diagnosis Date   Asthma    Chest pain 2014   Cath 10/14>>normal CA   Complete heart block (Erika Leach) 06/24/2012   HTN (hypertension)    poorly controlled   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     Social History   Tobacco Use   Smoking status: Former    Packs/day: 1.00    Years: 40.00    Pack years: 40.00  Types: Cigarettes    Quit date: 07/08/2012    Years since quitting: 8.5   Smokeless tobacco: Never  Vaping Use   Vaping Use: Never used  Substance Use Topics   Alcohol use: No    Alcohol/week: 0.0 standard drinks    Comment: rare - special occasions   Drug use: No    Erika Leach reports that she quit smoking about 8 years ago. Her smoking use included cigarettes. She has a 40.00 pack-year smoking history. She has never used smokeless tobacco. She reports that she does not drink alcohol and does not use drugs.  Tobacco Cessation: Erika Leach reports that she quit smoking about 8 years ago. Her smoking use included cigarettes. She has a 40.00 pack-year smoking history. She has never used smokeless tobacco. She reports that she does not drink alcohol and does not use drugs.   Tobacco Cessation: Former smoker quit 2014 with a 40 pack year smoking history   Past  surgical hx, Family hx, Social hx all reviewed.  Current Outpatient Medications on File Prior to Visit  Medication Sig   albuterol (PROVENTIL) (2.5 MG/3ML) 0.083% nebulizer solution Take 2.5 mg by nebulization every 6 (six) hours as needed for wheezing or shortness of breath.    albuterol (VENTOLIN HFA) 108 (90 Base) MCG/ACT inhaler Inhale 2 puffs into the lungs every 6 (six) hours as needed for wheezing or shortness of breath.    amLODipine (NORVASC) 10 MG tablet Take 1 tablet (10 mg total) by mouth daily.   ascorbic acid (VITAMIN C) 500 MG tablet Take 1 tablet (500 mg total) by mouth daily.   bisoprolol (ZEBETA) 5 MG tablet Take 1 tablet (5 mg total) by mouth daily.   cloNIDine (CATAPRES) 0.1 MG tablet Take 1 tablet (0.1 mg total) by mouth 2 (two) times daily.   clotrimazole (LOTRIMIN) 1 % cream Apply 1 application topically 2 (two) times daily.   clotrimazole (MYCELEX) 10 MG troche Take 1 tablet (10 mg total) by mouth 5 (five) times daily.   COVID-19 mRNA Vac-TriS, Pfizer, SUSP injection Inject into the muscle.   COVID-19 mRNA vaccine, Pfizer, 30 MCG/0.3ML injection INJECT AS DIRECTED   famotidine (PEPCID) 20 MG tablet One after supper   gabapentin (NEURONTIN) 300 MG capsule Take 1 capsule by mouth 3 (three) times daily.   glipiZIDE (GLUCOTROL) 5 MG tablet Take 5 mg by mouth daily before breakfast.   HYDROcodone-acetaminophen (NORCO/VICODIN) 5-325 MG tablet Take 1 tablet by mouth 3 (three) times daily as needed.   loratadine (CLARITIN) 10 MG tablet Take 1 tablet (10 mg total) by mouth daily.   lovastatin (MEVACOR) 20 MG tablet Take 20 mg by mouth every morning.    metFORMIN (GLUCOPHAGE) 500 MG tablet Take 500 mg by mouth 2 (two) times daily.   pantoprazole (PROTONIX) 40 MG tablet Take 1 tablet (40 mg total) by mouth daily. Take 30-60 min before first meal of the day   predniSONE (DELTASONE) 10 MG tablet Take  4 each am x 2 days,   2 each am x 2 days,  1 each am x 2 days and stop    terbinafine (LAMISIL) 250 MG tablet TAKE 1 TABLET BY MOUTH EVERY DAY   Tiotropium Bromide-Olodaterol (STIOLTO RESPIMAT) 2.5-2.5 MCG/ACT AERS INHALE 2 PUFFS BY MOUTH INTO THE LUNGS DAILY   No current facility-administered medications on file prior to visit.     No Known Allergies  Review Of Systems:  Constitutional:   No  weight loss, night sweats,  Fevers, chills, fatigue,  or  lassitude.  HEENT:   No headaches,  Difficulty swallowing,  Tooth/dental problems, or  Sore throat,                No sneezing, itching, ear ache, nasal congestion, post nasal drip,   CV:  No chest pain,  Orthopnea, PND, swelling in lower extremities, anasarca, dizziness, palpitations, syncope.   GI  No heartburn, indigestion, abdominal pain, nausea, vomiting, diarrhea, change in bowel habits, loss of appetite, bloody stools.   Resp: + shortness of breath with exertion or at rest.  + excess mucus, + productive cough,  No non-productive cough,  No coughing up of blood.  + change in color of mucus.  + wheezing.  No chest wall deformity  Skin: no rash or lesions.  GU: no dysuria, change in color of urine, no urgency or frequency.  No flank pain, no hematuria   MS:  No joint pain or swelling.  No decreased range of motion.  No back pain.  Psych:  No change in mood or affect. No depression or anxiety.  No memory loss.   Vital Signs BP 124/74 (BP Location: Right Arm, Cuff Size: Normal)   Pulse 81   Temp (!) 97.1 F (36.2 C) (Oral)   Ht 5\' 5"  (1.651 m)   Wt 195 lb (88.5 kg)   LMP  (LMP Unknown)   SpO2 90%   BMI 32.45 kg/m    Physical Exam:  General- No distress,  A&Ox3, pleasant ENT: No sinus tenderness, TM clear, pale nasal mucosa, no oral exudate,no post nasal drip, no LAN Cardiac: S1, S2, regular rate and rhythm, no murmur Chest: + Exp  wheeze/ No rales/ dullness; no accessory muscle use, no nasal flaring, no sternal retractions Abd.: Soft Non-tender, Nd, BS +, Body mass index is 32.45 kg/m.  Ext:  No clubbing cyanosis, edema Neuro:  normal strength, MAE x 4, A&O x 3 Skin: No rashes, warm and dry Psych: normal mood and behavior   Assessment/Plan  COPD exacerbation Plan We will treat your for a COPD flare. We will send in a prescription for Doxycycline 100 mg BID x 7 days.  Please take probiotic with antibiotic.  I like Culturelle, and this can be purchased over the counter at [place] or any drug store.  If you prefer you can eat Activia Yogurt. Use sun Block if in the sun.  We will also prescribe a prednisone taper. Prednisone taper; 10 mg tablets: 4 tabs x 2 days, 3 tabs x 2 days, 2 tabs x 2 days 1 tab x 2 days then stop.  We will try some samples of Breztri. Take 2 puffs twice daily. Rinse mouth after use.  Do not use Stialto while you are using the [employer].  Continue night time oxygen Follow up in 2 weeks to ensure you are better, and to see if you like Breztri. We will walk you at follow up. You will need follow up PFT's once you are better.  Call if you need [place] sooner.  Please contact office for sooner follow up if symptoms do not improve or worsen or seek emergency care    I spent 40 minutes dedicated to the care of this patient on the date of this encounter to include pre-visit review of records, face-to-face time with the patient discussing conditions above, post visit ordering of testing, clinical documentation with the electronic health record, making appropriate referrals as documented, and communicating necessary information to the patient's healthcare team.  Magdalen Spatz, NP 01/15/2021  10:36 AM

## 2021-01-18 ENCOUNTER — Ambulatory Visit
Admission: RE | Admit: 2021-01-18 | Discharge: 2021-01-18 | Disposition: A | Discharge status: Home / Self Care | Payer: Medicare Other | Source: Ambulatory Visit | Attending: Internal Medicine | Admitting: Internal Medicine

## 2021-01-18 ENCOUNTER — Other Ambulatory Visit: Payer: Self-pay

## 2021-01-18 DIAGNOSIS — Z1231 Encounter for screening mammogram for malignant neoplasm of breast: Secondary | ICD-10-CM

## 2021-01-21 ENCOUNTER — Other Ambulatory Visit: Payer: Self-pay | Admitting: Acute Care

## 2021-02-05 ENCOUNTER — Encounter: Payer: Self-pay | Admitting: Acute Care

## 2021-02-05 ENCOUNTER — Other Ambulatory Visit: Payer: Self-pay | Admitting: Acute Care

## 2021-02-05 ENCOUNTER — Ambulatory Visit (INDEPENDENT_AMBULATORY_CARE_PROVIDER_SITE_OTHER): Payer: Medicare Other | Admitting: Acute Care

## 2021-02-05 ENCOUNTER — Other Ambulatory Visit: Payer: Self-pay

## 2021-02-05 VITALS — BP 122/70 | HR 87 | Temp 97.2°F | Ht 65.0 in | Wt 192.8 lb

## 2021-02-05 DIAGNOSIS — J441 Chronic obstructive pulmonary disease with (acute) exacerbation: Secondary | ICD-10-CM

## 2021-02-05 DIAGNOSIS — G4734 Idiopathic sleep related nonobstructive alveolar hypoventilation: Secondary | ICD-10-CM | POA: Diagnosis not present

## 2021-02-05 MED ORDER — STIOLTO RESPIMAT 2.5-2.5 MCG/ACT IN AERS
INHALATION_SPRAY | RESPIRATORY_TRACT | 3 refills | Status: DC
Start: 2021-02-05 — End: 2021-08-22

## 2021-02-05 NOTE — Patient Instructions (Addendum)
It is good to see you today. We are so glad you are feeling better. Continue Stialto daily as you have been doing. Rinse mouth after use. Use your rescue inhaler as needed for shortness of breath or wheezing no more than 3 times daily. If you need your rescue more than 3 times in a day, call to be seen.  Note your daily symptoms > remember "red flags" for COPD:  Increase in cough, increase in sputum production, increase in shortness of breath or activity intolerance. If you notice these symptoms, please call to be seen.   Continue night time oxygen as you have been doing. We will schedule PFT's to assess your COPD  Follow up with Dr. Melvyn Novas or Judson Roch NP after PFT's Please contact office for sooner follow up if symptoms do not improve or worsen or seek emergency care

## 2021-02-05 NOTE — Addendum Note (Signed)
Addended by: Coralie Keens on: 02/05/2021 04:55 PM   Modules accepted: Orders

## 2021-02-05 NOTE — Progress Notes (Signed)
History of Present Illness Erika Leach is a 70 y.o. female former smoker ( Quit 2014 with a 40 pack year smoking history) with history of  Asthma as a child and COPD Gold III. She has been on inhalers since 2013, referred to pulmonary clinic 11/01/2015 by Dr Sheryle Hail with dx of GOLD III copd .She is followed by Dr. Melvyn Novas.   02/05/2021 Pt. Presents for follow up. She was seen in the office 01/15/2021 with a COPD Flare and some hypoxemia. She was treated with Doxycycline and a prednisone taper. She states she finished her Doxycycline and prednisone taper. She states he is better, back to baseline. She states her secretions are minimal and clear. She tried the [place], but she prefers her [place], so we will continue Stialto as she was doing prior to her flare. She denies any wheezing or cough. No purulent secretions.Her shortness of breath is better. We walked her today in the office and sats remained  96%. She has not had to use her rescue inhaler since we treated her for her flare. She is using her oxygen at 2 L  at bedtime. She denies any fever, chest pain, orthopnea or hemoptysis.   Test Results: PFT's        Home Sleep Study                     CBC Latest Ref Rng & Units 07/10/2019 07/09/2019 07/08/2019  WBC 4.0 - 10.5 K/uL 10.3 11.3(H) 9.2  Hemoglobin 12.0 - 15.0 g/dL 12.8 12.5 12.6  Hematocrit 36.0 - 46.0 % 37.8 37.9 36.8  Platelets 150 - 400 K/uL 411(H) 443(H) 336    BMP Latest Ref Rng & Units 07/10/2019 07/09/2019 07/08/2019  Glucose 70 - 99 mg/dL 221(H) 239(H) 262(H)  BUN 8 - 23 mg/dL '23 17 17  '$ Creatinine 0.44 - 1.00 mg/dL 0.88 0.74 0.71  BUN/Creat Ratio 12 - 28 - - -  Sodium 135 - 145 mmol/L 139 139 139  Potassium 3.5 - 5.1 mmol/L 3.9 3.5 3.9  Chloride 98 - 111 mmol/L 98 99 101  CO2 22 - 32 mmol/L '26 28 25  '$ Calcium 8.9 - 10.3 mg/dL 9.1 8.7(L) 9.0    BNP    Component Value Date/Time   BNP 51.6 07/06/2019 1630    ProBNP    Component Value Date/Time    PROBNP 17.0 10/04/2014 1003    PFT    Component Value Date/Time   FEV1PRE 0.96 12/14/2019 1443   FEV1POST 1.24 10/19/2014 1041   FVCPRE 1.94 12/14/2019 1443   FVCPOST 2.34 10/19/2014 1041   TLC 4.87 12/14/2019 1443   DLCOUNC 8.28 12/14/2019 1443   PREFEV1FVCRT 49 12/14/2019 1443   PSTFEV1FVCRT 53 10/19/2014 1041    MM 3D SCREEN BREAST BILATERAL  Result Date: 01/20/2021 CLINICAL DATA:  Screening. EXAM: DIGITAL SCREENING BILATERAL MAMMOGRAM WITH TOMOSYNTHESIS AND CAD TECHNIQUE: Bilateral screening digital craniocaudal and mediolateral oblique mammograms were obtained. Bilateral screening digital breast tomosynthesis was performed. The images were evaluated with computer-aided detection. COMPARISON:  Previous exam(s). ACR Breast Density Category a: The breast tissue is almost entirely fatty. FINDINGS: There are no findings suspicious for malignancy. IMPRESSION: No mammographic evidence of malignancy. A result letter of this screening mammogram will be mailed directly to the patient. RECOMMENDATION: Screening mammogram in one year. (Code:[id number]-B-01Y) BI-RADS CATEGORY  1: Negative. Electronically Signed   By: Ammie Ferrier M.D.   On: 01/20/2021 13:33     Past medical hx Past Medical History:  Diagnosis Date  Asthma    Chest pain 2014   Cath 10/14>>normal CA   Complete heart block ([place]) 06/24/2012   HTN (hypertension)    poorly controlled   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     Social History   Tobacco Use   Smoking status: Former    Packs/day: 1.00    Years: 40.00    Pack years: 40.00    Types: Cigarettes    Quit date: 07/08/2012    Years since quitting: 8.5   Smokeless tobacco: Never  Vaping Use   Vaping Use: Never used  Substance Use Topics   Alcohol use: No    Alcohol/week: 0.0 standard drinks    Comment: rare - special occasions   Drug use: No    Ms.Erika Leach reports that she quit smoking about 8 years ago. Her smoking use included cigarettes. She has a 40.00 pack-year  smoking history. She has never used smokeless tobacco. She reports that she does not drink alcohol and does not use drugs.  Tobacco Cessation: Former smoker quit 2014 with a 40 pack year smoking history   Past surgical hx, Family hx, Social hx all reviewed.  Current Outpatient Medications on File Prior to Visit  Medication Sig   albuterol (PROVENTIL) (2.5 MG/3ML) 0.083% nebulizer solution Take 2.5 mg by nebulization every 6 (six) hours as needed for wheezing or shortness of breath.    albuterol (VENTOLIN HFA) 108 (90 Base) MCG/ACT inhaler Inhale 2 puffs into the lungs every 6 (six) hours as needed for wheezing or shortness of breath.    amLODipine (NORVASC) 10 MG tablet Take 1 tablet (10 mg total) by mouth daily.   ascorbic acid (VITAMIN C) 500 MG tablet Take 1 tablet (500 mg total) by mouth daily.   bisoprolol (ZEBETA) 5 MG tablet Take 1 tablet (5 mg total) by mouth daily.   cloNIDine (CATAPRES) 0.1 MG tablet Take 1 tablet (0.1 mg total) by mouth 2 (two) times daily.   clotrimazole (LOTRIMIN) 1 % cream Apply 1 application topically 2 (two) times daily.   clotrimazole (MYCELEX) 10 MG troche Take 1 tablet (10 mg total) by mouth 5 (five) times daily.   COVID-19 mRNA Vac-TriS, Pfizer, SUSP injection Inject into the muscle.   COVID-19 mRNA vaccine, Pfizer, 30 MCG/0.3ML injection INJECT AS DIRECTED   doxycycline (VIBRA-TABS) 100 MG tablet Take 1 tablet (100 mg total) by mouth 2 (two) times daily.   famotidine (PEPCID) 20 MG tablet One after supper   gabapentin (NEURONTIN) 300 MG capsule Take 1 capsule by mouth 3 (three) times daily.   glipiZIDE (GLUCOTROL) 5 MG tablet Take 5 mg by mouth daily before breakfast.   HYDROcodone-acetaminophen (NORCO/VICODIN) 5-325 MG tablet Take 1 tablet by mouth 3 (three) times daily as needed.   loratadine (CLARITIN) 10 MG tablet Take 1 tablet (10 mg total) by mouth daily.   lovastatin (MEVACOR) 20 MG tablet Take 20 mg by mouth every morning.    metFORMIN  (GLUCOPHAGE) 500 MG tablet Take 500 mg by mouth 2 (two) times daily.   pantoprazole (PROTONIX) 40 MG tablet Take 1 tablet (40 mg total) by mouth daily. Take 30-60 min before first meal of the day   predniSONE (DELTASONE) 10 MG tablet Take 4 tabs x 2 days, then 3 x 2d, then 2 x 2d, then 1 x 2d and STOP   terbinafine (LAMISIL) 250 MG tablet TAKE 1 TABLET BY MOUTH EVERY DAY   No current facility-administered medications on file prior to visit.     No Known Allergies  Review Of Systems:  Constitutional:   No  weight loss, night sweats,  Fevers, chills, fatigue, or  lassitude.  HEENT:   No headaches,  Difficulty swallowing,  Tooth/dental problems, or  Sore throat,                No sneezing, itching, ear ache, nasal congestion, post nasal drip,   CV:  No chest pain,  Orthopnea, PND, swelling in lower extremities, anasarca, dizziness, palpitations, syncope.   GI  No heartburn, indigestion, abdominal pain, nausea, vomiting, diarrhea, change in bowel habits, loss of appetite, bloody stools.   Resp: No shortness of breath with exertion or at rest.  No excess mucus, no productive cough,  No non-productive cough,  No coughing up of blood.  No change in color of mucus.  No wheezing.  No chest wall deformity  Skin: no rash or lesions.  GU: no dysuria, change in color of urine, no urgency or frequency.  No flank pain, no hematuria   MS:  No joint pain or swelling.  No decreased range of motion.  No back pain.  Psych:  No change in mood or affect. No depression or anxiety.  No memory loss.   Vital Signs BP 122/70 (BP Location: Left Arm, Cuff Size: Normal)   Pulse 87   Temp (!) 97.2 F (36.2 C) (Oral)   Ht '5\' 5"'$  (1.651 m)   Wt 192 lb 12.8 oz (87.5 kg)   LMP  (LMP Unknown)   SpO2 96%   BMI 32.08 kg/m    Physical Exam:  General- No distress,  A&Ox3, pleasant ENT: No sinus tenderness, TM clear, pale nasal mucosa, no oral exudate,no post nasal drip, no LAN Cardiac: S1, S2, regular rate  and rhythm, no murmur Chest: No wheeze/ rales/ dullness; no accessory muscle use, no nasal flaring, no sternal retractions Abd.: Soft Non-tender, ND, BS +, Body mass index is 32.08 kg/m.  Ext: No clubbing cyanosis, edema Neuro:  normal strength, MAE x 4, A&O x 3, appropriate Skin: No rashes, warm and dry, no lesions Psych: normal mood and behavior   Assessment/Plan Resolved COPD flare/ exacerbation Nocturnal hypoxemia Plan We are so glad you are feeling better. Continue Stialto daily as you have been doing. Rinse mouth after use. Use your rescue inhaler as needed for shortness of breath or wheezing no more than 3 times daily. If you need your rescue more than 3 times in a day, call to be seen.  Note your daily symptoms > remember "red flags" for COPD:  Increase in cough, increase in sputum production, increase in shortness of breath or activity intolerance. If you notice these symptoms, please call to be seen.   Continue night time oxygen as you have been doing. We will schedule PFT's to assess your COPD  Follow up with Dr. Melvyn Novas or Judson Roch NP after PFT's Please contact office for sooner follow up if symptoms do not improve or worsen or seek emergency care   I spent 30 minutes dedicated to the care of this patient on the date of this encounter to include pre-visit review of records, face-to-face time with the patient discussing conditions above, post visit ordering of testing, clinical documentation with the electronic health record, making appropriate referrals as documented, and communicating necessary information to the patient's healthcare team.   Magdalen Spatz, NP 02/05/2021  12:15 PM

## 2021-02-06 ENCOUNTER — Other Ambulatory Visit: Payer: Self-pay | Admitting: *Deleted

## 2021-02-06 DIAGNOSIS — J449 Chronic obstructive pulmonary disease, unspecified: Secondary | ICD-10-CM

## 2021-02-07 ENCOUNTER — Ambulatory Visit (INDEPENDENT_AMBULATORY_CARE_PROVIDER_SITE_OTHER): Payer: Medicare Other | Admitting: Internal Medicine

## 2021-02-07 ENCOUNTER — Other Ambulatory Visit: Payer: Self-pay

## 2021-02-07 DIAGNOSIS — J449 Chronic obstructive pulmonary disease, unspecified: Secondary | ICD-10-CM

## 2021-02-07 LAB — PULMONARY FUNCTION TEST
DL/VA % pred: 58 %
DL/VA: 2.37 ml/min/mmHg/L
DLCO unc % pred: 46 %
DLCO unc: 9.61 ml/min/mmHg
FEF 25-75 Post: 0.63 L/sec
FEF 25-75 Pre: 0.39 L/sec
FEF2575-%Change-Post: 63 %
FEF2575-%Pred-Post: 34 %
FEF2575-%Pred-Pre: 21 %
FEV1-%Change-Post: 18 %
FEV1-%Pred-Post: 51 %
FEV1-%Pred-Pre: 43 %
FEV1-Post: 1.05 L
FEV1-Pre: 0.88 L
FEV1FVC-%Change-Post: 2 %
FEV1FVC-%Pred-Pre: 65 %
FEV6-%Change-Post: 16 %
FEV6-%Pred-Post: 80 %
FEV6-%Pred-Pre: 68 %
FEV6-Post: 2.01 L
FEV6-Pre: 1.73 L
FEV6FVC-%Change-Post: 0 %
FEV6FVC-%Pred-Post: 103 %
FEV6FVC-%Pred-Pre: 103 %
FVC-%Change-Post: 16 %
FVC-%Pred-Post: 77 %
FVC-%Pred-Pre: 66 %
FVC-Post: 2.01 L
FVC-Pre: 1.73 L
Post FEV1/FVC ratio: 52 %
Post FEV6/FVC ratio: 100 %
Pre FEV1/FVC ratio: 51 %
Pre FEV6/FVC Ratio: 100 %
RV % pred: 145 %
RV: 3.33 L
TLC % pred: 98 %
TLC: 5.25 L

## 2021-02-07 NOTE — Patient Instructions (Signed)
Full PFT performed today. °

## 2021-02-07 NOTE — Progress Notes (Signed)
Full PFT performed today. °

## 2021-02-19 ENCOUNTER — Encounter: Payer: Self-pay | Admitting: Acute Care

## 2021-02-19 ENCOUNTER — Other Ambulatory Visit: Payer: Self-pay

## 2021-02-19 ENCOUNTER — Ambulatory Visit (INDEPENDENT_AMBULATORY_CARE_PROVIDER_SITE_OTHER): Payer: Medicare Other | Admitting: Acute Care

## 2021-02-19 VITALS — BP 118/74 | HR 60 | Temp 97.3°F | Ht 65.0 in | Wt 192.8 lb

## 2021-02-19 DIAGNOSIS — J9611 Chronic respiratory failure with hypoxia: Secondary | ICD-10-CM | POA: Diagnosis not present

## 2021-02-19 DIAGNOSIS — Z87891 Personal history of nicotine dependence: Secondary | ICD-10-CM | POA: Diagnosis not present

## 2021-02-19 DIAGNOSIS — J449 Chronic obstructive pulmonary disease, unspecified: Secondary | ICD-10-CM | POA: Diagnosis not present

## 2021-02-19 DIAGNOSIS — G4734 Idiopathic sleep related nonobstructive alveolar hypoventilation: Secondary | ICD-10-CM

## 2021-02-19 MED ORDER — BREZTRI AEROSPHERE 160-9-4.8 MCG/ACT IN AERO: 2.0000 | INHALATION_SPRAY | Freq: Two times a day (BID) | RESPIRATORY_TRACT | 0 refills | Status: DC

## 2021-02-19 NOTE — Progress Notes (Signed)
History of Present Illness Erika Leach is a 70 y.o. female former smoker with COPD III. She is followed by Dr. Melvyn Novas.    02/19/2021 Pt. Presents for follow up. She states she is doing well. She states she has been compliant with her Stialto. Sats today were 89% on RA, which is concerning. She states he has been doing well despite this though. PFT's show severe obstruction . She is on Stialto as maintenance. She tried Trelegy, but she did not like it. She states she has not been wheezing. She did have a dry cough yesterday, but it has cleared up. No increase in secretions. She denies any chest tightness. She states she is compliant with her night time oxygen at 2 L [place]. She is in no distress. She states she uses her rescue inhaler maybe once or twice a week. Denies fever, chest pain, orthopnea or hemoptysis.   Test Results: PFT's             CBC Latest Ref Rng & Units 07/10/2019 07/09/2019 07/08/2019  WBC 4.0 - 10.5 K/uL 10.3 11.3(H) 9.2  Hemoglobin 12.0 - 15.0 g/dL 12.8 12.5 12.6  Hematocrit 36.0 - 46.0 % 37.8 37.9 36.8  Platelets 150 - 400 K/uL 411(H) 443(H) 336    BMP Latest Ref Rng & Units 07/10/2019 07/09/2019 07/08/2019  Glucose 70 - 99 mg/dL 221(H) 239(H) 262(H)  BUN 8 - 23 mg/dL '23 17 17  '$ Creatinine 0.44 - 1.00 mg/dL 0.88 0.74 0.71  BUN/Creat Ratio 12 - 28 - - -  Sodium 135 - 145 mmol/L 139 139 139  Potassium 3.5 - 5.1 mmol/L 3.9 3.5 3.9  Chloride 98 - 111 mmol/L 98 99 101  CO2 22 - 32 mmol/L '26 28 25  '$ Calcium 8.9 - 10.3 mg/dL 9.1 8.7(L) 9.0    BNP    Component Value Date/Time   BNP 51.6 07/06/2019 1630    ProBNP    Component Value Date/Time   PROBNP 17.0 10/04/2014 1003    PFT    Component Value Date/Time   FEV1PRE 0.88 02/07/2021 0952   FEV1POST 1.05 02/07/2021 0952   FVCPRE 1.73 02/07/2021 0952   FVCPOST 2.01 02/07/2021 0952   TLC 5.25 02/07/2021 0952   DLCOUNC 9.61 02/07/2021 0952   PREFEV1FVCRT 51 02/07/2021 0952   PSTFEV1FVCRT 52 02/07/2021 0952     No results found.   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 Her smoking use included cigarettes. She has a 40.00 pack-year smoking history. She has never used smokeless tobacco. She reports that she does not drink alcohol and does not use drugs.  Tobacco Cessation: Counseling given: Yes Former smoker quit 2015  Past  surgical hx, Family hx, Social hx all reviewed.  Current Outpatient Medications on File Prior to Visit  Medication Sig   albuterol (PROVENTIL) (2.5 MG/3ML) 0.083% nebulizer solution Take 2.5 mg by nebulization every 6 (six) hours as needed for wheezing or shortness of breath.    albuterol (VENTOLIN HFA) 108 (90 Base) MCG/ACT inhaler Inhale 2 puffs into the lungs every 6 (six) hours as needed for wheezing or shortness of breath.    amLODipine (NORVASC) 10 MG tablet Take 1 tablet (10 mg total) by mouth daily.   ascorbic acid (VITAMIN C) 500 MG tablet Take 1 tablet (500 mg total) by mouth daily.   bisoprolol (ZEBETA) 5 MG tablet Take 1 tablet (5 mg total) by mouth daily.   cloNIDine (CATAPRES) 0.1 MG tablet Take 1 tablet (0.1 mg total) by mouth 2 (two) times daily.   clotrimazole (LOTRIMIN) 1 % cream Apply 1 application topically 2 (two) times daily.   clotrimazole (MYCELEX) 10 MG troche Take 1 tablet (10 mg total) by mouth 5 (five) times  daily.   COVID-19 mRNA Vac-TriS, Pfizer, SUSP injection Inject into the muscle.   COVID-19 mRNA vaccine, Pfizer, 30 MCG/0.3ML injection INJECT AS DIRECTED   doxycycline (VIBRA-TABS) 100 MG tablet Take 1 tablet (100 mg total) by mouth 2 (two) times daily.   famotidine (PEPCID) 20 MG tablet One after supper   gabapentin (NEURONTIN) 300 MG capsule Take 1 capsule by mouth 3 (three) times daily.   glipiZIDE (GLUCOTROL) 5 MG tablet Take 5 mg by mouth daily before breakfast.   HYDROcodone-acetaminophen (NORCO/VICODIN) 5-325 MG tablet Take 1 tablet by mouth 3 (three) times daily as needed.   loratadine (CLARITIN) 10 MG tablet Take 1 tablet (10 mg total) by mouth daily.   lovastatin (MEVACOR) 20 MG tablet Take 20 mg by mouth every morning.    metFORMIN (GLUCOPHAGE) 500 MG tablet Take 500 mg by mouth 2 (two) times daily.   pantoprazole (PROTONIX) 40 MG tablet Take 1 tablet (40 mg total) by mouth daily. Take 30-60 min before first meal of the day   predniSONE (DELTASONE) 10 MG tablet Take 4 tabs x 2 days, then 3 x 2d, then 2 x 2d, then 1 x 2d and STOP   terbinafine (LAMISIL) 250 MG tablet TAKE 1 TABLET BY MOUTH EVERY DAY   Tiotropium Bromide-Olodaterol (STIOLTO RESPIMAT) 2.5-2.5 MCG/ACT AERS INHALE 2 PUFFS BY MOUTH INTO THE LUNGS DAILY   No current facility-administered medications on file prior to visit.     No Known Allergies  Review Of Systems:  Constitutional:   No  weight loss, night sweats,  Fevers, chills, fatigue, or  lassitude.  HEENT:   No headaches,  Difficulty swallowing,  Tooth/dental problems, or  Sore throat,                No sneezing, itching, ear ache, nasal congestion, post nasal drip,   CV:  No chest pain,  Orthopnea, PND, swelling in lower extremities, anasarca, dizziness, palpitations, syncope.   GI  No heartburn, indigestion, abdominal pain, nausea, vomiting, diarrhea, change in bowel habits, loss of appetite, bloody stools.   Resp: + shortness of breath with exertion none  at rest.  No excess mucus, no productive cough,  No non-productive cough,  No coughing up of blood.  No change in color of mucus.  No wheezing.  No chest wall deformity  Skin: no rash or lesions.  GU: no dysuria, change in color of urine, no urgency or frequency.  No flank pain,  no hematuria   MS:  No joint pain or swelling.  No decreased range of motion.  No back pain.  Psych:  No change in mood or affect. No depression or anxiety.  No memory loss.   Vital Signs BP 118/74 (BP Location: Left Arm, Cuff Size: Normal)   Pulse 60   Temp (!) 97.3 F (36.3 C) (Oral)   Ht '5\' 5"'$  (1.651 m)   Wt 192 lb 12.8 oz (87.5 kg)   LMP  (LMP Unknown)   SpO2 (!) 89%   BMI 32.08 kg/m    Physical Exam:  General- No distress,  A&Ox3, pleasant ENT: No sinus tenderness, TM clear, pale nasal mucosa, no oral exudate,no post nasal drip, no LAN Cardiac: S1, S2, regular rate and rhythm, no murmur Chest: No wheeze/ rales/ dullness; no accessory muscle use, no nasal flaring, no sternal retractions, diminished per bases Abd.: Soft Non-tender, ND, BS +, Body mass index is 32.08 kg/m.  Ext: No clubbing cyanosis, edema Neuro:  normal strength, MAE x 4, A&O x 3 Skin: No rashes, O lesions, warm and dry Psych: normal mood and behavior, very pleasant   Assessment/Plan COPD III Chronic Hypoxic Respiratory Failure Former smoker Plan Your PFT's do show severe obstruction with decreased diffusion capacity ( transport of oxygen and CO2 across the lung membranes) Slight improvement over your last set of PFT's (  12/2019) , but still severe obstructive disease.  Continue your Stialto 2 puffs daily as you have been doing. Rinse mouth after use.  We will try the [facility] inhaler. 2 puffs in the morning and 2 puffs in the evening. Rinse mouth after use.  Use this instead of your Stialto for a week to see if you like it. Use your albuterol as needed for breakthrough shortness of breath  Note your daily symptoms >  remember "red flags" for COPD:  Increase in cough, increase in sputum production, increase in shortness of breath or activity intolerance. If you notice these symptoms, please call to be seen.  Monitor oxygen levels and make sure they are always > than 88%.   Continue wearing your oxygen at 2 L [place] at bedtime, and as needed if your sats drop below 88% Video visit in 2 weeks to see if you had improvement with Breztri.  Please contact office for sooner follow up if symptoms do not improve or worsen or seek emergency care    I spent 30 minutes dedicated to the care of this patient on the date of this encounter to include pre-visit review of records, face-to-face time with the patient discussing conditions above, post visit ordering of testing, clinical documentation with the electronic health record, making appropriate referrals as documented, and communicating necessary information to the patient's healthcare team.    Magdalen Spatz, NP 02/19/2021  11:03 AM

## 2021-02-19 NOTE — Patient Instructions (Addendum)
It is good to see you today. Your PFT's do show severe obstruction with decreased diffusion capacity ( transport of oxygen and CO2 across the lung membranes)  Continue your Stialto 2 puffs daily as you have been doing. Rinse mouth after use.  We will try the [facility] inhaler. 2 puffs in the morning and 2 puffs in the evening. Rinse mouth after use.  Use this instead of your Stialto for a week to see if you like it. Call us if you would like [place] to send in a prescription.  Use your albuterol as needed for breakthrough shortness of breath  Note your daily symptoms > remember "red flags" for COPD:  Increase in cough, increase in sputum production, increase in shortness of breath or activity intolerance. If you notice these symptoms, please call to be seen.    Video visit in 2 weeks to see if you had improvement with Breztri.  Please contact office for sooner follow up if symptoms do not improve or worsen or seek emergency care

## 2021-02-19 NOTE — Addendum Note (Signed)
Addended byCoralie Keens on: 02/19/2021 05:07 PM   Modules accepted: Orders

## 2021-03-07 ENCOUNTER — Other Ambulatory Visit: Payer: Self-pay

## 2021-03-07 ENCOUNTER — Telehealth (INDEPENDENT_AMBULATORY_CARE_PROVIDER_SITE_OTHER): Payer: Medicare Other | Admitting: Acute Care

## 2021-03-07 ENCOUNTER — Encounter: Payer: Self-pay | Admitting: Acute Care

## 2021-03-07 DIAGNOSIS — J9611 Chronic respiratory failure with hypoxia: Secondary | ICD-10-CM | POA: Diagnosis not present

## 2021-03-07 DIAGNOSIS — J449 Chronic obstructive pulmonary disease, unspecified: Secondary | ICD-10-CM

## 2021-03-07 NOTE — Patient Instructions (Signed)
It is good to see you today Please stop using the Breztri Resume Stiolto 2 puffs daily Rinse mouth after use.  Use Rescue Albuterol as you have been doing for breakthrough wheezing or shortness of breath Note your daily symptoms > remember "red flags" for COPD:  Increase in cough, increase in sputum production, increase in shortness of breath or activity intolerance. If you notice these symptoms, please call to be seen.  Let us know if you would like mouthwash to help clear up the oral thrush.  Follow up in 3 months with Dr. Melvyn Novas Please contact office for sooner follow up if symptoms do not improve or worsen or seek emergency care

## 2021-03-07 NOTE — Progress Notes (Signed)
Virtual Visit via Video Note  I connected with Erika Leach on 03/07/21 at  3:00 PM EDT by a video enabled telemedicine application and verified that I am speaking with the correct person using two identifiers.  Location: Patient: At home Provider: [place], [place], [place], [address]    I discussed the limitations of evaluation and management by telemedicine and the availability of in person appointments. The patient expressed understanding and agreed to proceed.  Erika Leach is a 70 y.o. female former smoker with COPD III. She is followed by Dr. Melvyn Novas. She quit smoking 2014 with a 40 pack year smoking history.  Maintenance is Stiolto. Rescue is Albuterol  She has tried both Investment [employer], operational and does not like either as they give her oral thrush  History of Present Illness: Pt. Seen for follow up. She was seen in the office 8/15. She had experienced a slight drop in her oxygen saturations. She wanted to try Breztri to see if it helped with her occasional dyspnea. She was otherwise without fever, worsening cough or any change in secretions. She states today that she did not like the [employer]. It gave her oral thrush just like the Trelegy did. I offered mouthwash to help treat this but she deferred. She States it will clear up once she stops using it. She will resume her Stiolto 2 puffs daily. We discussed noting her  daily symptoms > encouraged her to remember "red flags" for COPD:  Increase in cough, increase in sputum production, increase in shortness of breath or activity intolerance. If she does  notice these symptoms, I have asked her to call to be seen.      Observations/Objective: Doing well, in no distress, no accessory muscle use, no nasal flaring, speaking in complete sentences, no audible wheezing. She was seated and she was not wearing her oxygen.   Assessment and Plan: COPD Chronic Respiratory Failure with Hypoxia Plan Please stop using the Breztri Resume  Stiolto 2 puffs daily Rinse mouth after use.  Use Rescue Albuterol as you have been doing for breakthrough wheezing or shortness of breath Note your daily symptoms > remember "red flags" for COPD:  Increase in cough, increase in sputum production, increase in shortness of breath or activity intolerance. If you notice these symptoms, please call to be seen.  Let us know if you would like mouthwash to help clear up the oral thrush.  Continue wearing oxygen at 2 L [place] with ambulation  Follow up in 3 months with Dr. Melvyn Novas Please contact office for sooner follow up if symptoms do not improve or worsen or seek emergency care      Follow Up Instructions: Follow up in 3 months with Dr. Melvyn Novas Call us if you need [place] sooner for any reason.    I discussed the assessment and treatment plan with the patient. The patient was provided an opportunity to ask questions and all were answered. The patient agreed with the plan and demonstrated an understanding of the instructions.   The patient was advised to call back or seek an in-person evaluation if the symptoms worsen or if the condition fails to improve as anticipated.  I spent 30 minutes dedicated to the care of this patient on the date of this encounter to include pre-visit review of records, non- face-to-face time  on a video visit with the patient discussing conditions above, post visit ordering of testing, clinical documentation with the electronic health record, making appropriate referrals as  documented, and communicating necessary information to the patient's healthcare team.   Magdalen Spatz, NP  03/07/2021

## 2021-03-13 ENCOUNTER — Ambulatory Visit (INDEPENDENT_AMBULATORY_CARE_PROVIDER_SITE_OTHER): Payer: Medicare Other

## 2021-03-13 DIAGNOSIS — I442 Atrioventricular block, complete: Secondary | ICD-10-CM

## 2021-03-13 LAB — CUP PACEART REMOTE DEVICE CHECK
Battery Impedance: 1429 Ohm
Battery Remaining Longevity: 44 mo
Battery Voltage: 2.76 V
Brady Statistic AP VP Percent: 75 %
Brady Statistic AP VS Percent: 1 %
Brady Statistic AS VP Percent: 16 %
Brady Statistic AS VS Percent: 8 %
Date Time Interrogation Session: 20220906122934
Implantable Lead Implant Date: 20131218
Implantable Lead Implant Date: 20131218
Implantable Lead Location: 753859
Implantable Lead Location: 753860
Implantable Lead Model: 5076
Implantable Lead Model: 5076
Implantable Pulse Generator Implant Date: 20131218
Lead Channel Impedance Value: 495 Ohm
Lead Channel Impedance Value: 533 Ohm
Lead Channel Pacing Threshold Amplitude: 0.625 V
Lead Channel Pacing Threshold Amplitude: 0.75 V
Lead Channel Pacing Threshold Pulse Width: 0.4 ms
Lead Channel Pacing Threshold Pulse Width: 0.4 ms
Lead Channel Setting Pacing Amplitude: 2 V
Lead Channel Setting Pacing Amplitude: 2.5 V
Lead Channel Setting Pacing Pulse Width: 0.4 ms
Lead Channel Setting Sensing Sensitivity: 2 mV

## 2021-03-22 NOTE — Progress Notes (Signed)
Remote pacemaker transmission.   

## 2021-03-23 ENCOUNTER — Other Ambulatory Visit: Payer: Self-pay | Admitting: Acute Care

## 2021-03-23 DIAGNOSIS — J441 Chronic obstructive pulmonary disease with (acute) exacerbation: Secondary | ICD-10-CM

## 2021-03-28 ENCOUNTER — Ambulatory Visit (INDEPENDENT_AMBULATORY_CARE_PROVIDER_SITE_OTHER): Payer: Medicare Other | Admitting: Acute Care

## 2021-03-28 ENCOUNTER — Other Ambulatory Visit: Payer: Self-pay | Admitting: Acute Care

## 2021-03-28 ENCOUNTER — Encounter: Payer: Self-pay | Admitting: Acute Care

## 2021-03-28 ENCOUNTER — Other Ambulatory Visit: Payer: Self-pay

## 2021-03-28 VITALS — BP 122/78 | HR 60 | Temp 97.5°F | Ht 65.0 in | Wt 193.8 lb

## 2021-03-28 DIAGNOSIS — J9611 Chronic respiratory failure with hypoxia: Secondary | ICD-10-CM

## 2021-03-28 DIAGNOSIS — J441 Chronic obstructive pulmonary disease with (acute) exacerbation: Secondary | ICD-10-CM

## 2021-03-28 MED ORDER — PREDNISONE 10 MG PO TABS: ORAL_TABLET | ORAL | 0 refills | Status: DC

## 2021-03-28 MED ORDER — DOXYCYCLINE HYCLATE 100 MG PO TABS: 100.0000 mg | ORAL_TABLET | Freq: Two times a day (BID) | ORAL | 0 refills | Status: DC

## 2021-03-28 NOTE — Progress Notes (Signed)
History of Present Illness Erika Leach is a 70 y.o. female former smoker with COPD III. She is followed by Dr. Melvyn Novas. She quit smoking 2014 with a 40 pack year smoking history.  Maintenance is Stiolto. 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 Presents for acute visit for COPD Flare. She state she has had coughing and wheezing and shortness of breath. She went out of town this last week, and symptoms started last Thursday. She has not taken a Covid test. This appointment was made by the front desk staff and not sent to Triage for evaluation. Using Stialto and albuterol inhaler, not neb. Non productive cough, purulent secretions  No fever, body aches or chills. Able to speak in full sentences. She states she is compliant with her Stialto and Albuterol  Test Results:  CBC Latest Ref Rng & Units 07/10/2019 07/09/2019 07/08/2019  WBC 4.0 - 10.5 K/uL 10.3 11.3(H) 9.2  Hemoglobin 12.0 - 15.0 g/dL 12.8 12.5 12.6  Hematocrit 36.0 - 46.0 % 37.8 37.9 36.8  Platelets 150 - 400 K/uL 411(H) 443(H) 336    BMP Latest Ref Rng & Units 07/10/2019 07/09/2019 07/08/2019  Glucose 70 - 99 mg/dL 221(H) 239(H) 262(H)  BUN 8 - 23 mg/dL 23 17 17   Creatinine 0.44 - 1.00 mg/dL 0.88 0.74 0.71  BUN/Creat Ratio 12 - 28 - - -  Sodium 135 - 145 mmol/L 139 139 139  Potassium 3.5 - 5.1 mmol/L 3.9 3.5 3.9  Chloride 98 - 111 mmol/L 98 99 101  CO2 22 - 32 mmol/L 26 28 25   Calcium 8.9 - 10.3 mg/dL 9.1 8.7(L) 9.0    BNP    Component Value Date/Time   BNP 51.6 07/06/2019 1630    ProBNP    Component Value Date/Time   PROBNP 17.0 10/04/2014 1003    PFT    Component Value Date/Time   FEV1PRE 0.88 02/07/2021 0952   FEV1POST 1.05 02/07/2021 0952   FVCPRE 1.73 02/07/2021 0952   FVCPOST 2.01 02/07/2021 0952   TLC 5.25 02/07/2021 0952   DLCOUNC 9.61 02/07/2021 0952   PREFEV1FVCRT 51  02/07/2021 0952   PSTFEV1FVCRT 52 02/07/2021 0952    CUP PACEART REMOTE DEVICE CHECK  Result Date: 03/13/2021 Scheduled remote reviewed. Normal device function.  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 Her smoking use included cigarettes. She has a 40.00 pack-year smoking history. She has never used smokeless tobacco. She reports that she does not drink alcohol and does  not use drugs.  Tobacco Cessation: Former smoker quit 2014 with a 40 pack year smoking history   Past surgical hx, Family hx, Social hx all reviewed.  Current Outpatient Medications on File Prior to Visit  Medication Sig   albuterol (PROVENTIL) (2.5 MG/3ML) 0.083% nebulizer solution Take 2.5 mg by nebulization every 6 (six) hours as needed for wheezing or shortness of breath.    albuterol (VENTOLIN HFA) 108 (90 Base) MCG/ACT inhaler Inhale 2 puffs into the lungs every 6 (six) hours as needed for wheezing or shortness of breath.    amLODipine (NORVASC) 10 MG tablet Take 1 tablet (10 mg total) by mouth daily.   ascorbic acid (VITAMIN C) 500 MG tablet Take 1 tablet (500 mg total) by mouth daily.   bisoprolol (ZEBETA) 5 MG tablet Take 1 tablet (5 mg total) by mouth daily.   cloNIDine (CATAPRES) 0.1 MG tablet Take 1 tablet (0.1 mg total) by mouth 2 (two) times daily.    clotrimazole (LOTRIMIN) 1 % cream Apply 1 application topically 2 (two) times daily.   clotrimazole (MYCELEX) 10 MG troche Take 1 tablet (10 mg total) by mouth 5 (five) times daily.   COVID-19 mRNA Vac-TriS, Pfizer, SUSP injection Inject into the muscle.   COVID-19 mRNA vaccine, Pfizer, 30 MCG/0.3ML injection INJECT AS DIRECTED   doxycycline (VIBRA-TABS) 100 MG tablet Take 1 tablet (100 mg total) by mouth 2 (two) times daily.   famotidine (PEPCID) 20 MG tablet One after supper   gabapentin (NEURONTIN) 300 MG capsule Take 1 capsule by mouth 3 (three) times daily.   glipiZIDE (GLUCOTROL) 5 MG tablet Take 5 mg by mouth daily before breakfast.   HYDROcodone-acetaminophen (NORCO/VICODIN) 5-325 MG tablet Take 1 tablet by mouth 3 (three) times daily as needed.   loratadine (CLARITIN) 10 MG tablet Take 1 tablet (10 mg total) by mouth daily.   lovastatin (MEVACOR) 20 MG tablet Take 20 mg by mouth every morning.    metFORMIN (GLUCOPHAGE) 500 MG tablet Take 500 mg by mouth 2 (two) times daily.   pantoprazole (PROTONIX) 40 MG tablet Take 1 tablet (40 mg total) by mouth daily. Take 30-60 min before first meal of the day   predniSONE (DELTASONE) 10 MG tablet Take 4 tabs x 2 days, then 3 x 2d, then 2 x 2d, then 1 x 2d and STOP   terbinafine (LAMISIL) 250 MG tablet TAKE 1 TABLET BY MOUTH EVERY DAY   Tiotropium Bromide-Olodaterol (STIOLTO RESPIMAT) 2.5-2.5 MCG/ACT AERS INHALE 2 PUFFS BY MOUTH INTO THE LUNGS DAILY   No current facility-administered medications on file prior to visit.     No Known Allergies  Review Of Systems:  Constitutional:   No  weight loss, night sweats,  Fevers, chills, fatigue, or  lassitude.  HEENT:   No headaches,  Difficulty swallowing,  Tooth/dental problems, or  Sore throat,                No sneezing, itching, ear ache, nasal congestion, post nasal drip,   CV:  No chest pain,  Orthopnea, PND, swelling in lower extremities, anasarca, dizziness, palpitations, syncope.   GI  No  heartburn, indigestion, abdominal pain, nausea, vomiting, diarrhea, change in bowel habits, loss of appetite, bloody stools.   Resp: + shortness of breath with exertion or at rest.  + excess mucus, + productive cough,  No non-productive cough,  No coughing up of blood.  + change in color of mucus.  + wheezing.  No chest wall deformity  Skin: no  rash or lesions.  GU: no dysuria, change in color of urine, no urgency or frequency.  No flank pain, no hematuria   MS:  No joint pain or swelling.  No decreased range of motion.  No back pain.  Psych:  No change in mood or affect. No depression or anxiety.  No memory loss.   Vital Signs BP 122/78 (BP Location: Right Arm, Cuff Size: Normal)   Pulse 60   Temp (!) 97.5 F (36.4 C) (Oral)   Ht 5\' 5"  (1.651 m)   Wt 193 lb 12.8 oz (87.9 kg)   LMP  (LMP Unknown)   SpO2 95%   BMI 32.25 kg/m    Physical Exam:  General- No distress,  A&Ox3, pleasant  ENT: No sinus tenderness, TM clear, pale nasal mucosa, no oral exudate,no post nasal drip, no LAN Cardiac: S1, S2, regular rate and rhythm, no murmur Chest: + wheeze/ no rales/ dullness; no accessory muscle use, no nasal flaring, no sternal retractions Abd.: Soft Non-tender, ND, BS +, Body mass index is 32.25 kg/m.  Ext: No clubbing cyanosis, edema Neuro:  normal strength, MAE x 4, A&O x 3 Skin: No rashes, warm and dry, no lesions  Psych: normal mood and behavior   Assessment/Plan  COPD Flare/ Acute on Chronic Respiratory Failure Home oxygen at 2 L [place] Plan We will send in a prescription for Doxycycline 100 mg twice daily x 7 days We will send in a prednisone taper Prednisone taper; 10 mg tablets: 4 tabs x 2 days, 3 tabs x 2 days, 2 tabs x 2 days 1 tab x 2 days then stop.  Continue Stialto daily as you have been doing. Use Albuterol nebs in the morning and in the evening while you are sick.  Mucinex 1200 mg in the morning with a full glass of water. Use your flutter valve several times  daily. Follow up Jakel Alphin NP in 3 weeks. Call if you get worse not better. Please take a Covid Test to ensure you are Covid negative.  If + give [place] a call.  We will consider flu vaccine at follow up. Please contact office for sooner follow up if symptoms do not improve or worsen or seek emergency care    I spent 35 minutes dedicated to the care of this patient on the date of this encounter to include pre-visit review of records, face-to-face time with the patient discussing conditions above, post visit ordering of testing, clinical documentation with the electronic health record, making appropriate referrals as documented, and communicating necessary information to the patient's healthcare team.   Magdalen Spatz, NP 03/28/2021  4:15 PM

## 2021-03-28 NOTE — Patient Instructions (Addendum)
It is good to see you today We will send in a prescription for Doxycycline 100 mg twice daily x 7 days We will send in a prednisone taper Prednisone taper; 10 mg tablets: 4 tabs x 2 days, 3 tabs x 2 days, 2 tabs x 2 days 1 tab x 2 days then stop.  Continue Stialto daily as you have been doing. Use Albuterol nebs in the morning and in the evening while you are sick.  Mucinex 1200 mg in the morning with a full glass of water. Use your flutter valve several times daily. Follow up Kerie Badger NP in 3 weeks. Call if you get worse not better. Please take a Covid Test to ensure you are Covid negative.  If + give [place] a call.  We will consider flu vaccine at follow up. Please contact office for sooner follow up if symptoms do not improve or worsen or seek emergency care

## 2021-03-30 ENCOUNTER — Other Ambulatory Visit: Payer: Self-pay | Admitting: Podiatry

## 2021-04-18 ENCOUNTER — Encounter: Payer: Self-pay | Admitting: Acute Care

## 2021-04-18 ENCOUNTER — Other Ambulatory Visit: Payer: Self-pay

## 2021-04-18 ENCOUNTER — Ambulatory Visit (INDEPENDENT_AMBULATORY_CARE_PROVIDER_SITE_OTHER): Payer: Medicare Other | Admitting: Acute Care

## 2021-04-18 VITALS — BP 110/70 | HR 60 | Temp 98.2°F | Ht 65.0 in | Wt 190.5 lb

## 2021-04-18 DIAGNOSIS — Z23 Encounter for immunization: Secondary | ICD-10-CM

## 2021-04-18 DIAGNOSIS — J449 Chronic obstructive pulmonary disease, unspecified: Secondary | ICD-10-CM | POA: Diagnosis not present

## 2021-04-18 NOTE — Progress Notes (Signed)
History of Present Illness Erika Leach is a 69 y.o. female former smoker ( Quit 2014 with a 40 pack year smoking history)  with COPD III. She is followed by Dr. Melvyn Leach. She quit smoking 2014 with a 40 pack year smoking history.  Maintenance is Stiolto. Rescue is Albuterol  She has tried both Investment banker, operational and does not like either as they give her oral thrush She does wear oxygen at 2 L [place] with ambulation.   04/18/2021 Pt. Was seen in the office 03/28/2021 for a COPD Flare. She was treated with prednisone and Doxycycline . Of note she did not present to the office today with her oxygen. Saturations were 83% without her oxygen. We placed her on our tank, and she immediately rebounded to 97%. Pt. States she did take her Doxycycline  and prednisone taper. She states she  feels much better. She has less cough, secretions are clear. She states her wheezing is better. She has not had to use her rescue inhaler as much. ( About every other day). She is not having to use the albuterol nebs in the morning.  No fever, chest pain, orthopnea or hemoptysis.   Test Results:   CBC Latest Ref Rng & Units 07/10/2019 07/09/2019 07/08/2019  WBC 4.0 - 10.5 K/uL 10.3 11.3(H) 9.2  Hemoglobin 12.0 - 15.0 g/dL 12.8 12.5 12.6  Hematocrit 36.0 - 46.0 % 37.8 37.9 36.8  Platelets 150 - 400 K/uL 411(H) 443(H) 336    BMP Latest Ref Rng & Units 07/10/2019 07/09/2019 07/08/2019  Glucose 70 - 99 mg/dL 221(H) 239(H) 262(H)  BUN 8 - 23 mg/dL 23 17 17   Creatinine 0.44 - 1.00 mg/dL 0.88 0.74 0.71  BUN/Creat Ratio 12 - 28 - - -  Sodium 135 - 145 mmol/L 139 139 139  Potassium 3.5 - 5.1 mmol/L 3.9 3.5 3.9  Chloride 98 - 111 mmol/L 98 99 101  CO2 22 - 32 mmol/L 26 28 25   Calcium 8.9 - 10.3 mg/dL 9.1 8.7(L) 9.0    BNP    Component Value Date/Time   BNP 51.6 07/06/2019 1630    ProBNP    Component Value Date/Time   PROBNP 17.0 10/04/2014 1003    PFT    Component Value Date/Time   FEV1PRE 0.88 02/07/2021 0952    FEV1POST 1.05 02/07/2021 0952   FVCPRE 1.73 02/07/2021 0952   FVCPOST 2.01 02/07/2021 0952   TLC 5.25 02/07/2021 0952   DLCOUNC 9.61 02/07/2021 0952   PREFEV1FVCRT 51 02/07/2021 0952   PSTFEV1FVCRT 52 02/07/2021 0952    No results found.   Past medical hx Past Medical History:  Diagnosis Date   Asthma    Chest pain 2014   Cath 10/14>>normal CA   Complete heart block ([place]) 06/24/2012   HTN (hypertension)    poorly controlled   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     Social History   Tobacco Use   Smoking status: Former    Packs/day: 1.00    Years: 40.00    Pack years: 40.00    Types: Cigarettes    Quit date: 07/08/2012    Years since quitting: 8.7   Smokeless tobacco: Never  Vaping Use   Vaping Use: Never used  Substance Use Topics   Alcohol use: No    Alcohol/week: 0.0 standard drinks    Comment: rare - special occasions   Drug use: No    Erika Leach reports that she quit smoking about 8 years ago. Her smoking use included cigarettes. She  has a 40.00 pack-year smoking history. She has never used smokeless tobacco. She reports that she does not drink alcohol and does not use drugs.  Tobacco Cessation: Former smoker , quit 2014 with a 40 pack year smoking history.   Past surgical hx, Family hx, Social hx all reviewed.  Current Outpatient Medications on File Prior to Visit  Medication Sig   albuterol (PROVENTIL) (2.5 MG/3ML) 0.083% nebulizer solution Take 2.5 mg by nebulization every 6 (six) hours as needed for wheezing or shortness of breath.    albuterol (VENTOLIN HFA) 108 (90 Base) MCG/ACT inhaler Inhale 2 puffs into the lungs every 6 (six) hours as needed for wheezing or shortness of breath.    amLODipine (NORVASC) 10 MG tablet Take 1 tablet (10 mg total) by mouth daily.   ascorbic acid (VITAMIN C) 500 MG tablet Take 1 tablet (500 mg total) by mouth daily.   bisoprolol (ZEBETA) 5 MG tablet Take 1 tablet (5 mg total) by mouth daily.   cloNIDine (CATAPRES) 0.1 MG  tablet Take 1 tablet (0.1 mg total) by mouth 2 (two) times daily.   clotrimazole (LOTRIMIN) 1 % cream Apply 1 application topically 2 (two) times daily.   clotrimazole (MYCELEX) 10 MG troche Take 1 tablet (10 mg total) by mouth 5 (five) times daily.   COVID-19 mRNA Vac-TriS, Pfizer, SUSP injection Inject into the muscle.   COVID-19 mRNA vaccine, Pfizer, 30 MCG/0.3ML injection INJECT AS DIRECTED   doxycycline (VIBRA-TABS) 100 MG tablet Take 1 tablet (100 mg total) by mouth 2 (two) times daily.   famotidine (PEPCID) 20 MG tablet One after supper   gabapentin (NEURONTIN) 300 MG capsule Take 1 capsule by mouth 3 (three) times daily.   glipiZIDE (GLUCOTROL) 5 MG tablet Take 5 mg by mouth daily before breakfast.   HYDROcodone-acetaminophen (NORCO/VICODIN) 5-325 MG tablet Take 1 tablet by mouth 3 (three) times daily as needed.   loratadine (CLARITIN) 10 MG tablet Take 1 tablet (10 mg total) by mouth daily.   lovastatin (MEVACOR) 20 MG tablet Take 20 mg by mouth every morning.    metFORMIN (GLUCOPHAGE) 500 MG tablet Take 500 mg by mouth 2 (two) times daily.   pantoprazole (PROTONIX) 40 MG tablet Take 1 tablet (40 mg total) by mouth daily. Take 30-60 min before first meal of the day   predniSONE (DELTASONE) 10 MG tablet Take 4 tabs x 2 days, then 3 x 2d, then 2 x 2d, then 1 x 2d and STOP   terbinafine (LAMISIL) 250 MG tablet TAKE 1 TABLET BY MOUTH EVERY DAY   Tiotropium Bromide-Olodaterol (STIOLTO RESPIMAT) 2.5-2.5 MCG/ACT AERS INHALE 2 PUFFS BY MOUTH INTO THE LUNGS DAILY   No current facility-administered medications on file prior to visit.     No Known Allergies  Review Of Systems:  Constitutional:   No  weight loss, night sweats,  Fevers, chills, fatigue, or  lassitude.  HEENT:   No headaches,  Difficulty swallowing,  Tooth/dental problems, or  Sore throat,                No sneezing, itching, ear ache, nasal congestion, post nasal drip,   CV:  No chest pain,  Orthopnea, PND, swelling in  lower extremities, anasarca, dizziness, palpitations, syncope.   GI  No heartburn, indigestion, abdominal pain, nausea, vomiting, diarrhea, change in bowel habits, loss of appetite, bloody stools.   Resp: + shortness of breath with exertion less at rest.  No excess mucus, no productive cough,  No non-productive cough,  No coughing up of blood.  No change in color of mucus.  No wheezing.  No chest wall deformity  Skin: no rash or lesions.  GU: no dysuria, change in color of urine, no urgency or frequency.  No flank pain, no hematuria   MS:  No joint pain or swelling.  No decreased range of motion.  No back pain.  Psych:  No change in mood or affect. No depression or anxiety.  No memory loss.   Vital Signs BP 110/70 (BP Location: Right Arm, Patient Position: Sitting, Cuff Size: Large)   Pulse 60   Temp 98.2 F (36.8 C) (Oral)   Ht 5\' 5"  (1.651 m)   Wt 190 lb 8 oz (86.4 kg)   LMP  (LMP Unknown)   SpO2 (!) 83% Comment: with 2L of O2 up to 98%  BMI 31.70 kg/m    Physical Exam:  General- No distress,  A&Ox 3, pleasant ENT: No sinus tenderness, TM clear, pale nasal mucosa, no oral exudate,no post nasal drip, no LAN Cardiac: S1, S2, regular rate and rhythm, no murmur Chest: No wheeze/ rales/ dullness; no accessory muscle use, no nasal flaring, no sternal retractions, diminished per bases Abd.: Soft Non-tender, ND, BS +, Body mass index is 31.7 kg/m.  Ext: No clubbing cyanosis, edema Neuro:  normal strength, MAE x 4, A&O x 3 Skin: No rashes, warm and dry, No lesions Psych: normal mood and behavior   Assessment/Plan COPD Flare >> Resolved Chronic Hypoxic respiratory failure Plan Continue Stialto daily as you have been doing. Mucinex 1200 mg in the morning with a full glass of water. Use your flutter valve several times daily. Follow up with Dr. Melvyn Leach or Judson Roch NP in 3 months. Flu vaccine today. Wear your oxygen at 2 L [place] with exertion. Saturation reading should never be below  88%.  Low oxygen levels can be bad for your heart and your brain.  Please contact office for sooner follow up if symptoms do not improve or worsen or seek emergency care   Note your daily symptoms > remember "red flags" for COPD:  Increase in cough, increase in sputum production, increase in shortness of breath or activity intolerance. If you notice these symptoms, please call to be seen.    Call if you need [place] sooner.   I spent 30 minutes dedicated to the care of this patient on the date of this encounter to include pre-visit review of records, face-to-face time with the patient discussing conditions above, post visit ordering of testing, clinical documentation with the electronic health record, making appropriate referrals as documented, and communicating necessary information to the patient's healthcare team.     Magdalen Spatz, NP 04/18/2021  3:56 PM

## 2021-04-18 NOTE — Patient Instructions (Addendum)
It Is good to see you today Continue Stialto daily as you have been doing. Mucinex 1200 mg in the morning with a full glass of water. Use your flutter valve several times daily. Follow up with Dr. Melvyn Novas or Judson Roch NP in 3 months. Flu vaccine today. Wear your oxygen at 2 L [place] with exertion. Saturation reading should never be below 88%.  Please contact office for sooner follow up if symptoms do not improve or worsen or seek emergency care   Note your daily symptoms > remember "red flags" for COPD:  Increase in cough, increase in sputum production, increase in shortness of breath or activity intolerance. If you notice these symptoms, please call to be seen.    Call if you need [place] sooner.

## 2021-04-22 ENCOUNTER — Other Ambulatory Visit: Payer: Self-pay | Admitting: Podiatry

## 2021-05-16 ENCOUNTER — Ambulatory Visit: Payer: Medicare Other | Attending: Internal Medicine

## 2021-05-16 ENCOUNTER — Ambulatory Visit (INDEPENDENT_AMBULATORY_CARE_PROVIDER_SITE_OTHER): Payer: Medicare Other | Admitting: Podiatry

## 2021-05-16 ENCOUNTER — Other Ambulatory Visit (HOSPITAL_BASED_OUTPATIENT_CLINIC_OR_DEPARTMENT_OTHER): Payer: Self-pay

## 2021-05-16 ENCOUNTER — Other Ambulatory Visit: Payer: Self-pay

## 2021-05-16 DIAGNOSIS — M79674 Pain in right toe(s): Secondary | ICD-10-CM

## 2021-05-16 DIAGNOSIS — Z794 Long term (current) use of insulin: Secondary | ICD-10-CM | POA: Diagnosis not present

## 2021-05-16 DIAGNOSIS — B351 Tinea unguium: Secondary | ICD-10-CM

## 2021-05-16 DIAGNOSIS — Z23 Encounter for immunization: Secondary | ICD-10-CM

## 2021-05-16 DIAGNOSIS — M79675 Pain in left toe(s): Secondary | ICD-10-CM | POA: Diagnosis not present

## 2021-05-16 DIAGNOSIS — E1169 Type 2 diabetes mellitus with other specified complication: Secondary | ICD-10-CM

## 2021-05-16 MED ORDER — PFIZER COVID-19 VAC BIVALENT 30 MCG/0.3ML IM SUSP
INTRAMUSCULAR | 0 refills | Status: DC
  Filled 2021-05-16: qty 0.3, 1d supply, fill #0

## 2021-05-16 NOTE — Progress Notes (Signed)
   Covid-19 Vaccination Clinic  Name:  Erika Leach    MRN: [id number] DOB: 01/20/1951  05/16/2021  [name] was observed post Covid-19 immunization for 15 minutes without incident. She was provided with Vaccine Information Sheet and instruction to access the V-Safe system.   [name] was instructed to call 911 with any severe reactions post vaccine: Difficulty breathing  Swelling of face and throat  A fast heartbeat  A bad rash all over body  Dizziness and weakness   Immunizations Administered     Name Date Dose VIS Date Route   Pfizer Covid-19 Vaccine Bivalent Booster 05/16/2021 11:42 AM 0.3 mL 03/07/2021 Intramuscular   Manufacturer: [place]   Lot: [id number]   [place]: [phone]

## 2021-05-18 NOTE — Progress Notes (Signed)
  Subjective:  Patient ID: Erika Leach, female    DOB: [birth year],  MRN: [id number]  Chief Complaint  Patient presents with   Diabetes    Diabetic nail care    70 y.o. female returns for the above complaint.  Patient presents with thickened elongated dystrophic toenails x10.  Pain on palpation.  She would like to have the debrided she is not able to do it herself.  She is try medication to help with it which has not helped much.  She denies any other acute complaints.  Objective:  There were no vitals filed for this visit. Podiatric Exam: Vascular: dorsalis pedis and posterior tibial pulses are palpable bilateral. Capillary return is immediate. Temperature gradient is WNL. Skin turgor WNL  Sensorium: Normal Semmes Weinstein monofilament test. Normal tactile sensation bilaterally. Nail Exam: Pt has thick disfigured discolored nails with subungual debris noted bilateral entire nail hallux through fifth toenails.  Pain on palpation to the nails. Ulcer Exam: There is no evidence of ulcer or pre-ulcerative changes or infection. Orthopedic Exam: Muscle tone and strength are WNL. No limitations in general ROM. No crepitus or effusions noted.  Skin: No Porokeratosis. No infection or ulcers    Assessment & Plan:   1. Pain due to onychomycosis of toenails of both feet   2. Type 2 diabetes mellitus with other specified complication, with long-term current use of insulin ([place])     Patient was evaluated and treated and all questions answered.  Onychomycosis with pain  -Nails palliatively debrided as below. -Educated on self-care  Procedure: Nail Debridement Rationale: pain  Type of Debridement: manual, sharp debridement. Instrumentation: Nail nipper, rotary burr. Number of Nails: 10  Procedures and Treatment: Consent by patient was obtained for treatment procedures. The patient understood the discussion of treatment and procedures well. All questions were answered thoroughly reviewed.  Debridement of mycotic and hypertrophic toenails, 1 through 5 bilateral and clearing of subungual debris. No ulceration, no infection noted.  Return Visit-Office Procedure: Patient instructed to return to the office for a follow up visit 3 months for continued evaluation and treatment.  Boneta Lucks, DPM    No follow-ups on file.

## 2021-05-21 ENCOUNTER — Other Ambulatory Visit: Payer: Self-pay

## 2021-05-21 ENCOUNTER — Encounter: Payer: Self-pay | Admitting: Internal Medicine

## 2021-05-21 ENCOUNTER — Ambulatory Visit (INDEPENDENT_AMBULATORY_CARE_PROVIDER_SITE_OTHER): Payer: Medicare Other | Admitting: Internal Medicine

## 2021-05-21 DIAGNOSIS — J9611 Chronic respiratory failure with hypoxia: Secondary | ICD-10-CM | POA: Diagnosis not present

## 2021-05-21 DIAGNOSIS — J449 Chronic obstructive pulmonary disease, unspecified: Secondary | ICD-10-CM | POA: Diagnosis not present

## 2021-05-21 MED ORDER — PREDNISONE 10 MG PO TABS: ORAL_TABLET | ORAL | 5 refills | Status: DC

## 2021-05-21 NOTE — Assessment & Plan Note (Signed)
New start on outpt 02 03/20/2019 p d/c from admit  04/01/2019 Patient Saturations on Room Air at Rest = 96% >>> Room Air while Ambulating = 88% and on  2 Liters of pulsed oxygen while Ambulating = 93% - pt request change to cyclinders only 07/10/2020    Again advised: Make sure you check your oxygen saturation  AT  your highest level of activity (not after you stop)   to be sure it stays over 90% and adjust  02 flow upward to maintain this level if needed but remember to turn it back to previous settings when you stop (to conserve your supply).          Each maintenance medication was reviewed in detail including emphasizing most importantly the difference between maintenance and prns and under what circumstances the prns are to be triggered using an action plan format where appropriate.  Total time for H and P, chart review, counseling, reviewing hfa/dpi/02 device(s) and generating customized AVS unique to this office visit / same day charting = 25 min

## 2021-05-21 NOTE — Progress Notes (Signed)
Subjective:   Patient ID: Erika Leach, female    DOB: November 05, 1950, 70 y.o.   MRN: [id number]    Brief patient profile:  58 yobf  Quit smoking 2014 @ wt 165 with h/o childhood asthma outgrew by [facility] with onset breathing problems during pregnancies but no maint rx then worse 2013 /  and on inhalers ever since so referred to pulmonary clinic 11/01/2015 by Dr Sheryle Hail with dx of GOLD III copd 10/2014 .    History of Present Illness  11/01/2015 1st [facility] office visit/ Stephan Draughn   Chief Complaint  Patient presents with   Pulmonary Consult    Referred by Dr. Antonietta Jewel. Pt c/o SOB x 4 yrs- "all the time" with or without any exertion. She also c/o prod cough with white sputum.    usually no cough first thing then as stir around starts cough/  some coughing also  at bedtime but doesn't keep her up, cough drops work the best/ so severe she gags >  proair helps for a few hours then bad again, no better on qvar so dc'd. Can lose breath sitting with / without cough / gag On Lisinopril since quit smoking and pattern has persisted daily since then even on pred which "just makes her fat, doesn't really help sob or cough" rec Stop lisiopril and start valsartan 160/12.5 one daily and your cough should gradually resolve over several weeks  Only use your albuterol as a rescue medication  GERD    Please schedule a follow up office visit in 4 weeks, sooner if needed to address any and all of  the rest of your respiratory symptoms that don't resolve off lisinopril (it's the only way to sort this out)    11/29/2015  f/u ov/Verlaine Embry re: acei cough gone/  GOLD II copd with group B symptoms Chief Complaint  Patient presents with   Follow-up    Breathing is unchanged. Her cough is much better. She is using albuterol 1-2 x daily on average.   MMRC2 = can't walk a nl pace on a flat grade s sob but does fine slow and flat eg [employer]  rec Plan A = Automatic = Bevespi Take 2 puffs first thing in am and then another 2  puffs about 12 hours later.  Plan B = Backup Only use your albuterol (proair) as a rescue medication     Admit date: 03/20/2019 Discharge date: 03/24/2019   Admitted From: Home Disposition:  Home    Recommendations for Outpatient Follow-up and new medication changes:  Follow up with Dr. Sheryle Hail in 7 days.  Patient received 5 days of systemic steroids. Continue bronchodilator at home with beta 2 agonist and inhaled corticosteroid.  Will add long acting anticholinergic with tiotropium.  Patient likely with chronic hypoxic respiratory failure, oxygen desaturation down to 85% on ambulation.    Home Health: no   Equipment/Devices: Home 02    Discharge Condition: stable  CODE STATUS: full  Diet recommendation: heart healthy and diabetic prudent.    Brief/Interim Summary: 70 year old female who presented with dyspnea.  She does have significant past medical history for COPD/asthma, diastolic heart failure, complete heart block status post pacemaker and hypertension.  Patient reported 3 days of persistent dyspnea and wheezing.  Symptoms were refractive to outpatient therapy with bronchodilators.  On her initial physical examination her temperature was 98.5, blood pressure 145/73, heart rate 66, respiratory rate 30, oxygen saturation 97%.  She had decreased air entry bilaterally with marked expiratory wheezing, heart  S1-S2 present and rhythmic, the abdomen soft no lower extremity edema. Sodium 142, potassium 3.8, chloride 101, bicarb 30, glucose 96, BUN 15, creatinine 0.95, white count 9.3, hemoglobin 14.8, hematocrit 46.1, platelets 265.  SARS COVID-19 was negative.  Her chest radiograph had hyperinflation with increased lung markings bilaterally.  EKG 60 bpm, atrial and ventricular paced rhythm, no significant ST segment or T wave changes.   Patient was admitted to the hospital with a working diagnosis of COPD exacerbation.   1.  Acute COPD exacerbation with acute on chronic hypoxic respiratory  failure/pulmonary hypertension.  Patient was admitted to the medical ward, she received systemic steroids, aggressive bronchodilators and supplemental oxygen per nasal cannula.  Her symptoms improved, her oxygen saturation is 96% on 1 L per nasal cannula, no significant wheezing or dyspnea.  Patient will be discharged home to continue bronchodilator (albuterol/ symbicort).  Received 5 doses of systemic steroids while hospitalized.   2.  Diastolic heart failure/status post pacer.    remained stable with no signs of exacerbation.   3.  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  Patient received insulin sliding scale along with pre-meal insulin for glucose control.  Her hemoglobin A1c 7.8 consistent with uncontrolled diabetes mellitus. Continue glipizide and metformin.    4.  Hypertension.  Continue blood pressure control with amlodipine, clonidine and  Atenolol.    5.  Morbid obesity with dyslipidemia..  Her calculated BMI is 32.3, she will need outpatient follow-up. Continue lovastatin.      Discharge Diagnoses:  Principal Problem:   COPD exacerbation ([place]) Active Problems:   Essential hypertension   Mild pulmonary hypertension ([facility])   Pacemaker-[employer]   OSA (obstructive sleep apnea)   Morbid obesity ([place])   Acute respiratory failure ([facility])   Hypoxemia      Discharge Instructions    Allergies as of 03/24/2019   No Known Allergies                Medication List         STOP taking these medications       amoxicillin 500 MG capsule Commonly known as: AMOXIL               TAKE these medications       amLODipine 10 MG tablet Commonly known as: NORVASC Take 10 mg by mouth daily.    atenolol 50 MG tablet Commonly known as: TENORMIN Take 50 mg by mouth 2 (two) times daily.    cloNIDine 0.1 MG tablet Commonly known as: CATAPRES Take 0.1 mg by mouth 2 (two) times daily.    gabapentin 300 MG capsule Commonly known as: NEURONTIN Take 1 capsule by  mouth 3 (three) times daily.    glipiZIDE 5 MG tablet Commonly known as: GLUCOTROL Take 5 mg by mouth daily before breakfast.    lovastatin 20 MG tablet Commonly known as: MEVACOR Take 20 mg by mouth at bedtime.    metFORMIN 500 MG tablet Commonly known as: GLUCOPHAGE Take 500 mg by mouth 2 (two) times daily.    albuterol (2.5 MG/3ML) 0.083% nebulizer solution Commonly known as: PROVENTIL Take 2.5 mg by nebulization every 6 (six) hours as needed for wheezing or shortness of breath.    ProAir HFA 108 (90 Base) MCG/ACT inhaler Generic drug: albuterol Inhale 2 puffs into the lungs every 6 (six) hours as needed for wheezing or shortness of breath.    Spiriva HandiHaler 18 MCG inhalation capsule Generic drug: tiotropium Place 1 capsule (18 mcg  total) into inhaler and inhale daily.    Symbicort 160-4.5 MCG/ACT inhaler Generic drug: budesonide-formoterol Inhale 2 puffs into the lungs 2 (two) times daily.    traMADol 50 MG tablet Commonly known as: ULTRAM Take 50-100 mg by mouth daily as needed for pain.        04/01/2019  Merelyn Klump re: re-establish  GOLD II copd criteria, now 02 dep Cc  Worse sob  While on symbicort for a year or more and did not contact this office Post hosp with aecopd  Chief Complaint  Patient presents with   Pulmonary Consult    Pt here to re-establish care for COPD. She was seen here last 11/29/2015.  She was admiitted to [facility] recently for COPD exacerbation 03-20-19-03-24-19.  She states her breathing is doing well today as long as she is using her o2. She is using her proair and neb both 4 x per day.   Dyspnea:  Very sedentary since d/c  Cough: better since started on 02  Sleeping: on side bed is flat. One pillow  SABA use: way over using  02: 1 lpm 24/7  rec Plan A = Automatic = Always=    symbicort 160 x 2 puffs and then spiriva x 2 puffs then symbicort 160 x 2 puffs x 12 hour later Work on inhaler technique:  Plan B = Backup (to supplement plan A, not to  replace it) Only use your albuterol inhaler as a rescue medication Plan C = Crisis (instead of Plan B but only if Plan B stops working) - only use your albuterol nebulizer if you first try Plan B Plan D = Deltasone  = if not getting better > Prednisone 10 mg take  4 each am x 2 days,   2 each am x 2 days,  1 each am x 2 days and stop  Stop atenolol and start bisoprolol 5 mg one daily on 04/02/19  Adjust 02 to keep sats > 90%    05/03/2019  f/u ov/Yoali Conry re:  Gold II Spirometry  but 02 dep after most recent hosp d/c now maint on symb/spiriva did not need pred but still developed thrush  Chief Complaint  Patient presents with   Follow-up    Breathing is doing well. She has had cough with clear to cream colored sputum. She is using her proair about 2 x per wk and she has not used neb.    Dyspnea:  Up the street and back s 02 and sats stayed above 90% but not doing this often Cough: none  Sleeping: on side one pillow/ bed flat  SABA use: rarely 02: hs 2lpm and has portable  rec Decrease the symbicort to 80 Take 2 puffs first thing in am and then another 2 puffs about 12 hours later.  Work on inhaler technique:  Diflucan 100mg  daily x 3 days and stop mevacor x 3 days  Goal is to keep 02 sats above 90% at all time - continue to use 2lpm at bedtime when you can't be monitoring your 02 levels   06/15/2019  f/u ov/Ameerah Huffstetler re: GOLD II/ maint on symb 80/spiriva with limited techinique Chief Complaint  Patient presents with   Follow-up    Breathing has been worse just since today- she started coughing yesterday- prod with cream color sputum. She is using her albuterol inhaler 2-3 x per day and she has not used her neb.   Dyspnea:  Ok until one day prior to OV  With increaesed cough /  congested  Cough: thick creamy esp ina m  Sleeping: fine on side one pillow  SABA use: just started using  one day prior to OV   02: 2lpm hs/ has portable not using  rec Work on inhaler   12/14/2019  f/u ov/Hezzie Karim re:  GOLD III copd / fully vaccinated for covid 19  Dyspnea:  Better esp in a/c eg does  shopping fine  Cough: not a lot  Sleeping: fine flat bed / one pillow on side  SABA use: daily  02: only at hs  rec Make sure you check your oxygen saturations   Stay on stiolto  2 pffs each am  Try albuterol 15 min before an activity      03/20/2020  f/u ov/Newton Frutiger re: GOLD II stiolto maint/c/o mouth sore (really means corners of lips) Chief Complaint  Patient presents with   Follow-up     reports "doing good"; states stiolto is working but she'd like to try something else.  Dyspnea:  Much better now able to do [employer] walking   Cough: none  Sleeping: able to lie flat / one pillow SABA use: maybe twice daily  02: 2lpm hs / never checks daytime  Rec Stiolto  1-2 puffs each am  Only use your albuterol as a rescue medication  Try albuterol 15 min before an activity clotrimazole cream  for for lips and and troche for your mouth as needed  Please schedule a follow up visit in 3 months but call sooner if needed    Week prior to xmas 2021 > amox/ prednisone    07/10/2020  f/u ov/Shalla Bulluck re:  GOLD II/ 02 dep at 2lpm  On stiolto 2 each am  Chief Complaint  Patient presents with   Follow-up    SOB  Dyspnea:  [employer] does fine/ hc parking  Cough: improving p flare while on pred, can't tol ICS = thrush Sleeping: bed is flat/ one pillow SABA use: last few days much less / but baseline rarely hfa nebulizer 02: 2lpm hs only and cannot afford the 02 to run it, wants tanks Rec Plan A = Automatic = Always=    Stiolto 2 pffs first thing in am Plan B = Backup (to supplement plan A, not to replace it) Only use your albuterol inhaler as a rescue medication  Plan C = Crisis (instead of Plan B but only if Plan B stops working) - only use your albuterol nebulizer if you first try Plan B  Plan D = Deltasone  = if not getting better > Prednisone 10 mg take  4 each am x 2 days,   2 each am x 2 days,  1 each am x 2 days  and stop  Plan E = ER if all else fails  For cough > mucinex dm 1200  Mg twice daily  Pantoprazole (protonix) 40 mg   Take  30-60 min before first meal of the day and Pepcid (famotidine)  20 mg one after supper for at  Least 6 weeks GERD  rx    04/18/21 Groce NP recs Continue Stiolto daily as you have been doing. Mucinex 1200 mg in the morning with a full glass of water. Use your flutter valve several times daily. Wear your oxygen at 2 L [place] with exertion.    05/21/2021  f/u ov/Kytzia Gienger re: GOLD 3/ 02 dep at hs and prn maint on stiolto   Chief Complaint  Patient presents with   Follow-up    3 mo  f/u. States she has had one flair up since last visit but she is back to feeling better.   Dyspnea:  worse than prior but not really walking  Cough: none Sleeping: flat bed one pillow SABA use: not much/last prednisone was one month prior to OV   02: 2lpm hs and prn daytime p ex Covid status:  vax  x 3/ infected original wave in spring of 2020    No obvious day to day or daytime variability or assoc excess/ purulent sputum or mucus plugs or hemoptysis or cp or chest tightness, subjective wheeze or overt sinus or hb symptoms.   Sleeping as above without nocturnal  or early am exacerbation  of respiratory  c/o's or need for noct saba. Also denies any obvious fluctuation of symptoms with weather or environmental changes or other aggravating or alleviating factors except as outlined above   No unusual exposure hx or h/o childhood pna/ asthma or knowledge of premature birth.  Current Allergies, Complete Past Medical History, Past Surgical History, Family History, and Social History were reviewed in [employer] record.  ROS  The following are not active complaints unless bolded Hoarseness, sore throat, dysphagia, dental problems, itching, sneezing,  nasal congestion or discharge of excess mucus or purulent secretions, ear ache,   fever, chills, sweats, unintended wt loss or wt  gain, classically pleuritic or exertional cp,  orthopnea pnd or arm/hand swelling  or leg swelling, presyncope, palpitations, abdominal pain, anorexia, nausea, vomiting, diarrhea  or change in bowel habits or change in bladder habits, change in stools or change in urine, dysuria, hematuria,  rash, arthralgias, visual complaints, headache, numbness, weakness or ataxia or problems with walking or coordination,  change in mood or  memory.        Current Meds  Medication Sig   albuterol (PROVENTIL) (2.5 MG/3ML) 0.083% nebulizer solution Take 2.5 mg by nebulization every 6 (six) hours as needed for wheezing or shortness of breath.    albuterol (VENTOLIN HFA) 108 (90 Base) MCG/ACT inhaler Inhale 2 puffs into the lungs every 6 (six) hours as needed for wheezing or shortness of breath.    amLODipine (NORVASC) 10 MG tablet Take 1 tablet (10 mg total) by mouth daily.   ascorbic acid (VITAMIN C) 500 MG tablet Take 1 tablet (500 mg total) by mouth daily.   bisoprolol (ZEBETA) 5 MG tablet Take 1 tablet (5 mg total) by mouth daily.   cloNIDine (CATAPRES) 0.1 MG tablet Take 1 tablet (0.1 mg total) by mouth 2 (two) times daily.   clotrimazole (LOTRIMIN) 1 % cream Apply 1 application topically 2 (two) times daily.   clotrimazole (MYCELEX) 10 MG troche Take 1 tablet (10 mg total) by mouth 5 (five) times daily.   COVID-19 mRNA bivalent vaccine, Pfizer, (PFIZER COVID-19 VAC BIVALENT) injection Inject into the muscle.   COVID-19 mRNA Vac-TriS, Pfizer, SUSP injection Inject into the muscle.   famotidine (PEPCID) 20 MG tablet One after supper   gabapentin (NEURONTIN) 300 MG capsule Take 1 capsule by mouth 3 (three) times daily.   glipiZIDE (GLUCOTROL) 5 MG tablet Take 5 mg by mouth daily before breakfast.   HYDROcodone-acetaminophen (NORCO/VICODIN) 5-325 MG tablet Take 1 tablet by mouth 3 (three) times daily as needed.   loratadine (CLARITIN) 10 MG tablet Take 1 tablet (10 mg total) by mouth daily.   lovastatin  (MEVACOR) 20 MG tablet Take 20 mg by mouth every morning.    metFORMIN (GLUCOPHAGE) 500 MG tablet Take 500 mg by  mouth 2 (two) times daily.   pantoprazole (PROTONIX) 40 MG tablet Take 1 tablet (40 mg total) by mouth daily. Take 30-60 min before first meal of the day   terbinafine (LAMISIL) 250 MG tablet TAKE 1 TABLET BY MOUTH EVERY DAY   Tiotropium Bromide-Olodaterol (STIOLTO RESPIMAT) 2.5-2.5 MCG/ACT AERS INHALE 2 PUFFS BY MOUTH INTO THE LUNGS DAILY                   Objective:   Physical Exam    05/21/2021      187  07/10/2020          187  03/20/2020       188  12/14/2019         197  06/15/2019       198  05/03/2019     196  04/01/2019       196   11/29/2015       200   11/01/15 200 lb 12.8 oz (91.082 kg)  02/14/15 197 lb (89.359 kg)  11/23/14 193 lb 9.6 oz (87.816 kg)    Vital signs reviewed  05/21/2021  - Note at rest 02 sats  94% on RA   General appearance:    amb bf nad  HEENT : pt wearing mask not removed for exam due to covid - 19 concerns.   NECK :  without JVD/Nodes/TM/ nl carotid upstrokes bilaterally   LUNGS: no acc muscle use,  Min barrel  contour chest wall with bilateral  slightly decreased bs s audible wheeze and  without cough on insp or exp maneuvers and min  Hyperresonant  to  percussion bilaterally     CV:  RRR  no s3 or murmur or increase in P2, and no edema   ABD:  soft and nontender with pos end  insp Hoover's  in the supine position. No bruits or organomegaly appreciated, bowel sounds nl  MS:   Nl gait/  ext warm without deformities, calf tenderness, cyanosis or clubbing No obvious joint restrictions   SKIN: warm and dry without lesions    NEURO:  alert, approp, nl sensorium with  no motor or cerebellar deficits apparent.             Assessment & Plan:

## 2021-05-21 NOTE — Assessment & Plan Note (Signed)
Quit smoking 2014  PFT's 10/19/14   FEV1 1.24 (57 % ) ratio 53  p 9 % improvement from saba p ?  prior to study with DLCO  38 % corrects to 52 % for alv volume   - 11/29/2015    try bevespi  - 03/20/2019 admit with aecopd with Eos 1.5  - 04/01/2019    changed spiriva to respimat/ continue symb 160 2bid - 04/01/2019 pred as plan d x 6 days only   - 05/03/19 changed symb to 80 due to thrush  - PFT's  12/14/2019  FEV1 0.96 (46 % ) ratio 0.49  p saba w/in one hour prior to study with DLCO  8.28 (39%) corrects to 2.16 (53%)  for alv volume and FV curve classic concave  - 03/20/2020  After extensive coaching inhaler device,  effectiveness =    75% smi > continue stiolto but use 1 puff each am instead of 2 (strongly doubt it's causing oral candidiasis)  -07/10/2020  refilled prednisone x 6 days cycles only and added gerd rx  - 04/18/21  Added flutter valve     Group D in terms of symptom/risk and laba/lama/ICS  therefore appropriate rx at this point >>>  Continue trelegy with plan D = pred x 6 days only  And approp saba  Re SABA :  I spent extra time with pt today reviewing appropriate use of albuterol for prn use on exertion with the following points: 1) saba is for relief of sob that does not improve by walking a slower pace or resting but rather if the pt does not improve after trying this first. 2) If the pt is convinced, as many are, that saba helps recover from activity faster then it's easy to tell if this is the case by re-challenging : ie stop, take the inhaler, then p 5 minutes try the exact same activity (intensity of workload) that just caused the symptoms and see if they are substantially diminished or not after saba 3) if there is an activity that reproducibly causes the symptoms, try the saba 15 min before the activity on alternate days   If in fact the saba really does help, then fine to continue to use it prn but advised may need to look closer at the maintenance regimen being used to achieve  better control of airways disease with exertion.

## 2021-05-21 NOTE — Patient Instructions (Addendum)
No change in medications   Please schedule a follow up visit in 3 months but call sooner if needed - ok to schedule with Eric Form NP who can address Low dose screening CT at that time

## 2021-06-04 ENCOUNTER — Other Ambulatory Visit: Payer: Self-pay | Admitting: Internal Medicine

## 2021-06-12 ENCOUNTER — Ambulatory Visit (INDEPENDENT_AMBULATORY_CARE_PROVIDER_SITE_OTHER): Payer: Medicare Other

## 2021-06-12 DIAGNOSIS — I442 Atrioventricular block, complete: Secondary | ICD-10-CM

## 2021-06-13 LAB — CUP PACEART REMOTE DEVICE CHECK
Battery Impedance: 1540 Ohm
Battery Remaining Longevity: 42 mo
Battery Voltage: 2.76 V
Brady Statistic AP VP Percent: 75 %
Brady Statistic AP VS Percent: 1 %
Brady Statistic AS VP Percent: 16 %
Brady Statistic AS VS Percent: 8 %
Date Time Interrogation Session: 20221207113629
Implantable Lead Implant Date: 20131218
Implantable Lead Implant Date: 20131218
Implantable Lead Location: 753859
Implantable Lead Location: 753860
Implantable Lead Model: 5076
Implantable Lead Model: 5076
Implantable Pulse Generator Implant Date: 20131218
Lead Channel Impedance Value: 524 Ohm
Lead Channel Impedance Value: 533 Ohm
Lead Channel Pacing Threshold Amplitude: 0.625 V
Lead Channel Pacing Threshold Amplitude: 0.75 V
Lead Channel Pacing Threshold Pulse Width: 0.4 ms
Lead Channel Pacing Threshold Pulse Width: 0.4 ms
Lead Channel Setting Pacing Amplitude: 2 V
Lead Channel Setting Pacing Amplitude: 2.5 V
Lead Channel Setting Pacing Pulse Width: 0.4 ms
Lead Channel Setting Sensing Sensitivity: 2 mV

## 2021-06-21 NOTE — Progress Notes (Signed)
Remote pacemaker transmission.   

## 2021-07-20 ENCOUNTER — Ambulatory Visit: Payer: Medicare Other | Admitting: Internal Medicine

## 2021-08-02 ENCOUNTER — Other Ambulatory Visit: Payer: Self-pay | Admitting: Internal Medicine

## 2021-08-02 DIAGNOSIS — J449 Chronic obstructive pulmonary disease, unspecified: Secondary | ICD-10-CM

## 2021-08-02 NOTE — Telephone Encounter (Signed)
Please advise patient requesting refill °

## 2021-08-02 NOTE — Telephone Encounter (Signed)
Ok to refill, be sure keep ov with Judson Roch groce as planned and me w/in 3 months with all meds in hand

## 2021-08-22 ENCOUNTER — Other Ambulatory Visit: Payer: Self-pay

## 2021-08-22 ENCOUNTER — Encounter: Payer: Self-pay | Admitting: Nurse Practitioner

## 2021-08-22 ENCOUNTER — Ambulatory Visit: Payer: Medicare Other | Admitting: Acute Care

## 2021-08-22 ENCOUNTER — Ambulatory Visit (INDEPENDENT_AMBULATORY_CARE_PROVIDER_SITE_OTHER): Payer: Medicare Other | Admitting: Nurse Practitioner

## 2021-08-22 ENCOUNTER — Ambulatory Visit (INDEPENDENT_AMBULATORY_CARE_PROVIDER_SITE_OTHER): Payer: Medicare Other

## 2021-08-22 VITALS — BP 122/70 | HR 62 | Temp 98.6°F | Ht 65.0 in | Wt 180.0 lb

## 2021-08-22 DIAGNOSIS — J9611 Chronic respiratory failure with hypoxia: Secondary | ICD-10-CM

## 2021-08-22 DIAGNOSIS — J441 Chronic obstructive pulmonary disease with (acute) exacerbation: Secondary | ICD-10-CM

## 2021-08-22 MED ORDER — TRELEGY ELLIPTA 100-62.5-25 MCG/ACT IN AEPB: 1.0000 | INHALATION_SPRAY | Freq: Every day | RESPIRATORY_TRACT | 6 refills | Status: DC

## 2021-08-22 MED ORDER — TRELEGY ELLIPTA 100-62.5-25 MCG/ACT IN AEPB: 1.0000 | INHALATION_SPRAY | Freq: Every day | RESPIRATORY_TRACT | 0 refills | Status: DC

## 2021-08-22 MED ORDER — DOXYCYCLINE HYCLATE 100 MG PO TABS: 100.0000 mg | ORAL_TABLET | Freq: Two times a day (BID) | ORAL | 0 refills | Status: DC

## 2021-08-22 MED ORDER — PREDNISONE 10 MG PO TABS: ORAL_TABLET | ORAL | 0 refills | Status: DC

## 2021-08-22 NOTE — Patient Instructions (Addendum)
-  Continue Stiolto 2 puffs daily  -Continue Albuterol inhaler 2 puffs or 3 mL neb every 6 hours as needed for shortness of breath or wheezing. Notify if symptoms persist despite rescue inhaler/neb use. -Continue claritin 10 mg daily for allergies -Continue supplemental oxygen 2 lpm at night and as needed with activity to maintain oxygen saturation >88-90%  -Start Trelegy 1 puff daily. Brush tongue and rinse mouth afterwards. -Extend prednisone taper. Two more days of 20 mg then decrease to 10 mg for 3 days then 5 mg for 3 days then stop. Take in AM with food. -Doxycycline 100 mg Twice daily for 7 days.  Finish your antibiotics in their entirety. Do not stop just because symptoms improve.   Chest x ray today. We will notify you of any abnormal results.   Notify if worsening breathlessness, cough, mucus production, fatigue, or wheezing occurs.   Follow up in one month with Dr. Melvyn Novas or Alanson Aly. If symptoms do not improve or worsen, please contact office for sooner follow up or seek emergency care.

## 2021-08-22 NOTE — Assessment & Plan Note (Addendum)
Slowly improving AECOPD. Will extend prednisone taper and add doxy course d/t productive cough. CXR today clear. She has been treated with prednisone 3 times for flares since October; also had a significant BD response on most recent PFTs. Will step up to triple therapy inhaler with trial of Trelegy.   Patient Instructions  -Continue Stiolto 2 puffs daily  -Continue Albuterol inhaler 2 puffs or 3 mL neb every 6 hours as needed for shortness of breath or wheezing. Notify if symptoms persist despite rescue inhaler/neb use. -Continue claritin 10 mg daily for allergies -Continue supplemental oxygen 2 lpm at night and as needed with activity to maintain oxygen saturation >88-90%  -Start Trelegy 1 puff daily. Brush tongue and rinse mouth afterwards. -Extend prednisone taper. Two more days of 20 mg then decrease to 10 mg for 3 days then 5 mg for 3 days then stop. Take in AM with food. -Doxycycline 100 mg Twice daily for 7 days.  Finish your antibiotics in their entirety. Do not stop just because symptoms improve.   Chest x ray today. We will notify you of any abnormal results.   Notify if worsening breathlessness, cough, mucus production, fatigue, or wheezing occurs.   Follow up in one month with Dr. Melvyn Novas or Alanson Aly. If symptoms do not improve or worsen, please contact office for sooner follow up or seek emergency care.

## 2021-08-22 NOTE — Progress Notes (Signed)
@Patient  ID: Erika Leach, female    DOB: 09-10-1950, 71 y.o.   MRN: [id number]  Chief Complaint  Patient presents with   Follow-up    She reports that she is having shortness of breath at rest and with exertion,. X 2 weeks. She reports that her episodes are getting more frequent.     Referring provider: Antonietta Jewel, MD  HPI: 71 year old, former smoker (40 pack years) followed for COPD, chronic respiratory failure. She is a patient of Dr. Gustavus Bryant and last seen in office on 05/21/2021. Past medical history significant for complete heart block s/p pacemaker, HTN, mild pulmonary HTN, DM, obesity, HLD, h/o childhood asthma, OSA (followed by cardiology).   TEST/EVENTS:  10/19/2014 PFTs: FEV1 1.24 (57), ratio 53, DLCOcor 52%. No BD. 02/07/2021 PFTs: FVC 2.01 (77), FEV1 1.05 (51), ratio 52, TLC 98%, RV 145%, DLCOunc 46%. 18% BD change  05/21/2021: OV with Dr. Melvyn Novas. Flare last month, back to feeling better at this OV. Maintained on Stiolto. Continued 2 lpm hs and PRN with exertion.  08/22/2021: Today - acute visit Patient presents today with daughter for worsening shortness of breath over the past week or two. She reports having increased DOE, chest tightness and wheezing. She normally only wears her oxygen at night but has been using it more during the day. Her sats have been staying around 90% on room air. She does have an occasional productive cough with white to yellow sputum production. She denies fevers, URI symptoms, orthopnea, PND, chest pain or lower extremity swelling. She continues on Stiolto daily and has been requiring her rescue inhaler more frequently. She was called in a prednisone taper, which she is on day 6 of, and feels some improvement but feels like it usually works faster.   No Known Allergies  Immunization History  Administered Date(s) Administered   Fluad Quad(high Dose 65+) 04/18/2021   Influenza, High Dose Seasonal PF 05/21/2018, 04/12/2019   PFIZER Comirnaty(Gray  Top)Covid-19 Tri-Sucrose Vaccine 12/06/2020   PFIZER(Purple Top)SARS-COV-2 Vaccination 08/13/2019, 09/03/2019, 05/03/2020   Pfizer Covid-19 Vaccine Bivalent Booster 39yrs & up 05/16/2021   Zoster Recombinat (Shingrix) 04/12/2019    Past Medical History:  Diagnosis Date   Asthma    Chest pain 2014   Cath 10/14>>normal CA   Complete heart block ([place]) 06/24/2012   HTN (hypertension)    poorly controlled   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    Tobacco History: Social History   Tobacco Use  Smoking Status Former   Packs/day: 1.00   Years: 40.00   Pack years: 40.00   Types: Cigarettes   Quit date: 07/08/2012   Years since quitting: 9.1  Smokeless Tobacco Never   Counseling given: Not Answered   Outpatient Medications Prior to Visit  Medication Sig Dispense Refill   albuterol (PROVENTIL) (2.5 MG/3ML) 0.083% nebulizer solution Take 2.5 mg by nebulization every 6 (six) hours as needed for wheezing or shortness of breath.      albuterol (VENTOLIN HFA) 108 (90 Base) MCG/ACT inhaler Inhale 2 puffs into the lungs every 6 (six) hours as needed for wheezing or shortness of breath.      amLODipine (NORVASC) 10 MG tablet Take 1 tablet (10 mg total) by mouth daily.  3   ascorbic acid (VITAMIN C) 500 MG tablet Take 1 tablet (500 mg total) by mouth daily.     bisoprolol (ZEBETA) 5 MG tablet Take 1 tablet (5 mg total) by mouth daily. 90 tablet 2   cloNIDine (CATAPRES) 0.1 MG tablet Take  1 tablet (0.1 mg total) by mouth 2 (two) times daily. 60 tablet 0   clotrimazole (LOTRIMIN) 1 % cream Apply 1 application topically 2 (two) times daily. 30 g 11   clotrimazole (MYCELEX) 10 MG troche Take 1 tablet (10 mg total) by mouth 5 (five) times daily. 35 Troche 11   famotidine (PEPCID) 20 MG tablet TAKE 1 TABLET BY MOUTH AFTER SUPPER 90 tablet 3   gabapentin (NEURONTIN) 300 MG capsule Take 1 capsule by mouth 3 (three) times daily.  11   glipiZIDE (GLUCOTROL) 5 MG tablet Take 5 mg by mouth daily before breakfast.      HYDROcodone-acetaminophen (NORCO/VICODIN) 5-325 MG tablet Take 1 tablet by mouth 3 (three) times daily as needed.     loratadine (CLARITIN) 10 MG tablet Take 1 tablet (10 mg total) by mouth daily. 30 tablet 1   lovastatin (MEVACOR) 20 MG tablet Take 20 mg by mouth every morning.      metFORMIN (GLUCOPHAGE) 500 MG tablet Take 500 mg by mouth 2 (two) times daily.  11   pantoprazole (PROTONIX) 40 MG tablet Take 1 tablet (40 mg total) by mouth daily. Take 30-60 min before first meal of the day 30 tablet 2   terbinafine (LAMISIL) 250 MG tablet TAKE 1 TABLET BY MOUTH EVERY DAY 90 tablet 0   Tiotropium Bromide-Olodaterol (STIOLTO RESPIMAT) 2.5-2.5 MCG/ACT AERS INHALE 2 PUFFS BY MOUTH INTO THE LUNGS DAILY 12 g 3   COVID-19 mRNA bivalent vaccine, Pfizer, (PFIZER COVID-19 VAC BIVALENT) injection Inject into the muscle. (Patient not taking: Reported on 08/22/2021) 0.3 mL 0   COVID-19 mRNA Vac-TriS, Pfizer, SUSP injection Inject into the muscle. (Patient not taking: Reported on 08/22/2021) 0.3 mL 0   predniSONE (DELTASONE) 10 MG tablet TAKE 4 EACH AM X 2 DAYS, 2 EACH AM X 2 DAYS, 1 EACH AM X 2 DAYS AND STOP (Patient not taking: Reported on 08/22/2021) 14 tablet 3   No facility-administered medications prior to visit.     Review of Systems:   Constitutional: No weight loss or gain, night sweats, fevers, chills, fatigue, or lassitude. HEENT: No headaches, difficulty swallowing, tooth/dental problems, or sore throat. No sneezing, itching, ear ache, nasal congestion, or post nasal drip CV:  No chest pain, orthopnea, PND, swelling in lower extremities, anasarca, dizziness, palpitations, syncope Resp: +shortness of breath with exertion; wheezing; chest tightness; productive cough.  No hemoptysis. No chest wall deformity GI:  No heartburn, indigestion, abdominal pain, nausea, vomiting, diarrhea, change in bowel habits, loss of appetite, bloody stools.  GU: No dysuria, change in color of urine, urgency or  frequency.  No flank pain, no hematuria  Skin: No rash, lesions, ulcerations MSK:  No joint pain or swelling.  No decreased range of motion.  No back pain. Neuro: No dizziness or lightheadedness.  Psych: No depression or anxiety. Mood stable.     Physical Exam:  BP 122/70 (BP Location: Left Arm, Patient Position: Sitting, Cuff Size: Normal)    Pulse 62    Temp 98.6 F (37 C) (Oral)    Ht 5\' 5"  (1.651 m)    Wt 180 lb (81.6 kg)    LMP  (LMP Unknown)    SpO2 92%    BMI 29.95 kg/m   GEN: Pleasant, interactive, well-nourished; in no acute distress. HEENT:  Normocephalic and atraumatic. EACs patent bilaterally. TM pearly gray with present light reflex bilaterally. PERRLA. Sclera white. Nasal turbinates pink, moist and patent bilaterally. No rhinorrhea present. Oropharynx pink and moist, without exudate  or edema. No lesions, ulcerations, or postnasal drip.  NECK:  Supple w/ fair ROM. No JVD present. Normal carotid impulses w/o bruits. Thyroid symmetrical with no goiter or nodules palpated. No lymphadenopathy.   CV: RRR, no m/r/g, no peripheral edema. Pulses intact, +2 bilaterally. No cyanosis, pallor or clubbing. PULMONARY:  Unlabored, regular breathing. Diminished bases bilaterally; minimal scattered rhonchi. No accessory muscle use. No dullness to percussion. GI: BS present and normoactive. Soft, non-tender to palpation. No organomegaly or masses detected. No CVA tenderness. MSK: No erythema, warmth or tenderness. Cap refil <2 sec all extrem. No deformities or joint swelling noted.  Neuro: A/Ox3. No focal deficits noted.   Skin: Warm, no lesions or rashe Psych: Normal affect and behavior. Judgement and thought content appropriate.     Lab Results:  CBC    Component Value Date/Time   WBC 10.3 07/10/2019 0324   RBC 4.50 07/10/2019 0324   HGB 12.8 07/10/2019 0324   HCT 37.8 07/10/2019 0324   PLT 411 (H) 07/10/2019 0324   MCV 84.0 07/10/2019 0324   MCH 28.4 07/10/2019 0324   MCHC 33.9  07/10/2019 0324   RDW 13.7 07/10/2019 0324   LYMPHSABS 1.8 07/10/2019 0324   MONOABS 1.2 (H) 07/10/2019 0324   EOSABS 0.0 07/10/2019 0324   BASOSABS 0.0 07/10/2019 0324    BMET    Component Value Date/Time   NA 139 07/10/2019 0324   NA 141 09/02/2017 1340   K 3.9 07/10/2019 0324   CL 98 07/10/2019 0324   CO2 26 07/10/2019 0324   GLUCOSE 221 (H) 07/10/2019 0324   BUN 23 07/10/2019 0324   BUN 16 09/02/2017 1340   CREATININE 0.88 07/10/2019 0324   CREATININE 0.98 12/08/2015 0955   CALCIUM 9.1 07/10/2019 0324   GFRNONAA >60 07/10/2019 0324   GFRAA >60 07/10/2019 0324    BNP    Component Value Date/Time   BNP 51.6 07/06/2019 1630     Imaging:  No results found.    PFT Results Latest Ref Rng & Units 02/07/2021 12/14/2019 10/19/2014  FVC-Pre L 1.73 1.94 2.22  FVC-Predicted Pre % 66 74 80  FVC-Post L 2.01 - 2.34  FVC-Predicted Post % 77 - 84  Pre FEV1/FVC % % 51 49 51  Post FEV1/FCV % % 52 - 53  FEV1-Pre L 0.88 0.96 1.13  FEV1-Predicted Pre % 43 46 52  FEV1-Post L 1.05 - 1.24  DLCO uncorrected ml/min/mmHg 9.61 8.28 10.30  DLCO UNC% % 46 39 38  DLVA Predicted % 58 53 52  TLC L 5.25 4.87 4.87  TLC % Predicted % 98 90 90  RV % Predicted % 145 121 121    No results found for: NITRICOXIDE      Assessment & Plan:   COPD exacerbation (HCC) Slowly improving AECOPD. Will extend prednisone taper and add doxy course d/t productive cough. CXR today. She has been treated with prednisone 3 times for flares since October; also had a significant BD response on most recent PFTs. Will step up to triple therapy inhaler with trial of Trelegy.   Patient Instructions  -Continue Stiolto 2 puffs daily  -Continue Albuterol inhaler 2 puffs or 3 mL neb every 6 hours as needed for shortness of breath or wheezing. Notify if symptoms persist despite rescue inhaler/neb use. -Continue claritin 10 mg daily for allergies -Continue supplemental oxygen 2 lpm at night and as needed with  activity to maintain oxygen saturation >88-90%  -Start Trelegy 1 puff daily. Brush tongue and rinse  mouth afterwards. -Extend prednisone taper. Two more days of 20 mg then decrease to 10 mg for 3 days then 5 mg for 3 days then stop. Take in AM with food. -Doxycycline 100 mg Twice daily for 7 days.  Finish your antibiotics in their entirety. Do not stop just because symptoms improve.   Chest x ray today. We will notify you of any abnormal results.   Notify if worsening breathlessness, cough, mucus production, fatigue, or wheezing occurs.   Follow up in one month with Dr. Melvyn Novas or Alanson Aly. If symptoms do not improve or worsen, please contact office for sooner follow up or seek emergency care.    Chronic respiratory failure with hypoxia (HCC) Oxygen stable on room air in office. Advised to monitor at home and notify of any increasing requirements or if <88-90%.     Clayton Bibles, NP 08/22/2021  Pt aware and understands NP's role.

## 2021-08-22 NOTE — Assessment & Plan Note (Signed)
Oxygen stable on room air in office. Advised to monitor at home and notify of any increasing requirements or if <88-90%.

## 2021-08-29 ENCOUNTER — Ambulatory Visit (INDEPENDENT_AMBULATORY_CARE_PROVIDER_SITE_OTHER): Payer: Medicare Other | Admitting: Podiatry

## 2021-08-29 ENCOUNTER — Other Ambulatory Visit: Payer: Self-pay

## 2021-08-29 DIAGNOSIS — Z794 Long term (current) use of insulin: Secondary | ICD-10-CM

## 2021-08-29 DIAGNOSIS — E1169 Type 2 diabetes mellitus with other specified complication: Secondary | ICD-10-CM | POA: Diagnosis not present

## 2021-08-29 DIAGNOSIS — M79674 Pain in right toe(s): Secondary | ICD-10-CM | POA: Diagnosis not present

## 2021-08-29 DIAGNOSIS — B351 Tinea unguium: Secondary | ICD-10-CM

## 2021-08-29 DIAGNOSIS — M79675 Pain in left toe(s): Secondary | ICD-10-CM | POA: Diagnosis not present

## 2021-08-31 NOTE — Progress Notes (Signed)
°  Subjective:  Patient ID: Erika Leach, female    DOB: 1950-09-19,  MRN: [id number]  Chief Complaint  Patient presents with   Nail Problem    Nail trim    71 y.o. female returns for the above complaint.  Patient presents with thickened elongated dystrophic toenails x10.  Pain on palpation.  She would like to have the debrided she is not able to do it herself.  She is try medication to help with it which has not helped much.  She denies any other acute complaints.  Objective:  There were no vitals filed for this visit. Podiatric Exam: Vascular: dorsalis pedis and posterior tibial pulses are palpable bilateral. Capillary return is immediate. Temperature gradient is WNL. Skin turgor WNL  Sensorium: Normal Semmes Weinstein monofilament test. Normal tactile sensation bilaterally. Nail Exam: Pt has thick disfigured discolored nails with subungual debris noted bilateral entire nail hallux through fifth toenails.  Pain on palpation to the nails. Ulcer Exam: There is no evidence of ulcer or pre-ulcerative changes or infection. Orthopedic Exam: Muscle tone and strength are WNL. No limitations in general ROM. No crepitus or effusions noted.  Skin: No Porokeratosis. No infection or ulcers    Assessment & Plan:   No diagnosis found.   Patient was evaluated and treated and all questions answered.  Onychomycosis with pain  -Nails palliatively debrided as below. -Educated on self-care  Procedure: Nail Debridement Rationale: pain  Type of Debridement: manual, sharp debridement. Instrumentation: Nail nipper, rotary burr. Number of Nails: 10  Procedures and Treatment: Consent by patient was obtained for treatment procedures. The patient understood the discussion of treatment and procedures well. All questions were answered thoroughly reviewed. Debridement of mycotic and hypertrophic toenails, 1 through 5 bilateral and clearing of subungual debris. No ulceration, no infection noted.  Return  Visit-Office Procedure: Patient instructed to return to the office for a follow up visit 3 months for continued evaluation and treatment.  Boneta Lucks, DPM    No follow-ups on file.

## 2021-09-05 ENCOUNTER — Encounter: Payer: Self-pay | Admitting: Acute Care

## 2021-09-08 ENCOUNTER — Other Ambulatory Visit: Payer: Self-pay | Admitting: Internal Medicine

## 2021-09-11 ENCOUNTER — Ambulatory Visit (INDEPENDENT_AMBULATORY_CARE_PROVIDER_SITE_OTHER): Payer: Medicare Other | Admitting: Internal Medicine

## 2021-09-11 ENCOUNTER — Ambulatory Visit (INDEPENDENT_AMBULATORY_CARE_PROVIDER_SITE_OTHER): Payer: Medicare Other

## 2021-09-11 ENCOUNTER — Other Ambulatory Visit: Payer: Self-pay

## 2021-09-11 ENCOUNTER — Encounter: Payer: Self-pay | Admitting: Internal Medicine

## 2021-09-11 DIAGNOSIS — I442 Atrioventricular block, complete: Secondary | ICD-10-CM

## 2021-09-11 DIAGNOSIS — J449 Chronic obstructive pulmonary disease, unspecified: Secondary | ICD-10-CM

## 2021-09-11 DIAGNOSIS — J9611 Chronic respiratory failure with hypoxia: Secondary | ICD-10-CM | POA: Diagnosis not present

## 2021-09-11 NOTE — Assessment & Plan Note (Addendum)
New start on outpt 02 03/20/2019 p d/c from admit  ?04/01/2019 Patient Saturations on Room Air at Rest = 96% >>> Room Air while Ambulating = 88% and on  2 Liters of pulsed oxygen while Ambulating = 93% ?- pt request change to cyclinders only 07/10/2020   ? ?Advised  ?Make sure you check your oxygen saturation  AT  your highest level of activity (not after you stop)   to be sure it stays over 90% and adjust  02 flow upward to maintain this level if needed but remember to turn it back to previous settings when you stop (to conserve your supply).  ? ?F/u q 12 m, sooner if needed  ? ?    ?  ? ?Each maintenance medication was reviewed in detail including emphasizing most importantly the difference between maintenance and prns and under what circumstances the prns are to be triggered using an action plan format where appropriate. ? ?Total time for H and P, chart review, counseling, reviewing dpi/02 device(s) and generating customized AVS unique to this office visit / same day charting = 25 min  ?     ?

## 2021-09-11 NOTE — Patient Instructions (Signed)
No change in medications ? ?We will be calling you re low dose Screening CT chest scan program  ? ?Please schedule a follow up visit in 12 months but call sooner if needed  ?

## 2021-09-11 NOTE — Progress Notes (Signed)
? ?Subjective:  ? ?Patient ID: Erika Leach, female    DOB: 02-18-1951, 71 y.o.   MRN: [id number] ? ?  ?Brief patient profile:  ?65 yobf  Quit smoking 2014 @ wt 165 with h/o childhood asthma outgrew by JHS with onset breathing problems during pregnancies but no maint rx then worse 2013 /  and on inhalers ever since so referred to pulmonary clinic 11/01/2015 by Dr Sheryle Hail with dx of GOLD III copd 10/2014 . ? ? ? ?History of Present Illness  ?11/01/2015 1st [facility] office visit/ Erika Leach   ?Chief Complaint  ?Patient presents with  ? Pulmonary Consult  ?  Referred by Dr. Antonietta Jewel. Pt c/o SOB x 4 yrs- "all the time" with or without any exertion. She also c/o prod cough with white sputum.    ?usually no cough first thing then as stir around starts cough/  some coughing also  at bedtime but doesn't keep her up, cough drops work the best/ so severe she gags >  proair helps for a few hours then bad again, no better on qvar so dc'd. Can lose breath sitting with / without cough / gag ?On Lisinopril since quit smoking and pattern has persisted daily since then even on pred which "just makes her fat, doesn't really help sob or cough" ?rec ?Stop lisiopril and start valsartan 160/12.5 one daily and your cough should gradually resolve over several weeks  ?Only use your albuterol as a rescue medication  ?GERD    ?Please schedule a follow up office visit in 4 weeks, sooner if needed to address any and all of  the rest of your respiratory symptoms that don't resolve off lisinopril (it's the only way to sort this out)  ? ?  ? ?05/21/2021  f/u ov/Erika Leach re: GOLD 3/ 02 dep at hs and prn maint on stiolto   ?Chief Complaint  ?Patient presents with  ? Follow-up  ?  3 mo f/u. States she has had one flair up since last visit but she is back to feeling better.   ?Dyspnea:  worse than prior but not really walking  ?Cough: none ?Sleeping: flat bed one pillow ?SABA use: not much/last prednisone was one month prior to OV   ?02: 2lpm hs and prn  daytime p ex ?Covid status:  vax  x 3/ infected original wave in spring of 2020  ?Rec ?LDSCT > did not schedule  ? ?09/11/2021  f/u ov/Erika Leach re: GOLD 3 / 02 hs and prn maint on trelegy   ?No chief complaint on file. ?  ?Dyspnea:  just finished trip to [place] and did fine but not checking sats as rec  ?Cough: none  ?Sleeping: flat bed one pillows  ?SABA use: once a week ?02: 2lpm hs and prn daytime  ?Covid status:   3 vax / one infection ? ? ?No obvious day to day or daytime variability or assoc excess/ purulent sputum or mucus plugs or hemoptysis or cp or chest tightness, subjective wheeze or overt sinus or hb symptoms.  ? ?Sleeping  without nocturnal  or early am exacerbation  of respiratory  c/o's or need for noct saba. Also denies any obvious fluctuation of symptoms with weather or environmental changes or other aggravating or alleviating factors except as outlined above  ? ?No unusual exposure hx or h/o childhood pna/ asthma or knowledge of premature birth. ? ?Current Allergies, Complete Past Medical History, Past Surgical History, Family History, and Social History were reviewed in [employer]  record. ? ?ROS  The following are not active complaints unless bolded ?Hoarseness, sore throat, dysphagia, dental problems, itching, sneezing,  nasal congestion or discharge of excess mucus or purulent secretions, ear ache,   fever, chills, sweats, unintended wt loss or wt gain, classically pleuritic or exertional cp,  orthopnea pnd or arm/hand swelling  or leg swelling, presyncope, palpitations, abdominal pain, anorexia, nausea, vomiting, diarrhea  or change in bowel habits or change in bladder habits, change in stools or change in urine, dysuria, hematuria,  rash, arthralgias, visual complaints, headache, numbness, weakness or ataxia or problems with walking or coordination,  change in mood or  memory. ?      ? ?Current Meds  ?Medication Sig  ? albuterol (PROVENTIL) (2.5 MG/3ML) 0.083% nebulizer solution  Take 2.5 mg by nebulization every 6 (six) hours as needed for wheezing or shortness of breath.   ? albuterol (VENTOLIN HFA) 108 (90 Base) MCG/ACT inhaler Inhale 2 puffs into the lungs every 6 (six) hours as needed for wheezing or shortness of breath.   ? amLODipine (NORVASC) 10 MG tablet Take 1 tablet (10 mg total) by mouth daily.  ? ascorbic acid (VITAMIN C) 500 MG tablet Take 1 tablet (500 mg total) by mouth daily.  ? bisoprolol (ZEBETA) 5 MG tablet TAKE 1 TABLET (5 MG TOTAL) BY MOUTH DAILY.  ? cloNIDine (CATAPRES) 0.1 MG tablet Take 1 tablet (0.1 mg total) by mouth 2 (two) times daily.  ? clotrimazole (LOTRIMIN) 1 % cream Apply 1 application topically 2 (two) times daily.  ? clotrimazole (MYCELEX) 10 MG troche Take 1 tablet (10 mg total) by mouth 5 (five) times daily.  ? famotidine (PEPCID) 20 MG tablet TAKE 1 TABLET BY MOUTH AFTER SUPPER  ? Fluticasone-Umeclidin-Vilant (TRELEGY ELLIPTA) 100-62.5-25 MCG/ACT AEPB Inhale 1 puff into the lungs daily.  ? gabapentin (NEURONTIN) 300 MG capsule Take 1 capsule by mouth 3 (three) times daily.  ? glipiZIDE (GLUCOTROL) 5 MG tablet Take 5 mg by mouth daily before breakfast.  ? HYDROcodone-acetaminophen (NORCO/VICODIN) 5-325 MG tablet Take 1 tablet by mouth 3 (three) times daily as needed.  ? loratadine (CLARITIN) 10 MG tablet Take 1 tablet (10 mg total) by mouth daily.  ? lovastatin (MEVACOR) 20 MG tablet Take 20 mg by mouth every morning.   ? metFORMIN (GLUCOPHAGE) 500 MG tablet Take 500 mg by mouth 2 (two) times daily.  ? pantoprazole (PROTONIX) 40 MG tablet Take 1 tablet (40 mg total) by mouth daily. Take 30-60 min before first meal of the day  ? terbinafine (LAMISIL) 250 MG tablet TAKE 1 TABLET BY MOUTH EVERY DAY  ? [DISCONTINUED] doxycycline (VIBRA-TABS) 100 MG tablet Take 1 tablet (100 mg total) by mouth 2 (two) times daily.  ? [DISCONTINUED] Fluticasone-Umeclidin-Vilant (TRELEGY ELLIPTA) 100-62.5-25 MCG/ACT AEPB Inhale 1 puff into the lungs daily.  ? [DISCONTINUED]  predniSONE (DELTASONE) 10 MG tablet 2 tabs for 2 days, then 1 tab for 3 days, half of a tab for 3 days, then stop  ? [DISCONTINUED] STIOLTO RESPIMAT 2.5-2.5 MCG/ACT AERS SMARTSIG:2 Puff(s) Via Inhaler Daily  ?    ? ?  ? ?  ? ?       ?  ?Objective:  ? Physical Exam ?wt ? 09/11/2021         177   ?05/21/2021      187  ?07/10/2020          187  ?03/20/2020       188  ?12/14/2019  197  ?06/15/2019       198  ?05/03/2019     196  ?04/01/2019       196  ? 11/29/2015       200   ?11/01/15 200 lb 12.8 oz (91.082 kg)  ?02/14/15 197 lb (89.359 kg)  ?11/23/14 193 lb 9.6 oz (87.816 kg)  ? ?Vital signs reviewed  09/11/2021  - Note at rest 02 sats  90% on RA  ? ?General appearance:    amb bf nad   ? ? ?HEENT : pt wearing mask not removed for exam due to covid - 19 concerns.  ? ? ?NECK :  without JVD/Nodes/TM/ nl carotid upstrokes bilaterally ? ? ?LUNGS: no acc muscle use,  Mild barrel  contour chest wall with bilateral  Distant bs s audible wheeze and  without cough on insp or exp maneuvers  and mild  Hyperresonant  to  percussion bilaterally   ? ? ?CV:  RRR  no s3 or murmur or increase in P2, and no edema  ? ?ABD:  soft and nontender with pos end  insp Hoover's  in the supine position. No bruits or organomegaly appreciated, bowel sounds nl ? ?MS:   Nl gait/  ext warm without deformities, calf tenderness, cyanosis or clubbing ?No obvious joint restrictions  ? ?SKIN: warm and dry without lesions   ? ?NEURO:  alert, approp, nl sensorium with  no motor or cerebellar deficits apparent.  ?    ? ? ?I personally reviewed images and agree with radiology impression as follows:  ?CXR:   pa and lateral  08/22/21  ?No active cardiopulmonary disease. ?  ? ? ? ?  ?   ?Assessment & Plan:  ? ?  ?

## 2021-09-11 NOTE — Assessment & Plan Note (Addendum)
Quit smoking 2014  PFT's 10/19/14   FEV1 1.24 (57 % ) ratio 53  p 9 % improvement from saba p ?  prior to study with DLCO  38 % corrects to 52 % for alv volume   ?- 11/29/2015    try bevespi  ?- 03/20/2019 admit with aecopd with Eos 1.5  ?- 04/01/2019    changed spiriva to respimat/ continue symb 160 2bid ?- 04/01/2019 pred as plan d x 6 days only   ?- 05/03/19 changed symb to 80 due to thrush  ?- PFT's  12/14/2019  FEV1 0.96 (46 % ) ratio 0.49  p saba w/in one hour prior to study with DLCO  8.28 (39%) corrects to 2.16 (53%)  for alv volume and FV curve classic concave  ?- 03/20/2020  After extensive coaching inhaler device,  effectiveness =    75% smi > continue stiolto but use 1 puff each am instead of 2 (strongly doubt it's causing oral candidiasis)  ?-07/10/2020  refilled prednisone x 6 days cycles only and added gerd rx  ?- 04/18/21  Added flutter valve  ?- 08/22/21 changed to trelegy  ? ?Prefers trelegy over stioloto so ok to continue for now  ? ?Low-dose CT lung cancer screening is recommended for patients who ?are 46-58 years of age with a 20+ pack-year history of smoking and ?who are currently smoking or quit <=15 years ago. ?No coughing up blood  ?No unintentional weight loss of > 15 pounds in the last 6 months  ?>>> still has eligibility until 2029 > rerferred for shared decision making  ?

## 2021-09-12 LAB — CUP PACEART REMOTE DEVICE CHECK
Battery Impedance: 1652 Ohm
Battery Remaining Longevity: 39 mo
Battery Voltage: 2.75 V
Brady Statistic AP VP Percent: 75 %
Brady Statistic AP VS Percent: 1 %
Brady Statistic AS VP Percent: 17 %
Brady Statistic AS VS Percent: 7 %
Date Time Interrogation Session: 20230308103055
Implantable Lead Implant Date: 20131218
Implantable Lead Implant Date: 20131218
Implantable Lead Location: 753859
Implantable Lead Location: 753860
Implantable Lead Model: 5076
Implantable Lead Model: 5076
Implantable Pulse Generator Implant Date: 20131218
Lead Channel Impedance Value: 522 Ohm
Lead Channel Impedance Value: 533 Ohm
Lead Channel Pacing Threshold Amplitude: 0.625 V
Lead Channel Pacing Threshold Amplitude: 0.75 V
Lead Channel Pacing Threshold Pulse Width: 0.4 ms
Lead Channel Pacing Threshold Pulse Width: 0.4 ms
Lead Channel Setting Pacing Amplitude: 2 V
Lead Channel Setting Pacing Amplitude: 2.5 V
Lead Channel Setting Pacing Pulse Width: 0.4 ms
Lead Channel Setting Sensing Sensitivity: 2 mV

## 2021-09-24 NOTE — Progress Notes (Signed)
Remote pacemaker transmission.   

## 2021-10-24 ENCOUNTER — Other Ambulatory Visit: Payer: Self-pay | Admitting: Internal Medicine

## 2021-10-24 DIAGNOSIS — J441 Chronic obstructive pulmonary disease with (acute) exacerbation: Secondary | ICD-10-CM

## 2021-11-28 ENCOUNTER — Ambulatory Visit (INDEPENDENT_AMBULATORY_CARE_PROVIDER_SITE_OTHER): Payer: Medicare Other | Admitting: Podiatry

## 2021-11-28 ENCOUNTER — Encounter: Payer: Self-pay | Admitting: Podiatry

## 2021-11-28 DIAGNOSIS — Z794 Long term (current) use of insulin: Secondary | ICD-10-CM

## 2021-11-28 DIAGNOSIS — M79674 Pain in right toe(s): Secondary | ICD-10-CM

## 2021-11-28 DIAGNOSIS — M79675 Pain in left toe(s): Secondary | ICD-10-CM | POA: Diagnosis not present

## 2021-11-28 DIAGNOSIS — E1169 Type 2 diabetes mellitus with other specified complication: Secondary | ICD-10-CM | POA: Diagnosis not present

## 2021-11-28 DIAGNOSIS — B351 Tinea unguium: Secondary | ICD-10-CM | POA: Diagnosis not present

## 2021-11-28 NOTE — Progress Notes (Signed)
  Subjective:  Patient ID: Erika Leach, female    DOB: 1951/07/03,  MRN: [id number]  Chief Complaint  Patient presents with   Nail Problem   71 y.o. female returns for the above complaint.  Patient presents with thickened elongated dystrophic toenails x10.  Pain on palpation.  She would like to have the debrided she is not able to do it herself.  She is try medication to help with it which has not helped much.  She denies any other acute complaints.  Objective:  There were no vitals filed for this visit. Podiatric Exam: Vascular: dorsalis pedis and posterior tibial pulses are palpable bilateral. Capillary return is immediate. Temperature gradient is WNL. Skin turgor WNL  Sensorium: Normal Semmes Weinstein monofilament test. Normal tactile sensation bilaterally. Nail Exam: Pt has thick disfigured discolored nails with subungual debris noted bilateral entire nail hallux through fifth toenails.  Pain on palpation to the nails. Ulcer Exam: There is no evidence of ulcer or pre-ulcerative changes or infection. Orthopedic Exam: Muscle tone and strength are WNL. No limitations in general ROM. No crepitus or effusions noted.  Skin: No Porokeratosis. No infection or ulcers    Assessment & Plan:   1. Pain due to onychomycosis of toenails of both feet   2. Type 2 diabetes mellitus with other specified complication, with long-term current use of insulin ([place])      Patient was evaluated and treated and all questions answered.  Onychomycosis with pain  -Nails palliatively debrided as below. -Educated on self-care  Procedure: Nail Debridement Rationale: pain  Type of Debridement: manual, sharp debridement. Instrumentation: Nail nipper, rotary burr. Number of Nails: 10  Procedures and Treatment: Consent by patient was obtained for treatment procedures. The patient understood the discussion of treatment and procedures well. All questions were answered thoroughly reviewed. Debridement of  mycotic and hypertrophic toenails, 1 through 5 bilateral and clearing of subungual debris. No ulceration, no infection noted.  Return Visit-Office Procedure: Patient instructed to return to the office for a follow up visit 3 months for continued evaluation and treatment.  Boneta Lucks, DPM    No follow-ups on file.

## 2021-12-11 ENCOUNTER — Ambulatory Visit (INDEPENDENT_AMBULATORY_CARE_PROVIDER_SITE_OTHER): Payer: Medicare Other

## 2021-12-11 DIAGNOSIS — Z950 Presence of cardiac pacemaker: Secondary | ICD-10-CM

## 2021-12-11 DIAGNOSIS — I442 Atrioventricular block, complete: Secondary | ICD-10-CM | POA: Diagnosis not present

## 2021-12-12 LAB — CUP PACEART REMOTE DEVICE CHECK
Battery Impedance: 1740 Ohm
Battery Remaining Longevity: 37 mo
Battery Voltage: 2.75 V
Brady Statistic AP VP Percent: 76 %
Brady Statistic AP VS Percent: 1 %
Brady Statistic AS VP Percent: 16 %
Brady Statistic AS VS Percent: 7 %
Date Time Interrogation Session: 20230607113455
Implantable Lead Implant Date: 20131218
Implantable Lead Implant Date: 20131218
Implantable Lead Location: 753859
Implantable Lead Location: 753860
Implantable Lead Model: 5076
Implantable Lead Model: 5076
Implantable Pulse Generator Implant Date: 20131218
Lead Channel Impedance Value: 485 Ohm
Lead Channel Impedance Value: 518 Ohm
Lead Channel Pacing Threshold Amplitude: 0.625 V
Lead Channel Pacing Threshold Amplitude: 0.75 V
Lead Channel Pacing Threshold Pulse Width: 0.4 ms
Lead Channel Pacing Threshold Pulse Width: 0.4 ms
Lead Channel Setting Pacing Amplitude: 2 V
Lead Channel Setting Pacing Amplitude: 2.5 V
Lead Channel Setting Pacing Pulse Width: 0.4 ms
Lead Channel Setting Sensing Sensitivity: 2 mV

## 2021-12-25 NOTE — Progress Notes (Signed)
Remote pacemaker transmission.   

## 2021-12-28 ENCOUNTER — Other Ambulatory Visit: Payer: Self-pay | Admitting: Family Medicine

## 2021-12-28 DIAGNOSIS — E2839 Other primary ovarian failure: Secondary | ICD-10-CM

## 2022-01-11 ENCOUNTER — Other Ambulatory Visit: Payer: Self-pay | Admitting: Family Medicine

## 2022-01-11 DIAGNOSIS — Z1231 Encounter for screening mammogram for malignant neoplasm of breast: Secondary | ICD-10-CM

## 2022-01-22 ENCOUNTER — Inpatient Hospital Stay: Admission: RE | Admit: 2022-01-22 | Payer: Medicare Other | Source: Ambulatory Visit

## 2022-01-22 ENCOUNTER — Ambulatory Visit: Payer: Medicare Other

## 2022-01-31 ENCOUNTER — Ambulatory Visit
Admission: RE | Admit: 2022-01-31 | Discharge: 2022-01-31 | Disposition: A | Discharge status: Home / Self Care | Payer: Medicare Other | Source: Ambulatory Visit | Attending: Family Medicine | Admitting: Family Medicine

## 2022-01-31 DIAGNOSIS — E2839 Other primary ovarian failure: Secondary | ICD-10-CM

## 2022-01-31 DIAGNOSIS — Z1231 Encounter for screening mammogram for malignant neoplasm of breast: Secondary | ICD-10-CM

## 2022-02-02 ENCOUNTER — Other Ambulatory Visit: Payer: Self-pay | Admitting: Acute Care

## 2022-02-05 ENCOUNTER — Telehealth: Payer: Self-pay | Admitting: Internal Medicine

## 2022-02-05 DIAGNOSIS — J441 Chronic obstructive pulmonary disease with (acute) exacerbation: Secondary | ICD-10-CM

## 2022-02-06 MED ORDER — TRELEGY ELLIPTA 100-62.5-25 MCG/ACT IN AEPB: 1.0000 | INHALATION_SPRAY | Freq: Every day | RESPIRATORY_TRACT | 6 refills | Status: DC

## 2022-02-06 NOTE — Telephone Encounter (Signed)
Called spouse and verified medication that patient needed refilled. Nothing further needed

## 2022-03-01 ENCOUNTER — Ambulatory Visit: Payer: Medicaid Other | Admitting: Podiatry

## 2022-03-03 ENCOUNTER — Other Ambulatory Visit: Payer: Self-pay | Admitting: Acute Care

## 2022-03-08 ENCOUNTER — Other Ambulatory Visit: Payer: Self-pay | Admitting: Family Medicine

## 2022-03-08 ENCOUNTER — Ambulatory Visit
Admission: RE | Admit: 2022-03-08 | Discharge: 2022-03-08 | Disposition: A | Discharge status: Home / Self Care | Payer: Medicare Other | Source: Ambulatory Visit | Attending: Family Medicine | Admitting: Family Medicine

## 2022-03-08 DIAGNOSIS — M25511 Pain in right shoulder: Secondary | ICD-10-CM

## 2022-03-12 ENCOUNTER — Ambulatory Visit (INDEPENDENT_AMBULATORY_CARE_PROVIDER_SITE_OTHER): Payer: Medicare Other

## 2022-03-12 DIAGNOSIS — I442 Atrioventricular block, complete: Secondary | ICD-10-CM | POA: Diagnosis not present

## 2022-03-15 LAB — CUP PACEART REMOTE DEVICE CHECK
Battery Impedance: 1833 Ohm
Battery Remaining Longevity: 35 mo
Battery Voltage: 2.75 V
Brady Statistic AP VP Percent: 77 %
Brady Statistic AP VS Percent: 1 %
Brady Statistic AS VP Percent: 16 %
Brady Statistic AS VS Percent: 6 %
Date Time Interrogation Session: 20230905120847
Implantable Lead Implant Date: 20131218
Implantable Lead Implant Date: 20131218
Implantable Lead Location: 753859
Implantable Lead Location: 753860
Implantable Lead Model: 5076
Implantable Lead Model: 5076
Implantable Pulse Generator Implant Date: 20131218
Lead Channel Impedance Value: 480 Ohm
Lead Channel Impedance Value: 508 Ohm
Lead Channel Pacing Threshold Amplitude: 0.625 V
Lead Channel Pacing Threshold Amplitude: 0.75 V
Lead Channel Pacing Threshold Pulse Width: 0.4 ms
Lead Channel Pacing Threshold Pulse Width: 0.4 ms
Lead Channel Setting Pacing Amplitude: 2 V
Lead Channel Setting Pacing Amplitude: 2.5 V
Lead Channel Setting Pacing Pulse Width: 0.4 ms
Lead Channel Setting Sensing Sensitivity: 2 mV

## 2022-04-03 NOTE — Progress Notes (Signed)
Remote pacemaker transmission.   

## 2022-05-29 ENCOUNTER — Other Ambulatory Visit: Payer: Self-pay | Admitting: Internal Medicine

## 2022-06-11 ENCOUNTER — Ambulatory Visit (INDEPENDENT_AMBULATORY_CARE_PROVIDER_SITE_OTHER): Payer: Medicare Other

## 2022-06-11 DIAGNOSIS — I442 Atrioventricular block, complete: Secondary | ICD-10-CM

## 2022-06-11 DIAGNOSIS — Z950 Presence of cardiac pacemaker: Secondary | ICD-10-CM

## 2022-06-13 LAB — CUP PACEART REMOTE DEVICE CHECK
Battery Impedance: 1895 Ohm
Battery Remaining Longevity: 34 mo
Battery Voltage: 2.74 V
Brady Statistic AP VP Percent: 78 %
Brady Statistic AP VS Percent: 1 %
Brady Statistic AS VP Percent: 15 %
Brady Statistic AS VS Percent: 6 %
Date Time Interrogation Session: 20231207103007
Implantable Lead Connection Status: 753985
Implantable Lead Connection Status: 753985
Implantable Lead Implant Date: 20131218
Implantable Lead Implant Date: 20131218
Implantable Lead Location: 753859
Implantable Lead Location: 753860
Implantable Lead Model: 5076
Implantable Lead Model: 5076
Implantable Pulse Generator Implant Date: 20131218
Lead Channel Impedance Value: 511 Ohm
Lead Channel Impedance Value: 519 Ohm
Lead Channel Pacing Threshold Amplitude: 0.625 V
Lead Channel Pacing Threshold Amplitude: 0.75 V
Lead Channel Pacing Threshold Pulse Width: 0.4 ms
Lead Channel Pacing Threshold Pulse Width: 0.4 ms
Lead Channel Setting Pacing Amplitude: 2 V
Lead Channel Setting Pacing Amplitude: 2.5 V
Lead Channel Setting Pacing Pulse Width: 0.4 ms
Lead Channel Setting Sensing Sensitivity: 2 mV
Zone Setting Status: 755011
Zone Setting Status: 755011

## 2022-07-10 NOTE — Progress Notes (Signed)
Remote pacemaker transmission.   

## 2022-07-30 ENCOUNTER — Other Ambulatory Visit: Payer: Self-pay | Admitting: Family Medicine

## 2022-07-30 ENCOUNTER — Ambulatory Visit
Admission: RE | Admit: 2022-07-30 | Discharge: 2022-07-30 | Disposition: A | Discharge status: Home / Self Care | Payer: 59 | Source: Ambulatory Visit | Attending: Family Medicine | Admitting: Family Medicine

## 2022-07-30 DIAGNOSIS — G8929 Other chronic pain: Secondary | ICD-10-CM

## 2022-07-30 DIAGNOSIS — M25562 Pain in left knee: Secondary | ICD-10-CM

## 2022-08-07 ENCOUNTER — Other Ambulatory Visit: Payer: Self-pay | Admitting: Internal Medicine

## 2022-08-07 DIAGNOSIS — J441 Chronic obstructive pulmonary disease with (acute) exacerbation: Secondary | ICD-10-CM

## 2022-08-09 ENCOUNTER — Telehealth: Payer: Self-pay | Admitting: Internal Medicine

## 2022-08-10 ENCOUNTER — Other Ambulatory Visit: Payer: Self-pay | Admitting: Internal Medicine

## 2022-08-12 MED ORDER — ALBUTEROL SULFATE HFA 108 (90 BASE) MCG/ACT IN AERS: 2.0000 | INHALATION_SPRAY | Freq: Four times a day (QID) | RESPIRATORY_TRACT | 11 refills | Status: AC | PRN

## 2022-08-12 MED ORDER — ALBUTEROL SULFATE (2.5 MG/3ML) 0.083% IN NEBU: 2.5000 mg | INHALATION_SOLUTION | Freq: Four times a day (QID) | RESPIRATORY_TRACT | 11 refills | Status: AC | PRN

## 2022-08-12 MED ORDER — STIOLTO RESPIMAT 2.5-2.5 MCG/ACT IN AERS: 2.0000 | INHALATION_SPRAY | Freq: Every day | RESPIRATORY_TRACT | 3 refills | Status: DC

## 2022-08-12 NOTE — Telephone Encounter (Signed)
Any future refills will require a appointment please advise

## 2022-08-12 NOTE — Telephone Encounter (Signed)
Called [facility] pharmacy and was told that pt was wanting to switch all meds to them for mail order pharmacy. Meds have been sent to [facility] for pt. Nothing further needed.

## 2022-08-29 ENCOUNTER — Telehealth: Payer: Self-pay | Admitting: Internal Medicine

## 2022-08-30 NOTE — Telephone Encounter (Signed)
[place] [place] and spoke with [place] as well as pharmacist and got meds taken care of for pt. Nothing further needed.

## 2022-09-04 ENCOUNTER — Encounter: Payer: Self-pay | Admitting: *Deleted

## 2022-09-10 ENCOUNTER — Ambulatory Visit (INDEPENDENT_AMBULATORY_CARE_PROVIDER_SITE_OTHER): Payer: 59

## 2022-09-10 DIAGNOSIS — I442 Atrioventricular block, complete: Secondary | ICD-10-CM

## 2022-09-11 LAB — CUP PACEART REMOTE DEVICE CHECK
Battery Impedance: 2018 Ohm
Battery Remaining Longevity: 32 mo
Battery Voltage: 2.74 V
Brady Statistic AP VP Percent: 79 %
Brady Statistic AP VS Percent: 1 %
Brady Statistic AS VP Percent: 14 %
Brady Statistic AS VS Percent: 6 %
Date Time Interrogation Session: 20240306154616
Implantable Lead Connection Status: 753985
Implantable Lead Connection Status: 753985
Implantable Lead Implant Date: 20131218
Implantable Lead Implant Date: 20131218
Implantable Lead Location: 753859
Implantable Lead Location: 753860
Implantable Lead Model: 5076
Implantable Lead Model: 5076
Implantable Pulse Generator Implant Date: 20131218
Lead Channel Impedance Value: 518 Ohm
Lead Channel Impedance Value: 525 Ohm
Lead Channel Pacing Threshold Amplitude: 0.625 V
Lead Channel Pacing Threshold Amplitude: 0.75 V
Lead Channel Pacing Threshold Pulse Width: 0.4 ms
Lead Channel Pacing Threshold Pulse Width: 0.4 ms
Lead Channel Setting Pacing Amplitude: 2 V
Lead Channel Setting Pacing Amplitude: 2.5 V
Lead Channel Setting Pacing Pulse Width: 0.4 ms
Lead Channel Setting Sensing Sensitivity: 2 mV
Zone Setting Status: 755011
Zone Setting Status: 755011

## 2022-09-13 ENCOUNTER — Telehealth: Payer: Self-pay | Admitting: Internal Medicine

## 2022-09-13 NOTE — Telephone Encounter (Signed)
Pharmacy Calling in to get a new script sent for meloxicam and famotidine

## 2022-09-13 NOTE — Telephone Encounter (Signed)
Attempted to call [facility] but unable to reach. Left a message for them to return call.

## 2022-10-16 NOTE — Progress Notes (Signed)
Remote pacemaker transmission.   

## 2022-10-28 ENCOUNTER — Other Ambulatory Visit: Payer: Self-pay | Admitting: Internal Medicine

## 2022-10-28 DIAGNOSIS — J441 Chronic obstructive pulmonary disease with (acute) exacerbation: Secondary | ICD-10-CM

## 2022-11-27 ENCOUNTER — Other Ambulatory Visit: Payer: Self-pay | Admitting: Internal Medicine

## 2022-11-27 DIAGNOSIS — J441 Chronic obstructive pulmonary disease with (acute) exacerbation: Secondary | ICD-10-CM

## 2022-12-10 ENCOUNTER — Ambulatory Visit (INDEPENDENT_AMBULATORY_CARE_PROVIDER_SITE_OTHER): Payer: 59

## 2022-12-10 DIAGNOSIS — I442 Atrioventricular block, complete: Secondary | ICD-10-CM | POA: Diagnosis not present

## 2022-12-13 LAB — CUP PACEART REMOTE DEVICE CHECK
Battery Impedance: 2056 Ohm
Battery Remaining Longevity: 32 mo
Battery Voltage: 2.74 V
Brady Statistic AP VP Percent: 80 %
Brady Statistic AP VS Percent: 1 %
Brady Statistic AS VP Percent: 14 %
Brady Statistic AS VS Percent: 5 %
Date Time Interrogation Session: 20240607115330
Implantable Lead Connection Status: 753985
Implantable Lead Connection Status: 753985
Implantable Lead Implant Date: 20131218
Implantable Lead Implant Date: 20131218
Implantable Lead Location: 753859
Implantable Lead Location: 753860
Implantable Lead Model: 5076
Implantable Lead Model: 5076
Implantable Pulse Generator Implant Date: 20131218
Lead Channel Impedance Value: 521 Ohm
Lead Channel Impedance Value: 540 Ohm
Lead Channel Pacing Threshold Amplitude: 0.625 V
Lead Channel Pacing Threshold Amplitude: 0.75 V
Lead Channel Pacing Threshold Pulse Width: 0.4 ms
Lead Channel Pacing Threshold Pulse Width: 0.4 ms
Lead Channel Setting Pacing Amplitude: 2 V
Lead Channel Setting Pacing Amplitude: 2.5 V
Lead Channel Setting Pacing Pulse Width: 0.4 ms
Lead Channel Setting Sensing Sensitivity: 2 mV
Zone Setting Status: 755011
Zone Setting Status: 755011

## 2022-12-14 ENCOUNTER — Other Ambulatory Visit: Payer: Self-pay | Admitting: Internal Medicine

## 2022-12-14 DIAGNOSIS — J441 Chronic obstructive pulmonary disease with (acute) exacerbation: Secondary | ICD-10-CM

## 2022-12-31 ENCOUNTER — Other Ambulatory Visit: Payer: Self-pay | Admitting: Family Medicine

## 2022-12-31 DIAGNOSIS — Z1231 Encounter for screening mammogram for malignant neoplasm of breast: Secondary | ICD-10-CM

## 2023-01-02 NOTE — Progress Notes (Signed)
Remote pacemaker transmission.   

## 2023-01-22 NOTE — Progress Notes (Addendum)
Subjective:   Patient ID: Erika Leach, female    DOB: [birth year], 72 y.o.   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    History of Present Illness  11/01/2015 1st [facility] office visit/ Future [place]   Chief Complaint  Patient presents with   Pulmonary Consult    Referred by Dr. Quitman Livings. Pt c/o SOB x 4 yrs- "all the time" with or without any exertion. She also c/o prod cough with white sputum.    usually no cough first thing then as stir around starts cough/  some coughing also  at bedtime but doesn't keep her up, cough drops work the best/ so severe she gags >  proair helps for a few hours then bad again, no better on qvar so dc'd. Can lose breath sitting with / without cough / gag On Lisinopril since quit smoking and pattern has persisted daily since then even on pred which "just makes her fat, doesn't really help sob or cough" rec Stop lisiopril and start valsartan 160/12.5 one daily and your cough should gradually resolve over several weeks  Only use your albuterol as a rescue medication  GERD    Please schedule a follow up office visit in 4 weeks, sooner if needed to address any and all of  the rest of your respiratory symptoms that don't resolve off lisinopril (it's the only way to sort this out)      05/21/2021  f/u ov/Myeshia Fojtik re: GOLD 3/ 02 dep at hs and prn maint on stiolto   Chief Complaint  Patient presents with   Follow-up    3 mo f/u. States she has had one flair up since last visit but she is back to feeling better.   Dyspnea:  worse than prior but not really walking  Cough: none Sleeping: flat bed one pillow SABA use: not much/last prednisone was one month prior to OV   02: 2lpm hs and prn  daytime p ex Covid status:  vax  x 3/ infected original wave in spring of 2020  Rec LDSCT > did not schedule    09/11/2021  f/u ov/Amzie Sillas re: GOLD 3 / 02 hs and prn maint on trelegy   Dyspnea:  just finished trip to [place] and did fine but not checking sats as rec  Cough: none  Sleeping: flat bed one pillows  SABA use: once a week 02: 2lpm hs and prn daytime  Covid status:   3 vax / one infection Rec No change in medications  Referred for LCS   01/23/2023  f/u ov/Deondrick Searls re: GOLD 3 / 02 prn    maint on telegy / did not do LDSCT yet   Chief Complaint  Patient presents with   Follow-up    Doing well.  Some SOB with hot weather.   Dyspnea:  Not limited by breathing from desired activities   Cough: none  Sleeping: flat bed one pillow fine  SABA use: sometimes needs " with humidity " but fine in A/c apparently 02:prn      No obvious day to day or daytime variability or assoc excess/ purulent sputum or mucus plugs or hemoptysis or cp or chest  tightness, subjective wheeze or overt sinus or hb symptoms.    Also denies any obvious fluctuation of symptoms with weather or environmental changes or other aggravating or alleviating factors except as outlined above   No unusual exposure hx or h/o childhood pna/ asthma or knowledge of premature birth.  Current Allergies, Complete Past Medical History, Past Surgical History, Family History, and Social History were reviewed in [employer] record.  ROS  The following are not active complaints unless bolded Hoarseness, sore throat, dysphagia, dental problems, itching, sneezing,  nasal congestion or discharge of excess mucus or purulent secretions, ear ache,   fever, chills, sweats, unintended wt loss or wt gain, classically pleuritic or exertional cp,  orthopnea pnd or arm/hand swelling  or leg swelling, presyncope, palpitations, abdominal pain, anorexia, nausea, vomiting, diarrhea  or change in bowel habits or change in bladder habits,  change in stools or change in urine, dysuria, hematuria,  rash, arthralgias, visual complaints, headache, numbness, weakness or ataxia or problems with walking  or coordination,  change in mood or  memory.        Current Meds  Medication Sig   albuterol (PROVENTIL) (2.5 MG/3ML) 0.083% nebulizer solution Take 3 mLs (2.5 mg total) by nebulization every 6 (six) hours as needed for wheezing or shortness of breath.   albuterol (VENTOLIN HFA) 108 (90 Base) MCG/ACT inhaler Inhale 2 puffs into the lungs every 6 (six) hours as needed for wheezing or shortness of breath.   amLODipine (NORVASC) 10 MG tablet Take 1 tablet (10 mg total) by mouth daily.   ascorbic acid (VITAMIN C) 500 MG tablet Take 1 tablet (500 mg total) by mouth daily.   bisoprolol (ZEBETA) 5 MG tablet TAKE 1 TABLET (5 MG TOTAL) BY MOUTH DAILY.   cloNIDine (CATAPRES) 0.1 MG tablet Take 1 tablet (0.1 mg total) by mouth 2 (two) times daily.   clotrimazole (LOTRIMIN) 1 % cream Apply 1 application topically 2 (two) times daily.   famotidine (PEPCID) 20 MG tablet TAKE 1 TABLET BY MOUTH AFTER SUPPER   Fluticasone-Umeclidin-Vilant (TRELEGY ELLIPTA) 100-62.5-25 MCG/ACT AEPB INHALE 1 PUFF BY MOUTH EVERY DAY   gabapentin (NEURONTIN) 300 MG capsule Take 1 capsule by mouth 3 (three) times daily.   glipiZIDE (GLUCOTROL) 10 MG tablet Take 10 mg by mouth 2 (two) times daily.   hydrochlorothiazide (MICROZIDE) 12.5 MG capsule Take 12.5 mg by mouth daily.   HYDROcodone-acetaminophen (NORCO/VICODIN) 5-325 MG tablet Take 1 tablet by mouth 3 (three) times daily as needed.   loratadine (CLARITIN) 10 MG tablet Take 1 tablet (10 mg total) by mouth daily.   meloxicam (MOBIC) 15 MG tablet Take 15 mg by mouth daily.   metFORMIN (GLUCOPHAGE) 500 MG tablet Take 500 mg by mouth 2 (two) times daily.   nystatin (MYCOSTATIN) 100000 UNIT/ML suspension Use as directed 5 mLs in the mouth or throat.   pantoprazole (PROTONIX) 40 MG tablet Take 1 tablet (40 mg total) by mouth  daily. Take 30-60 min before first meal of the day   rosuvastatin (CRESTOR) 20 MG tablet Take 20 mg by mouth every morning.   RYBELSUS 3 MG TABS Take 1 tablet by mouth every morning.   Tiotropium Bromide-Olodaterol (STIOLTO RESPIMAT) 2.5-2.5 MCG/ACT AERS Inhale 2 puffs into the lungs daily.                         Objective:   Physical Exam  Wts  01/23/2023  188   09/11/2021         177   05/21/2021      187  07/10/2020          187  03/20/2020       188  12/14/2019         197  06/15/2019       198  05/03/2019     196  04/01/2019       196   11/29/2015       200   11/01/15 200 lb 12.8 oz (91.082 kg)  02/14/15 197 lb (89.359 kg)  11/23/14 193 lb 9.6 oz (87.816 kg)   Vital signs reviewed  01/23/2023  - Note at rest 02 sats  96% on RA   General appearance:    amb   bf nad    HEENT : Oropharynx  clear      NECK :  without  apparent JVD/ palpable Nodes/TM    LUNGS: no acc muscle use,  Mild barrel  contour chest wall with bilateral minimal insp/exp rhonchi  and  without cough on insp or exp maneuvers  and mild  Hyperresonant  to  percussion bilaterally     CV:  RRR  no s3 or murmur or increase in P2, and no edema   ABD:  soft and nontender    MS:   ext warm without deformities Or obvious joint restrictions  calf tenderness, cyanosis or clubbing     SKIN: warm and dry without lesions    NEURO:  alert, approp, nl sensorium with  no motor or cerebellar deficits apparent.           Assessment & Plan:

## 2023-01-23 ENCOUNTER — Ambulatory Visit (INDEPENDENT_AMBULATORY_CARE_PROVIDER_SITE_OTHER): Payer: 59 | Admitting: Internal Medicine

## 2023-01-23 ENCOUNTER — Encounter: Payer: Self-pay | Admitting: Internal Medicine

## 2023-01-23 VITALS — BP 112/58 | HR 60 | Temp 98.5°F | Ht 65.0 in | Wt 188.8 lb

## 2023-01-23 DIAGNOSIS — J9611 Chronic respiratory failure with hypoxia: Secondary | ICD-10-CM

## 2023-01-23 DIAGNOSIS — Z87891 Personal history of nicotine dependence: Secondary | ICD-10-CM | POA: Insufficient documentation

## 2023-01-23 DIAGNOSIS — J449 Chronic obstructive pulmonary disease, unspecified: Secondary | ICD-10-CM | POA: Diagnosis not present

## 2023-01-23 NOTE — Patient Instructions (Addendum)
My office will be contacting you by phone for referral to lung cancer screening program  - if you don't hear back from my office within one week please call us back or notify us thru MyChart and we'll address it right away.    .Please schedule a follow up visit in 12 months but call sooner if needed

## 2023-01-24 NOTE — Assessment & Plan Note (Signed)
New start on outpt 02 03/20/2019 p d/c from admit  04/01/2019 Patient Saturations on Room Air at Rest = 96% >>> Room Air while Ambulating = 88% and on  2 Liters of pulsed oxygen while Ambulating = 93% - pt request change to cyclinders only 07/10/2020    Advised: Make sure you check your oxygen saturation  AT  your highest level of activity (not after you stop)   to be sure it stays over 90% and adjust  02 flow upward to maintain this level if needed but remember to turn it back to previous settings when you stop (to conserve your supply).

## 2023-01-24 NOTE — Assessment & Plan Note (Addendum)
Quit smoking 2014  PFT's 10/19/14   FEV1 1.24 (57 % ) ratio 53  p 9 % improvement from saba p ?  prior to study with DLCO  38 % corrects to 52 % for alv volume   - 11/29/2015    try bevespi  - 03/20/2019 admit with aecopd with Eos 1.5  - 04/01/2019    changed spiriva to respimat/ continue symb 160 2bid - 04/01/2019 pred as plan d x 6 days only   - 05/03/19 changed symb to 80 due to thrush  - PFT's  12/14/2019  FEV1 0.96 (46 % ) ratio 0.49  p saba w/in one hour prior to study with DLCO  8.28 (39%) corrects to 2.16 (53%)  for alv volume and FV curve classic concave  - 03/20/2020  After extensive coaching inhaler device,  effectiveness =    75% smi > continue stiolto but use 1 puff each am instead of 2 (strongly doubt it's causing oral candidiasis)  -07/10/2020  refilled prednisone x 6 days cycles only and added gerd rx  - 04/18/21  Added flutter valve  - 08/22/21 changed to trelegy   Pt is Group B in terms of symptom/risk and laba/lama therefore appropriate rx at this point >>>  continue trelegy 100 and approp saba:  Re SABA :  I spent extra time with pt today reviewing appropriate use of albuterol for prn use on exertion with the following points: 1) saba is for relief of sob that does not improve by walking a slower pace or resting but rather if the pt does not improve after trying this first. 2) If the pt is convinced, as many are, that saba helps recover from activity faster then it's easy to tell if this is the case by re-challenging : ie stop, take the inhaler, then p 5 minutes try the exact same activity (intensity of workload) that just caused the symptoms and see if they are substantially diminished or not after saba 3) if there is an activity that reproducibly causes the symptoms, try the saba 15 min before the activity on alternate days   If in fact the saba really does help, then fine to continue to use it prn but advised may need to look closer at the maintenance regimen being used to achieve  better control of airways disease with exertion.           Each maintenance medication was reviewed in detail including emphasizing most importantly the difference between maintenance and prns and under what circumstances the prns are to be triggered using an action plan format where appropriate.  Total time for H and P, chart review, counseling, reviewing dpi/02 device(s) and generating customized AVS unique to this office visit / same day charting = 22 min

## 2023-01-24 NOTE — Assessment & Plan Note (Signed)
Referred to LCS program   Low-dose CT lung cancer screening is recommended for patients who are 86-72 years of age with a 20+ pack-year history of smoking and who are currently smoking or quit <=15 years ago. No coughing up blood  No unintentional weight loss of > 15 pounds in the last 6 months - pt is eligible for scanning yearly until 2029 by present guidelines > referred

## 2023-02-03 ENCOUNTER — Ambulatory Visit: Payer: 59

## 2023-02-07 ENCOUNTER — Ambulatory Visit
Admission: RE | Admit: 2023-02-07 | Discharge: 2023-02-07 | Disposition: A | Discharge status: Home / Self Care | Payer: 59 | Source: Ambulatory Visit | Attending: Family Medicine | Admitting: Family Medicine

## 2023-02-07 DIAGNOSIS — Z1231 Encounter for screening mammogram for malignant neoplasm of breast: Secondary | ICD-10-CM

## 2023-03-11 ENCOUNTER — Ambulatory Visit (INDEPENDENT_AMBULATORY_CARE_PROVIDER_SITE_OTHER): Payer: 59

## 2023-03-11 DIAGNOSIS — I442 Atrioventricular block, complete: Secondary | ICD-10-CM | POA: Diagnosis not present

## 2023-03-11 LAB — CUP PACEART REMOTE DEVICE CHECK
Battery Impedance: 1973 Ohm
Battery Remaining Longevity: 32 mo
Battery Voltage: 2.73 V
Brady Statistic AP VP Percent: 81 %
Brady Statistic AP VS Percent: 1 %
Brady Statistic AS VP Percent: 13 %
Brady Statistic AS VS Percent: 5 %
Date Time Interrogation Session: 20240903113232
Implantable Lead Connection Status: 753985
Implantable Lead Connection Status: 753985
Implantable Lead Implant Date: 20131218
Implantable Lead Implant Date: 20131218
Implantable Lead Location: 753859
Implantable Lead Location: 753860
Implantable Lead Model: 5076
Implantable Lead Model: 5076
Implantable Pulse Generator Implant Date: 20131218
Lead Channel Impedance Value: 510 Ohm
Lead Channel Impedance Value: 542 Ohm
Lead Channel Pacing Threshold Amplitude: 0.5 V
Lead Channel Pacing Threshold Amplitude: 0.625 V
Lead Channel Pacing Threshold Pulse Width: 0.4 ms
Lead Channel Pacing Threshold Pulse Width: 0.4 ms
Lead Channel Setting Pacing Amplitude: 2 V
Lead Channel Setting Pacing Amplitude: 2.5 V
Lead Channel Setting Pacing Pulse Width: 0.46 ms
Lead Channel Setting Sensing Sensitivity: 2 mV
Zone Setting Status: 755011
Zone Setting Status: 755011

## 2023-03-19 NOTE — Progress Notes (Signed)
Remote pacemaker transmission.   

## 2023-05-16 ENCOUNTER — Encounter: Payer: Self-pay | Admitting: Gastroenterology

## 2023-05-23 ENCOUNTER — Telehealth: Payer: Self-pay | Admitting: Gastroenterology

## 2023-05-23 NOTE — Telephone Encounter (Signed)
Patient called and stated that she received a  letter from our office to schedule an office visit to discuss her next colonoscopy procedure. Patient informed me that she is seeing [facility], and that she will have a colonoscopy done with them in December 5th. I told her I would make a note of it in her chart sense I did not see one already done.

## 2023-05-28 ENCOUNTER — Other Ambulatory Visit: Payer: Self-pay | Admitting: Internal Medicine

## 2023-05-28 DIAGNOSIS — J441 Chronic obstructive pulmonary disease with (acute) exacerbation: Secondary | ICD-10-CM

## 2023-06-08 IMAGING — MG MM DIGITAL SCREENING BILAT W/ TOMO AND CAD
6 of 10 series · 6 of 30 positions shown · non-contrast
Comparison: Previous exam(s).

ACR Breast Density Category a: The breast tissue is almost entirely
fatty.

CLINICAL DATA: Screening.

EXAM:
DIGITAL SCREENING BILATERAL MAMMOGRAM WITH TOMOSYNTHESIS AND CAD
TECHNIQUE: Bilateral screening digital craniocaudal and mediolateral oblique
mammograms were obtained. Bilateral screening digital breast
tomosynthesis was performed. The images were evaluated with
computer-aided detection.

[L MLO synth-2D (1 of 2)]
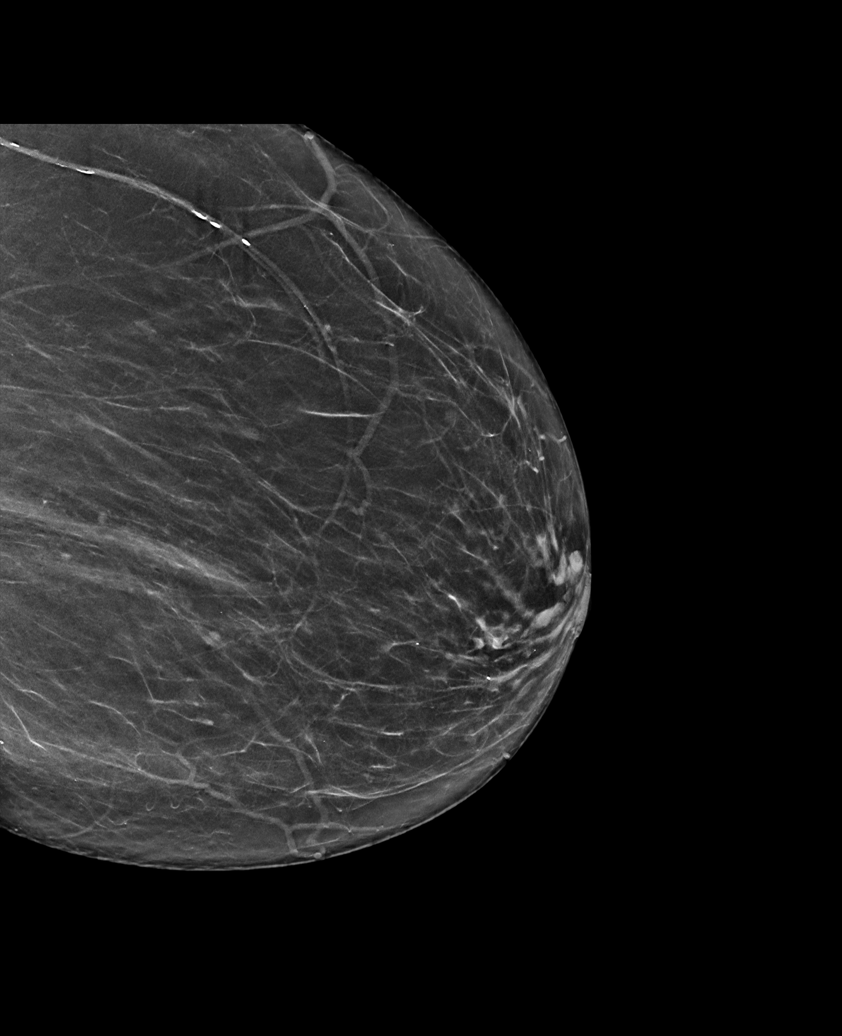

[R CC synth-2D]
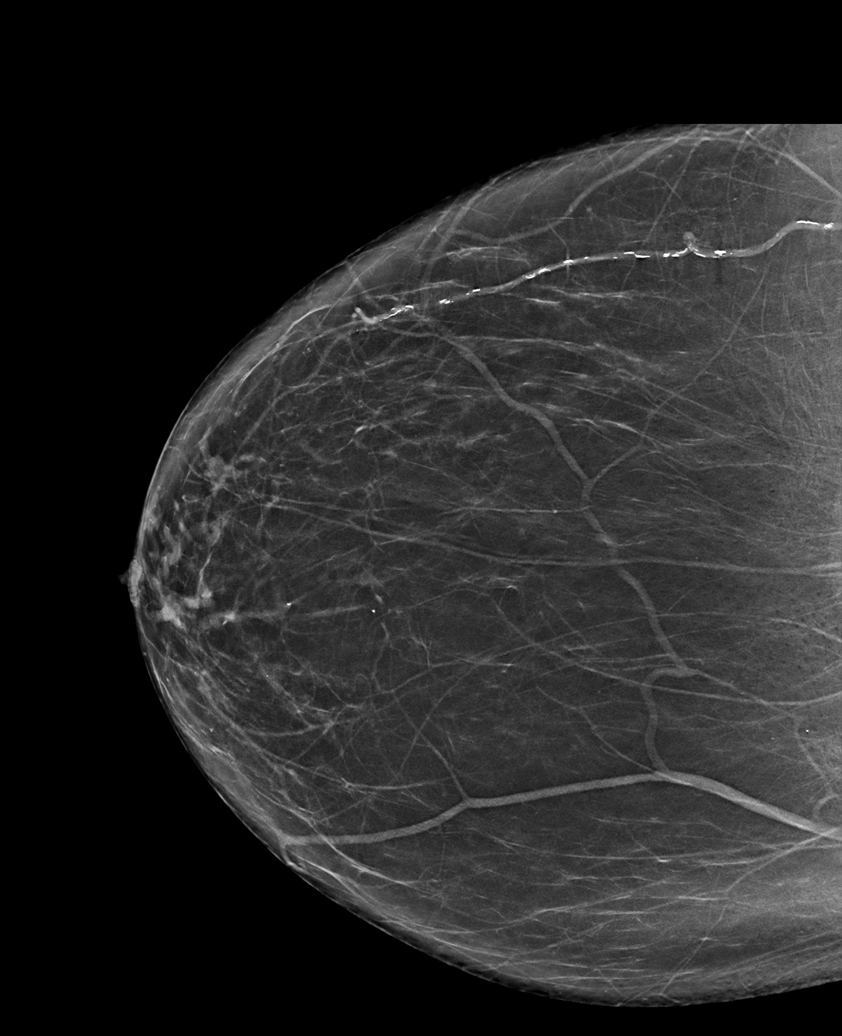

[L MLO synth-2D (2 of 2)]
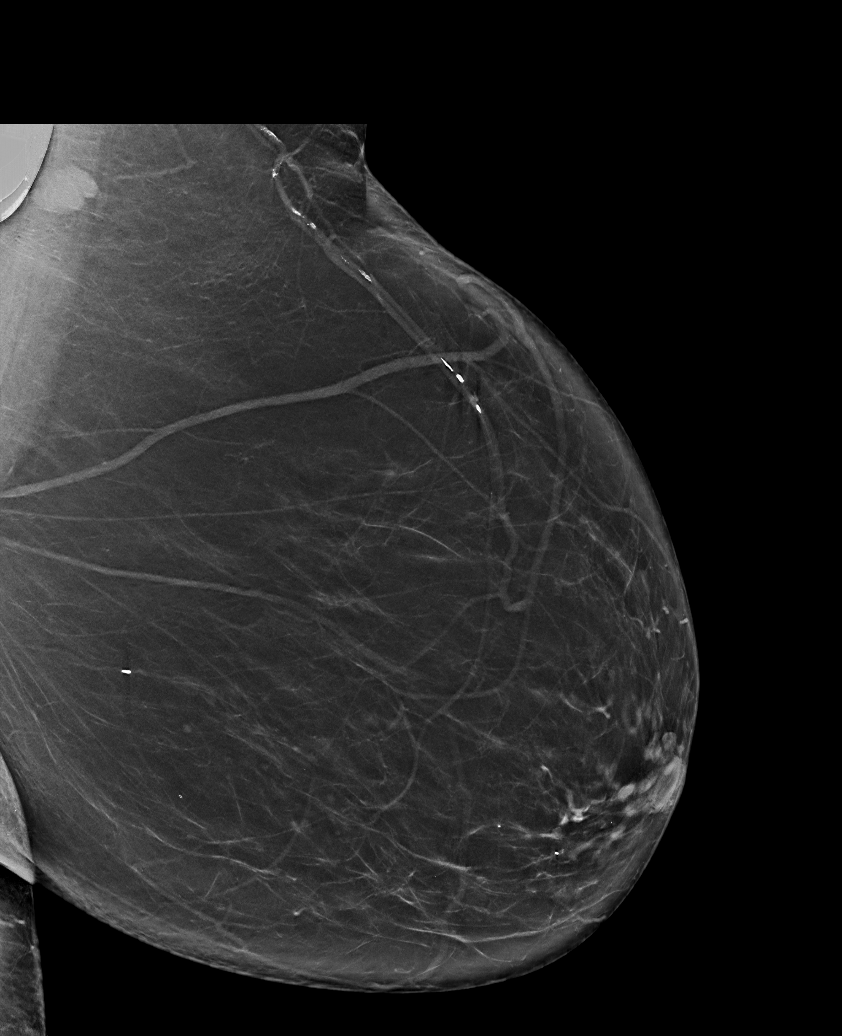

[R MLO synth-2D]
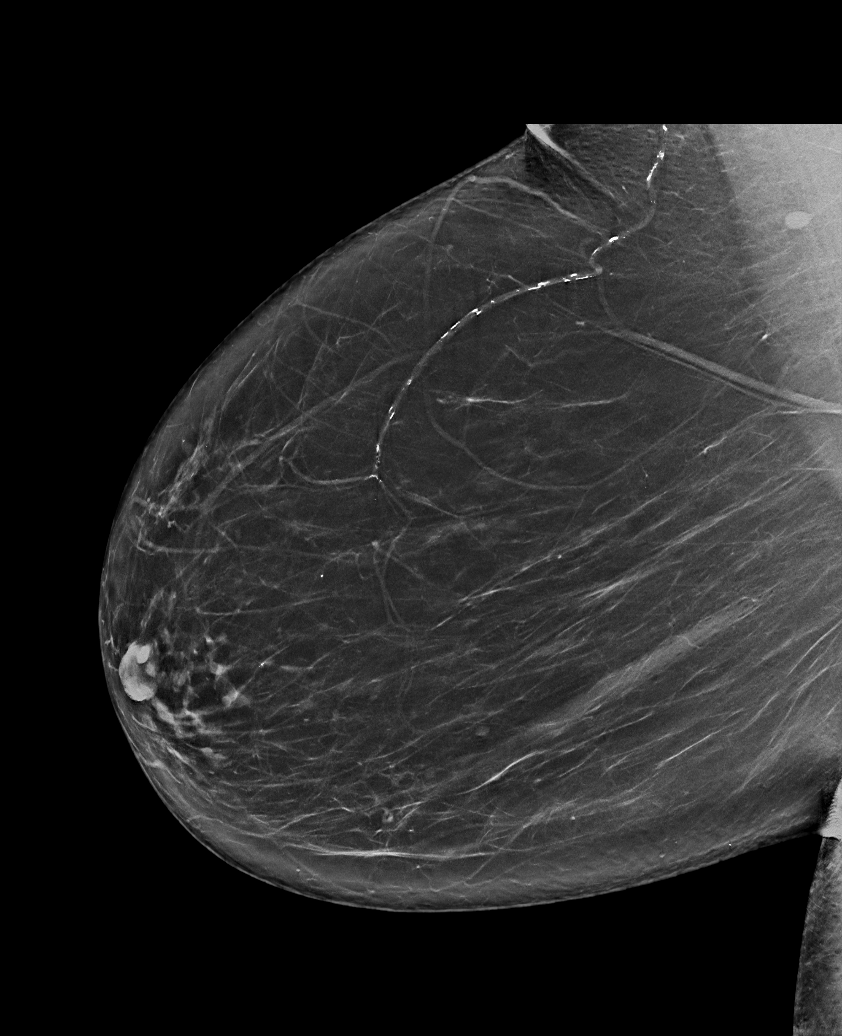

[L CC synth-2D]
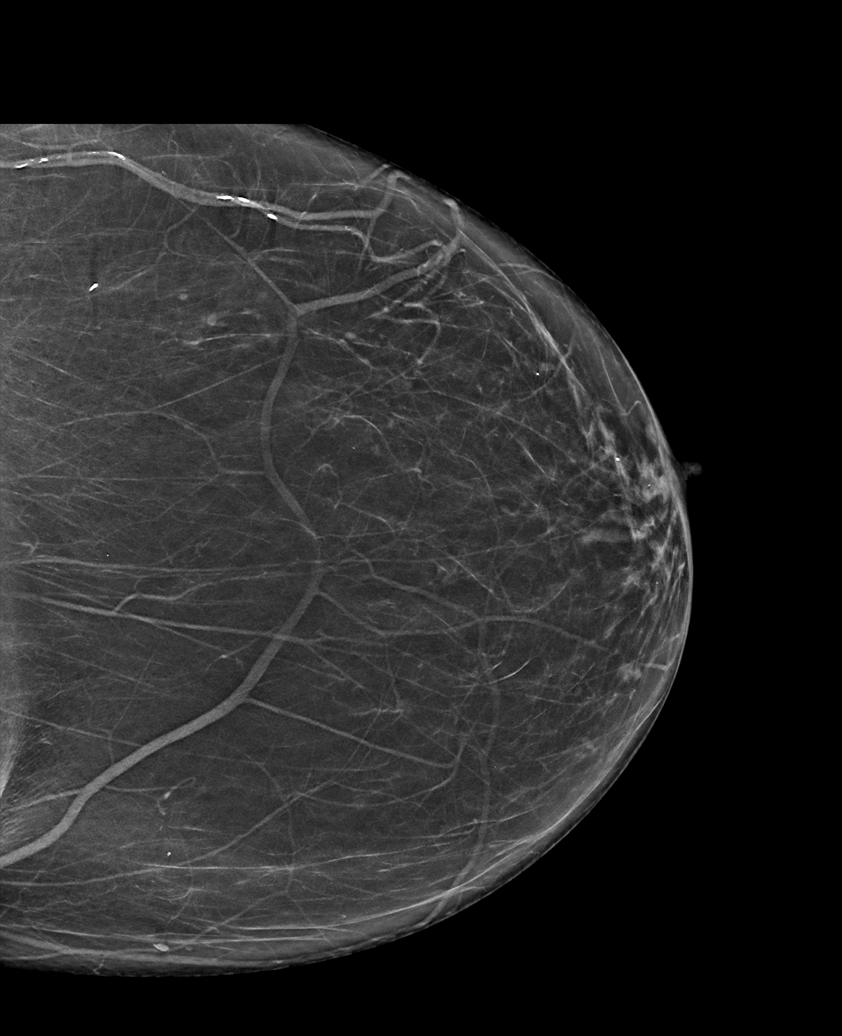

[R MLO tomo · tomo slice 39/78.0]
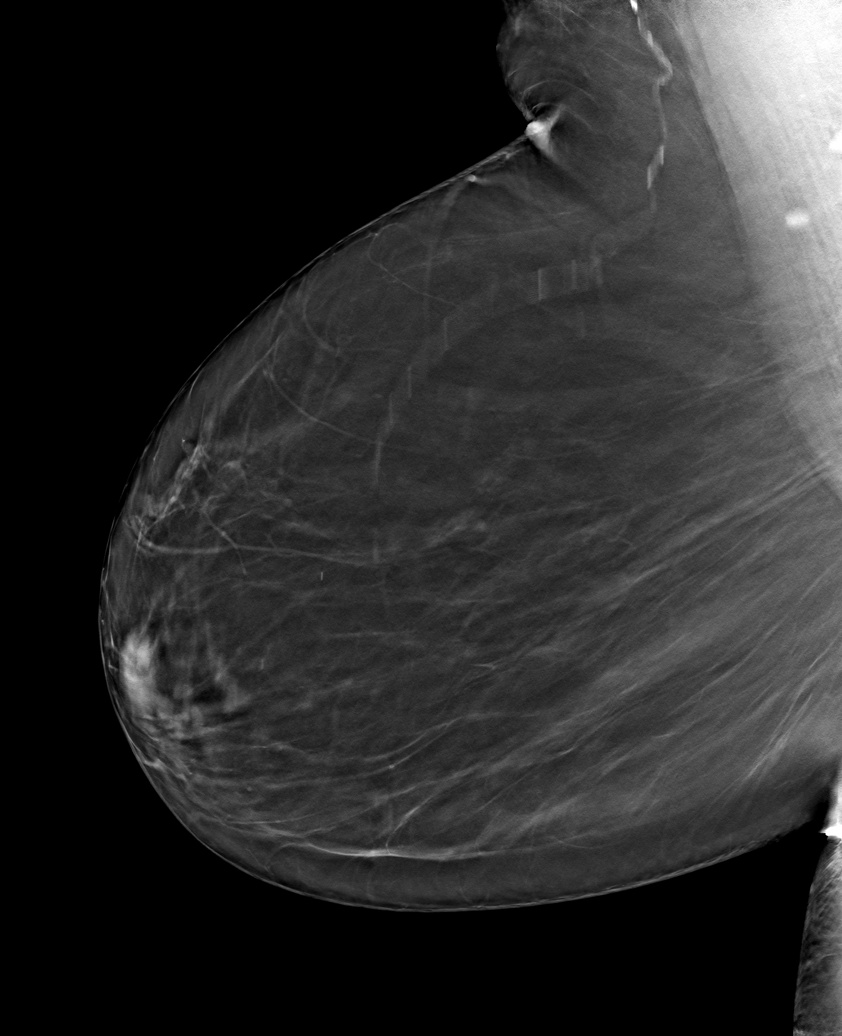

[6 of 30 positions shown; findings below may reference images not displayed]

FINDINGS: There are no findings suspicious for malignancy.
IMPRESSION: No mammographic evidence of malignancy. A result letter of this
screening mammogram will be mailed directly to the patient.

RECOMMENDATION:
Screening mammogram in one year. (Code:0E-3-N98)

BI-RADS CATEGORY  1: Negative.

## 2023-06-10 ENCOUNTER — Ambulatory Visit (INDEPENDENT_AMBULATORY_CARE_PROVIDER_SITE_OTHER): Payer: 59

## 2023-06-10 DIAGNOSIS — I442 Atrioventricular block, complete: Secondary | ICD-10-CM | POA: Diagnosis not present

## 2023-06-12 LAB — CUP PACEART REMOTE DEVICE CHECK
Battery Impedance: 2125 Ohm
Battery Remaining Longevity: 30 mo
Battery Voltage: 2.73 V
Brady Statistic AP VP Percent: 82 %
Brady Statistic AP VS Percent: 1 %
Brady Statistic AS VP Percent: 13 %
Brady Statistic AS VS Percent: 5 %
Date Time Interrogation Session: 20241204153451
Implantable Lead Connection Status: 753985
Implantable Lead Connection Status: 753985
Implantable Lead Implant Date: 20131218
Implantable Lead Implant Date: 20131218
Implantable Lead Location: 753859
Implantable Lead Location: 753860
Implantable Lead Model: 5076
Implantable Lead Model: 5076
Implantable Pulse Generator Implant Date: 20131218
Lead Channel Impedance Value: 510 Ohm
Lead Channel Impedance Value: 518 Ohm
Lead Channel Pacing Threshold Amplitude: 0.5 V
Lead Channel Pacing Threshold Amplitude: 0.75 V
Lead Channel Pacing Threshold Pulse Width: 0.4 ms
Lead Channel Pacing Threshold Pulse Width: 0.4 ms
Lead Channel Setting Pacing Amplitude: 2 V
Lead Channel Setting Pacing Amplitude: 2.5 V
Lead Channel Setting Pacing Pulse Width: 0.4 ms
Lead Channel Setting Sensing Sensitivity: 2 mV
Zone Setting Status: 755011
Zone Setting Status: 755011

## 2023-06-16 LAB — HM DIABETES EYE EXAM

## 2023-06-19 ENCOUNTER — Other Ambulatory Visit: Payer: Self-pay | Admitting: Internal Medicine

## 2023-07-16 ENCOUNTER — Other Ambulatory Visit: Payer: Self-pay

## 2023-07-16 ENCOUNTER — Emergency Department (HOSPITAL_BASED_OUTPATIENT_CLINIC_OR_DEPARTMENT_OTHER)
Admission: EM | Admit: 2023-07-16 | Discharge: 2023-07-16 | Disposition: A | Discharge status: Home / Self Care | Payer: 59 | Attending: Emergency Medicine | Admitting: Emergency Medicine

## 2023-07-16 ENCOUNTER — Emergency Department (HOSPITAL_BASED_OUTPATIENT_CLINIC_OR_DEPARTMENT_OTHER): Payer: 59

## 2023-07-16 ENCOUNTER — Emergency Department (HOSPITAL_BASED_OUTPATIENT_CLINIC_OR_DEPARTMENT_OTHER): Payer: 59 | Admitting: Radiology

## 2023-07-16 ENCOUNTER — Encounter (HOSPITAL_BASED_OUTPATIENT_CLINIC_OR_DEPARTMENT_OTHER): Payer: Self-pay | Admitting: Emergency Medicine

## 2023-07-16 DIAGNOSIS — J449 Chronic obstructive pulmonary disease, unspecified: Secondary | ICD-10-CM | POA: Insufficient documentation

## 2023-07-16 DIAGNOSIS — W01198A Fall on same level from slipping, tripping and stumbling with subsequent striking against other object, initial encounter: Secondary | ICD-10-CM | POA: Diagnosis not present

## 2023-07-16 DIAGNOSIS — R0602 Shortness of breath: Secondary | ICD-10-CM | POA: Diagnosis not present

## 2023-07-16 DIAGNOSIS — S82102A Unspecified fracture of upper end of left tibia, initial encounter for closed fracture: Secondary | ICD-10-CM | POA: Diagnosis not present

## 2023-07-16 DIAGNOSIS — S8992XA Unspecified injury of left lower leg, initial encounter: Secondary | ICD-10-CM | POA: Diagnosis present

## 2023-07-16 LAB — CBC WITH DIFFERENTIAL/PLATELET
Abs Immature Granulocytes: 0.1 10*3/uL — ABNORMAL HIGH (ref 0.00–0.07)
Basophils Absolute: 0 10*3/uL (ref 0.0–0.1)
Basophils Relative: 0 %
Eosinophils Absolute: 0.1 10*3/uL (ref 0.0–0.5)
Eosinophils Relative: 1 %
HCT: 37.4 % (ref 36.0–46.0)
Hemoglobin: 12.1 g/dL (ref 12.0–15.0)
Immature Granulocytes: 1 %
Lymphocytes Relative: 22 %
Lymphs Abs: 2.8 10*3/uL (ref 0.7–4.0)
MCH: 28.5 pg (ref 26.0–34.0)
MCHC: 32.4 g/dL (ref 30.0–36.0)
MCV: 88 fL (ref 80.0–100.0)
Monocytes Absolute: 1.2 10*3/uL — ABNORMAL HIGH (ref 0.1–1.0)
Monocytes Relative: 9 %
Neutro Abs: 8.8 10*3/uL — ABNORMAL HIGH (ref 1.7–7.7)
Neutrophils Relative %: 67 %
Platelets: 371 10*3/uL (ref 150–400)
RBC: 4.25 MIL/uL (ref 3.87–5.11)
RDW: 15.2 % (ref 11.5–15.5)
WBC: 13.1 10*3/uL — ABNORMAL HIGH (ref 4.0–10.5)
nRBC: 0 % (ref 0.0–0.2)

## 2023-07-16 LAB — URINALYSIS, ROUTINE W REFLEX MICROSCOPIC
Bacteria, UA: NONE SEEN
Bilirubin Urine: NEGATIVE
Glucose, UA: 100 mg/dL — AB
Hgb urine dipstick: NEGATIVE
Leukocytes,Ua: NEGATIVE
Nitrite: NEGATIVE
Protein, ur: 100 mg/dL — AB
Specific Gravity, Urine: >1.046 — ABNORMAL HIGH (ref 1.005–1.030)
pH: 6.5 (ref 5.0–8.0)

## 2023-07-16 LAB — BASIC METABOLIC PANEL
Anion gap: 7 (ref 5–15)
BUN: 21 mg/dL (ref 8–23)
CO2: 33 mmol/L — ABNORMAL HIGH (ref 22–32)
Calcium: 9.6 mg/dL (ref 8.9–10.3)
Chloride: 94 mmol/L — ABNORMAL LOW (ref 98–111)
Creatinine, Ser: 0.94 mg/dL (ref 0.44–1.00)
GFR, Estimated: >60 mL/min (ref >60)
Glucose, Bld: 182 mg/dL — ABNORMAL HIGH (ref 70–99)
Potassium: 3.1 mmol/L — ABNORMAL LOW (ref 3.5–5.1)
Sodium: 134 mmol/L — ABNORMAL LOW (ref 135–145)

## 2023-07-16 MED ORDER — DEXAMETHASONE SODIUM PHOSPHATE 10 MG/ML IJ SOLN
10.0000 mg | Freq: Once | INTRAMUSCULAR | Status: AC
  Administered 2023-07-16: 10 mg via INTRAVENOUS
  Filled 2023-07-16: qty 1

## 2023-07-16 MED ORDER — IPRATROPIUM-ALBUTEROL 0.5-2.5 (3) MG/3ML IN SOLN
9.0000 mL | Freq: Once | RESPIRATORY_TRACT | Status: AC
  Administered 2023-07-16: 9 mL via RESPIRATORY_TRACT
  Filled 2023-07-16: qty 3

## 2023-07-16 MED ORDER — POTASSIUM CHLORIDE CRYS ER 20 MEQ PO TBCR
40.0000 meq | EXTENDED_RELEASE_TABLET | Freq: Once | ORAL | Status: AC
  Administered 2023-07-16: 40 meq via ORAL
  Filled 2023-07-16: qty 2

## 2023-07-16 MED ORDER — ACETAMINOPHEN 500 MG PO TABS
1000.0000 mg | ORAL_TABLET | Freq: Once | ORAL | Status: AC
  Administered 2023-07-16: 1000 mg via ORAL
  Filled 2023-07-16: qty 2

## 2023-07-16 MED ORDER — OXYCODONE HCL 5 MG PO TABS
5.0000 mg | ORAL_TABLET | Freq: Once | ORAL | Status: AC
  Administered 2023-07-16: 5 mg via ORAL
  Filled 2023-07-16: qty 1

## 2023-07-16 NOTE — ED Provider Notes (Addendum)
 [facility] AT [facility] Provider Note   CSN: [id number] Arrival date & time: 07/16/23  1158     History Chief Complaint  Patient presents with   Fall    HPI Erika Leach is a 73 y.o. female presenting for a myriad of complaints.  She has been having worsening shortness of breath, she had a ground-level fall.  [place] and hit her left knee.  States that she has COPD with oxygen is needed to use at home but has not needed it up until yesterday.  Denies fevers chills, syncope or chest pain. States that today she immediately lost her balance when trying to walk from the bathroom. She does endorse worsening dyspnea on exertion over the last 72 hours.  Patient's recorded medical, surgical, social, medication list and allergies were reviewed in the Snapshot window as part of the initial history.   Review of Systems   Review of Systems  Constitutional:  Negative for chills and fever.  HENT:  Negative for ear pain and sore throat.   Eyes:  Negative for pain and visual disturbance.  Respiratory:  Positive for shortness of breath. Negative for cough.   Cardiovascular:  Negative for chest pain and palpitations.  Gastrointestinal:  Negative for abdominal pain and vomiting.  Genitourinary:  Negative for dysuria and hematuria.  Musculoskeletal:  Negative for arthralgias and back pain.  Skin:  Negative for color change and rash.  Neurological:  Positive for weakness. Negative for seizures and syncope.  All other systems reviewed and are negative.   Physical Exam Updated Vital Signs BP 139/66   Pulse 60   Temp 97.7 F (36.5 C)   Resp 20   LMP  (LMP Unknown)   SpO2 97%  Physical Exam Vitals and nursing note reviewed.  Constitutional:      General: She is not in acute distress.    Appearance: She is well-developed.  HENT:     Head: Normocephalic and atraumatic.  Eyes:     Conjunctiva/sclera: Conjunctivae normal.  Cardiovascular:     Rate and Rhythm:  Normal rate and regular rhythm.     Heart sounds: No murmur heard. Pulmonary:     Effort: Pulmonary effort is normal. No respiratory distress.     Breath sounds: Normal breath sounds.  Abdominal:     Palpations: Abdomen is soft.     Tenderness: There is no abdominal tenderness.  Musculoskeletal:        General: Signs of injury (Exquisite TTP of the left knee.  Able to tolerate passive range of motion.  Surrounding swelling.  Patient states feeling is chronic in nature.) present. No swelling.     Cervical back: Neck supple.  Skin:    General: Skin is warm and dry.     Capillary Refill: Capillary refill takes less than 2 seconds.  Neurological:     Mental Status: She is alert.  Psychiatric:        Mood and Affect: Mood normal.      ED Course/ Medical Decision Making/ A&P Clinical Course as of 07/16/23 2004  Wed Jul 16, 2023  1509 DG Knee Complete 4 Views Left Possible fracture? Correlation needed. [CC]    Clinical Course User Index [CC] Jerral Meth, MD    Procedures Procedures   Medications Ordered in ED Medications  ipratropium-albuterol  (DUONEB) 0.5-2.5 (3) MG/3ML nebulizer solution 9 mL (9 mLs Nebulization Given 07/16/23 1558)  dexamethasone  (DECADRON ) injection 10 mg (10 mg Intravenous Given 07/16/23 1615)  acetaminophen  (TYLENOL )  tablet 1,000 mg (1,000 mg Oral Given 07/16/23 1745)  oxyCODONE  (Oxy IR/ROXICODONE ) immediate release tablet 5 mg (5 mg Oral Given 07/16/23 1746)  potassium chloride  SA (KLOR-CON  M) CR tablet 40 mEq (40 mEq Oral Given 07/16/23 1746)    Medical Decision Making:   Patient's history of present illness and his physical exam findings are most consistent with 2 separate pathologies. Patient has left knee swelling and tenderness to palpation.  She states that the left leg has been swollen for a few days.  This might be a DVT, dependent edema, or from musculoskeletal injury.  An x-ray was performed that shows some atypical presentation of her left knee  and she is having substantial pain at the site.  I will consult orthopedics and further recommendations of possible tibial plateau fracture with clinical findings concerning for acute localizing pain.  Concerning patient's shortness of breath, this seems to be a COPD exacerbation fortunately relatively mild that she is tolerating her home as needed oxygen. Will treat with steroids (discussed risk of hyperglycemia) and breathing treatments as well as get a chest x-ray and blood work to rule out acute pneumonia.  Family also endorsed some confusion earlier in the day, will get a UA to add on to evaluate for UTI.  Reassessment: Labs reviewed, images reviewed.  These demonstrated possible fracture. Referred to orthopedics. Patient offered admission due to difficulty with ambulating, however she declined.  Stated she would follow-up with orthopedics in the outpatient setting.  She understood risk of falls and worsening pathology but would still insistent on outpatient care and management.  Patient could be admitted for hospitalization for ADLs until urgent comanagement but patient declined.  Concerning her shortness of breath, it is now completely resolved on serial reassessment.  Patient feels comfortable outpatient care management.  Will hold on further steroids until operative management is achieved. Sign Clinical Impression:  1. SOB (shortness of breath)   2. Closed fracture of proximal end of left tibia, unspecified fracture morphology, initial encounter      Discharge   Final Clinical Impression(s) / ED Diagnoses Final diagnoses:  SOB (shortness of breath)  Closed fracture of proximal end of left tibia, unspecified fracture morphology, initial encounter    Rx / DC Orders ED Discharge Orders     None         Jerral Meth, MD 07/16/23 2004    Jerral Meth, MD 07/16/23 2004

## 2023-07-16 NOTE — ED Triage Notes (Signed)
 EMS from home Fall around 4AM, on floor x 1 hour uses walker, unsure how she fell Denies hitting head, denies LOC Aox 4 in triage [id number]  Hx falling Left knee pain  Copd o2 at home  Ems vs  122/66 86% on RA 79 Cbg 217

## 2023-07-16 NOTE — ED Notes (Signed)
 ED Provider at bedside.

## 2023-07-22 ENCOUNTER — Ambulatory Visit (INDEPENDENT_AMBULATORY_CARE_PROVIDER_SITE_OTHER): Payer: 59 | Admitting: Physician Assistant

## 2023-07-22 ENCOUNTER — Encounter: Payer: Self-pay | Admitting: Physician Assistant

## 2023-07-22 DIAGNOSIS — M1712 Unilateral primary osteoarthritis, left knee: Secondary | ICD-10-CM

## 2023-07-22 DIAGNOSIS — M25562 Pain in left knee: Secondary | ICD-10-CM | POA: Insufficient documentation

## 2023-07-22 NOTE — Progress Notes (Signed)
 Office Visit Note   Patient: Erika Leach           Date of Birth: 12-Jan-1951           MRN: [id number] Visit Date: 07/22/2023              Requested by: Maree Leni Edyth DELENA, MD [address] [place],  [place] [address] PCP: Maree Leni Edyth DELENA, MD   Assessment & Plan: Visit Diagnoses: Arthritis left knee left knee pain  Plan: Patient is 5 days status post ground-level fall.  She said she landed on her back is unsure if how she hit her knee.  She was evaluated the emergency room where she had complained of medial knee pain.  She always has had trouble with that knee from a previous remote injury.  She normally uses a walker.  She denies any significant bruising at the time of the injury.  She says her knee feels a little bit better today.  She was given a knee immobilizer but could not tolerate it.  Because she feels a little bit better I like to get her into a hinged knee brace and limiting her weightbearing as tolerated in the walker.  Will reexamine her in 1 week if no significant improvement would get a CT scan of her knee however I do not think this would be an operative fracture favor more a flare in her degenerative symptoms.  Her x-rays that were done show quite a bit degenerative changes question small step-off off the medial tibial plateau versus a chronic osteophyte.  Also chondrocalcinosis.  No definite fracture seen  Follow-Up Instructions: Return in about 1 week (around 07/29/2023).   Orders:  No orders of the defined types were placed in this encounter.  No orders of the defined types were placed in this encounter.     Procedures: No procedures performed   Clinical Data: No additional findings.   Subjective: Chief Complaint  Patient presents with   Left Knee - Pain    HPI pleasant 73 year old woman who is 5 days status post fall at her home.  Ground-level.  Has a history of arthritis in her left knee.  Said the night of the fall she had a flareup of her knee    Review of Systems  All other systems reviewed and are negative.    Objective: Vital Signs: LMP  (LMP Unknown)   Physical Exam Constitutional:      Appearance: Normal appearance.  Pulmonary:     Effort: Pulmonary effort is normal.  Skin:    General: Skin is warm and dry.  Neurological:     General: No focal deficit present.     Mental Status: She is alert and oriented to person, place, and time.  Psychiatric:        Mood and Affect: Mood normal.        Behavior: Behavior normal.    Ortho Exam Left knee she has a mild effusion no redness no erythema she has good extension and flexion of her leg she is tender to palpation over the medial compartment none laterally she is neurologically intact compartments soft and nontender negative Homans' sign  Specialty Comments:  No specialty comments available.  Imaging: No results found.   PMFS History: Patient Active Problem List   Diagnosis Date Noted   Former cigarette smoker 01/23/2023   COVID-19    Acute on chronic respiratory failure with hypoxia (HCC)    Hypomagnesemia    Acute hypoxemic  respiratory failure due to COVID-19 (HCC) 07/06/2019   DM (diabetes mellitus) (HCC) 07/06/2019   HLD (hyperlipidemia) 07/06/2019   Hypokalemia 07/06/2019   AKI (acute kidney injury) (HCC) 07/06/2019   Thrush, oral 05/04/2019   Chronic respiratory failure with hypoxia (HCC) 04/02/2019   Hypoxemia    Acute respiratory failure (HCC) 03/21/2019   COPD exacerbation (HCC) 03/20/2019   Morbid obesity (HCC) 11/29/2015   COPD GOLD III 11/01/2015   OSA (obstructive sleep apnea) 12/04/2014   Dyspnea on exertion 07/09/2013   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   Essential hypertension 06/28/2012   Mild pulmonary hypertension (HCC) 06/28/2012   Asthma 06/28/2012   Complete heart block (HCC) 06/24/2012   Past Medical History:  Diagnosis Date   Asthma    Chest pain 2014   Cath 10/14>>normal CA   Complete heart block (HCC) 06/24/2012   HTN  (hypertension)    poorly controlled   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    Family History  Problem Relation Age of Onset   Heart attack Mother        Died age 43   Pneumonia Father    Breast cancer Sister    Heart attack Brother        3 MI's - 1st age 57   Ovarian cancer Sister    Emphysema Brother        smoked    Past Surgical History:  Procedure Laterality Date   APPENDECTOMY     LEFT HEART CATHETERIZATION WITH CORONARY ANGIOGRAM N/A 04/30/2013   Procedure: LEFT HEART CATHETERIZATION WITH CORONARY ANGIOGRAM;  Surgeon: Peter M Jordan, MD;  Location: [facility] CATH LAB;  Service: Cardiovascular;  Laterality: N/A;   PERMANENT PACEMAKER INSERTION N/A 06/24/2012   Procedure: PERMANENT PACEMAKER INSERTION;  Surgeon: Elspeth JAYSON Sage, MD;  Location: [facility] CATH LAB;  Service: Cardiovascular;  Laterality: N/A;   TUBAL LIGATION     Social History   Occupational History   Occupation: [employer]  Tobacco Use   Smoking status: Former    Current packs/day: 0.00    Average packs/day: 1 pack/day for 40.0 years (40.0 ttl pk-yrs)    Types: Cigarettes    Start date: 07/08/1972    Quit date: 07/08/2012    Years since quitting: 11.0   Smokeless tobacco: Never  Vaping Use   Vaping status: Never Used  Substance and Sexual Activity   Alcohol use: No    Alcohol/week: 0.0 standard drinks of alcohol    Comment: rare - special occasions   Drug use: No   Sexual activity: Not on file

## 2023-07-29 ENCOUNTER — Ambulatory Visit (INDEPENDENT_AMBULATORY_CARE_PROVIDER_SITE_OTHER): Payer: 59 | Admitting: Physician Assistant

## 2023-07-29 ENCOUNTER — Encounter: Payer: Self-pay | Admitting: Physician Assistant

## 2023-07-29 DIAGNOSIS — M1712 Unilateral primary osteoarthritis, left knee: Secondary | ICD-10-CM | POA: Diagnosis not present

## 2023-07-29 NOTE — Progress Notes (Signed)
Office Visit Note   Patient: Erika Leach           Date of Birth: [birth year]           MRN: [id number] Visit Date: 07/29/2023              Requested by: Ellyn Hack, MD [address] [place],  [place] [address] PCP: Ellyn Hack, MD  Chief Complaint  Patient presents with   Left Knee - Follow-up      HPI: Erika Leach is a pleasant 73 year old woman with a history of left knee arthritis that sustained a fall onto the inside of her left knee about a week ago.  She has significant pain and was given a brace a week ago.  She does say the brace is hard for her to put on.  She also says she is doing a lot better.  Pain is still focused medially but significantly improved  Assessment & Plan: Visit Diagnoses: Left knee contusion  Plan: Clinically she is doing a lot better.  Seems to be just focally tender medially but allows me to palpate the joint line has good extension and flexion of her knee.  Will continue to follow this would like to see her for final time in 3 weeks.  If she had any increasing symptoms she could call me and I can get a CT scan  Follow-Up Instructions: Return in about 3 weeks (around 08/19/2023).   Ortho Exam  Patient is alert, oriented, no adenopathy, well-dressed, normal affect, normal respiratory effort. Left knee no effusion no erythema knee is not warm.  She does have some bruising along the medial side of her knee.  Has some tenderness but palpate she and over the medial joint line but does tolerate we doing this no tenderness really over the proximal tibia good active extension and flexion of her knee compartments are soft and nontender  Imaging: No results found. No images are attached to the encounter.  Labs: Lab Results  Component Value Date   HGBA1C 7.9 (H) 07/06/2019   HGBA1C 7.8 (H) 03/21/2019   CRP 2.0 (H) 07/10/2019   CRP 3.8 (H) 07/09/2019   CRP 7.2 (H) 07/08/2019   REPTSTATUS 07/11/2019 FINAL 07/06/2019   REPTSTATUS 07/11/2019  FINAL 07/06/2019   CULT  07/06/2019    NO GROWTH 5 DAYS Performed at [facility], [address], [place], [place] [address]    CULT  07/06/2019    NO GROWTH 5 DAYS Performed at [facility], [address] [place], [place] [address]      Lab Results  Component Value Date   ALBUMIN 3.5 07/10/2019   ALBUMIN 3.6 07/09/2019   ALBUMIN 3.6 07/08/2019    Lab Results  Component Value Date   MG 1.9 07/10/2019   MG 2.0 07/09/2019   MG 1.4 (L) 07/08/2019   No results found for: "VD25OH"  No results found for: "PREALBUMIN"    Latest Ref Rng & Units 07/16/2023    4:12 PM 07/10/2019    3:24 AM 07/09/2019    2:46 AM  CBC EXTENDED  WBC 4.0 - 10.5 K/uL 13.1  10.3  11.3   RBC 3.87 - 5.11 MIL/uL 4.25  4.50  4.49   Hemoglobin 12.0 - 15.0 g/dL 29.5  28.4  13.2   HCT 36.0 - 46.0 % 37.4  37.8  37.9   Platelets 150 - 400 K/uL 371  411  443   NEUT# 1.7 -  7.7 K/uL 8.8  7.0  8.4   Lymph# 0.7 - 4.0 K/uL 2.8  1.8  1.6      There is no height or weight on file to calculate BMI.  Orders:  No orders of the defined types were placed in this encounter.  No orders of the defined types were placed in this encounter.    Procedures: No procedures performed  Clinical Data: No additional findings.  ROS:  All other systems negative, except as noted in the HPI. Review of Systems  Objective: Vital Signs: LMP  (LMP Unknown)   Specialty Comments:  No specialty comments available.  PMFS History: Patient Active Problem List   Diagnosis Date Noted   Pain in left knee 07/22/2023   Former cigarette smoker 01/23/2023   COVID-19    Acute on chronic respiratory failure with hypoxia (HCC)    Hypomagnesemia    Acute hypoxemic respiratory failure due to COVID-19 (HCC) 07/06/2019   DM (diabetes mellitus) (HCC) 07/06/2019   HLD (hyperlipidemia) 07/06/2019   Hypokalemia 07/06/2019   AKI (acute kidney injury) (HCC) 07/06/2019   Thrush, oral 05/04/2019   Chronic respiratory failure  with hypoxia (HCC) 04/02/2019   Hypoxemia    Acute respiratory failure (HCC) 03/21/2019   COPD exacerbation (HCC) 03/20/2019   Morbid obesity (HCC) 11/29/2015   COPD GOLD III 11/01/2015   OSA (obstructive sleep apnea) 12/04/2014   Dyspnea on exertion 07/09/2013   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   Essential hypertension 06/28/2012   Mild pulmonary hypertension (HCC) 06/28/2012   Asthma 06/28/2012   Complete heart block (HCC) 06/24/2012   Past Medical History:  Diagnosis Date   Asthma    Chest pain 2014   Cath 10/14>>normal CA   Complete heart block (HCC) 06/24/2012   HTN (hypertension)    poorly controlled   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    Family History  Problem Relation Age of Onset   Heart attack Mother        Died age 85   Pneumonia Father    Breast cancer Sister    Heart attack Brother        3 MI's - 1st age 73   Ovarian cancer Sister    Emphysema Brother        smoked    Past Surgical History:  Procedure Laterality Date   APPENDECTOMY     LEFT HEART CATHETERIZATION WITH CORONARY ANGIOGRAM N/A 04/30/2013   Procedure: LEFT HEART CATHETERIZATION WITH CORONARY ANGIOGRAM;  Surgeon: Peter M Swaziland, MD;  Location: [facility] CATH LAB;  Service: Cardiovascular;  Laterality: N/A;   PERMANENT PACEMAKER INSERTION N/A 06/24/2012   Procedure: PERMANENT PACEMAKER INSERTION;  Surgeon: Duke Salvia, MD;  Location: [facility] CATH LAB;  Service: Cardiovascular;  Laterality: N/A;   TUBAL LIGATION     Social History   Occupational History   Occupation: [employer]  Tobacco Use   Smoking status: Former    Current packs/day: 0.00    Average packs/day: 1 pack/day for 40.0 years (40.0 ttl pk-yrs)    Types: Cigarettes    Start date: 07/08/1972    Quit date: 07/08/2012    Years since quitting: 11.0   Smokeless tobacco: Never  Vaping Use   Vaping status: Never Used  Substance and Sexual Activity   Alcohol use: No    Alcohol/week: 0.0 standard drinks of alcohol    Comment: rare - special occasions    Drug use: No   Sexual activity: Not on file

## 2023-08-06 ENCOUNTER — Telehealth: Payer: Self-pay

## 2023-08-06 DIAGNOSIS — Z87891 Personal history of nicotine dependence: Secondary | ICD-10-CM

## 2023-08-06 DIAGNOSIS — Z122 Encounter for screening for malignant neoplasm of respiratory organs: Secondary | ICD-10-CM

## 2023-08-06 NOTE — Telephone Encounter (Signed)
.  Lung Cancer Screening Narrative/Criteria Questionnaire (Cigarette Smokers Only- No Cigars/Pipes/vapes)   EVOLEHT HOVATTER   SDMV: 08/18/2023 @11 :00am with Marcelline Mates, RN   15-Jun-1951   LDCT: 08/22/2023 @ 4:00pm     72 y.o.   Phone: [phone]  Lung Screening Narrative (confirm age 60-77 yrs Medicare / 50-80 yrs Private pay insurance)   Insurance information:[employer]   Referring Provider:Wert   This screening involves an initial phone call with a team member from our program. It is called a shared decision making visit. The initial meeting is required by  insurance and Medicare to make sure you understand the program. This appointment takes about 15-20 minutes to complete. You will complete the screening scan at your scheduled date/time.  This scan takes about 5-10 minutes to complete. You can eat and drink normally before and after the scan.  Criteria questions for Lung Cancer Screening:   Are you a current or former smoker? Former Age began smoking: 16   If you are a former smoker, what year did you quit smoking?2015 (within 15 yrs)   To calculate your smoking history, I need an accurate estimate of how many packs of cigarettes you smoked per day and for how many years. (Not just the number of PPD you are now smoking)   Years smoking 46 x Packs per day 1 = Pack years 46   (at least 20 pack yrs)   (Make sure they understand that we need to know how much they have smoked in the past, not just the number of PPD they are smoking now)  Do you have a personal history of cancer?  No    Do you have a family history of cancer? Yes  (cancer type and and relative) Sister breast Mother  Ovarian   Are you coughing up blood?  No  Have you had unexplained weight loss of 15 lbs or more in the last 6 months? No  It looks like you meet all criteria.  When would be a good time for [place] to schedule you for this screening?   Additional information:

## 2023-08-18 ENCOUNTER — Ambulatory Visit (INDEPENDENT_AMBULATORY_CARE_PROVIDER_SITE_OTHER): Payer: 59 | Admitting: Acute Care

## 2023-08-18 DIAGNOSIS — Z87891 Personal history of nicotine dependence: Secondary | ICD-10-CM | POA: Diagnosis not present

## 2023-08-18 NOTE — Progress Notes (Addendum)
 Provider Attestation I agree with the documentation of the Shared Decision Making visit,  smoking cessation counseling if appropriate, and verification or eligibility for lung cancer screening as documented by the RN Nurse Navigator.   Raejean Bullock, MSN, AGACNP-BC [facility] See Amion for personal pager PCCM on call pager [phone]     Virtual Visit via Telephone Note  I connected with Erika Leach on 08/18/23 at 11:00 AM EST by telephone and verified that I am speaking with the correct person using two identifiers.  Location: Patient: Erika Leach Provider: Alyse Bach, RN   I discussed the limitations, risks, security and privacy concerns of performing an evaluation and management service by telephone and the availability of in person appointments. I also discussed with the patient that there may be a patient responsible charge related to this service. The patient expressed understanding and agreed to proceed.    Shared Decision Making Visit Lung Cancer Screening Program [phone])   Eligibility: Age 73 y.o. Pack Years Smoking History Calculation 46 (# packs/per year x # years smoked) Recent History of coughing up blood  no Unexplained weight loss? no ( >Than 15 pounds within the last 6 months ) Prior History Lung / other cancer no (Diagnosis within the last 5 years already requiring surveillance chest CT Scans). Smoking Status Former Smoker Former Smokers: Years since quit: 10 years  Quit Date: 2015  Visit Components: Discussion included one or more decision making aids. yes Discussion included risk/benefits of screening. yes Discussion included potential follow up diagnostic testing for abnormal scans. yes Discussion included meaning and risk of over diagnosis. yes Discussion included meaning and risk of False Positives. yes Discussion included meaning of total radiation exposure. yes  Counseling Included: Importance of  adherence to annual lung cancer LDCT screening. yes Impact of comorbidities on ability to participate in the program. yes Ability and willingness to under diagnostic treatment. yes  Smoking Cessation Counseling: Current Smokers:  Discussed importance of smoking cessation. yes Information about tobacco cessation classes and interventions provided to patient. yes Patient provided with "ticket" for LDCT Scan. yes Symptomatic Patient. yes  Counseling(Intermediate counseling: > three minutes) [address]406 Diagnosis Code: Tobacco Use Z72.0 Asymptomatic Patient no  Counseling (Intermediate counseling: > three minutes counseling) B1478 Former Smokers:  Discussed the importance of maintaining cigarette abstinence. yes Diagnosis Code: Personal History of Nicotine Dependence. G95.621 Information about tobacco cessation classes and interventions provided to patient. Yes Patient provided with "ticket" for LDCT Scan. no Written Order for Lung Cancer Screening with LDCT placed in Epic. Yes (CT Chest Lung Cancer Screening Low Dose W/O CM) [id number] Z12.2-Screening of respiratory organs Z87.891-Personal history of nicotine dependence   Alyse Bach, RN

## 2023-08-18 NOTE — Patient Instructions (Signed)

## 2023-08-19 ENCOUNTER — Ambulatory Visit: Payer: 59 | Admitting: Physician Assistant

## 2023-08-22 ENCOUNTER — Ambulatory Visit
Admission: RE | Admit: 2023-08-22 | Discharge: 2023-08-22 | Disposition: A | Discharge status: Home / Self Care | Payer: 59 | Source: Ambulatory Visit | Attending: Family Medicine | Admitting: Family Medicine

## 2023-08-22 DIAGNOSIS — Z87891 Personal history of nicotine dependence: Secondary | ICD-10-CM

## 2023-08-22 DIAGNOSIS — Z122 Encounter for screening for malignant neoplasm of respiratory organs: Secondary | ICD-10-CM

## 2023-08-29 ENCOUNTER — Ambulatory Visit (INDEPENDENT_AMBULATORY_CARE_PROVIDER_SITE_OTHER): Payer: 59 | Admitting: Nurse Practitioner

## 2023-08-29 ENCOUNTER — Encounter: Payer: Self-pay | Admitting: Nurse Practitioner

## 2023-08-29 VITALS — BP 124/88 | HR 60 | Temp 97.2°F | Resp 18 | Ht 65.0 in | Wt 171.8 lb

## 2023-08-29 DIAGNOSIS — E1169 Type 2 diabetes mellitus with other specified complication: Secondary | ICD-10-CM | POA: Diagnosis not present

## 2023-08-29 DIAGNOSIS — K219 Gastro-esophageal reflux disease without esophagitis: Secondary | ICD-10-CM

## 2023-08-29 DIAGNOSIS — Z794 Long term (current) use of insulin: Secondary | ICD-10-CM | POA: Diagnosis not present

## 2023-08-29 DIAGNOSIS — E785 Hyperlipidemia, unspecified: Secondary | ICD-10-CM

## 2023-08-29 DIAGNOSIS — M150 Primary generalized (osteo)arthritis: Secondary | ICD-10-CM

## 2023-08-29 DIAGNOSIS — I10 Essential (primary) hypertension: Secondary | ICD-10-CM

## 2023-08-29 DIAGNOSIS — G4733 Obstructive sleep apnea (adult) (pediatric): Secondary | ICD-10-CM

## 2023-08-29 DIAGNOSIS — J449 Chronic obstructive pulmonary disease, unspecified: Secondary | ICD-10-CM

## 2023-08-29 MED ORDER — MELOXICAM 7.5 MG PO TABS: 7.5000 mg | ORAL_TABLET | Freq: Every day | ORAL | Status: DC

## 2023-08-29 MED ORDER — BLOOD GLUCOSE TEST VI STRP: 1.0000 | ORAL_STRIP | Freq: Every day | 3 refills | Status: AC

## 2023-08-29 MED ORDER — LANCETS MISC. MISC: 1.0000 | Freq: Every day | 3 refills | Status: DC

## 2023-08-29 MED ORDER — LANCET DEVICE MISC: 1.0000 | Freq: Every day | 0 refills | Status: DC

## 2023-08-29 MED ORDER — GABAPENTIN 300 MG PO CAPS: 300.0000 mg | ORAL_CAPSULE | Freq: Two times a day (BID) | ORAL | Status: AC

## 2023-08-29 MED ORDER — BLOOD GLUCOSE MONITORING SUPPL DEVI: 1.0000 | Freq: Every day | 0 refills | Status: DC

## 2023-08-29 NOTE — Patient Instructions (Addendum)
Tylenol 1000 mg by mouth every 8 hours as needed for pain.

## 2023-08-29 NOTE — Progress Notes (Signed)
Careteam: Patient Care Team: [name], NP as PCP - General (Geriatric Medicine)  PLACE OF SERVICE:  [facility] CLINIC  Advanced Directive information Does Patient Have a Medical Advance Directive?: No, Would patient like information on creating a medical advance directive?: No - Patient declined  No Known Allergies  Chief Complaint  Patient presents with   Establish Care    new patient Patient has her medication with her.    Discussed the use of AI scribe software for clinical note transcription with the patient, who gave verbal consent to proceed.  History of Present Illness   Erika Leach is a 73 year old female with COPD, asthma, hypertension, and diabetes who presents to establish care.  She has a history of COPD and asthma, managed with Trelegy daily and albuterol as needed. She uses an albuterol inhaler approximately once a day and is on oxygen therapy. She has not seen her pulmonologist recently and was last informed that her COPD was progressing to stage two. She experienced a severe exacerbation recently, which she described as a 'near death experience'. Breathing well at this time. No increase in shortness of breath, cough or congestion.   Her hypertension is managed with amlodipine 10 mg daily, bisoprolol once a day, clonidine 0.1 mg twice a day, and hydrochlorothiazide 12.5 mg once a day. She follows a low sodium diet, though she adds a little salt to her food.  Her diabetes is managed with glipizide 10 mg twice a day before meals, metformin twice a day with meals, and Rybelsus 3 mg daily. She has experienced shaking episodes but has not been checking her blood sugar due to not having a meter. She reports weight loss since starting Rybelsus and denies any significant gastrointestinal side effects.  She has a history of congestive heart failure and a pacemaker, with swelling in her legs when on her feet for extended periods. No chest pain is reported.  She takes  gabapentin 300 mg three times a day for numbness and tingling in her feet, though she is unsure if it helps.  She experiences knee pain and uses meloxicam sparingly, about once or twice a week. She has a history of a fall.   She has a history of sleep apnea but does not use a CPAP machine as it disrupts her sleep and unable to tolerate.   Her family history includes her mother who died of a massive heart attack at 57 and had ovarian cancer, and her father who died of pneumonia at 56. She has one sister who had breast cancer and one brother with emphysema and heart disease.  She is widowed with three children, a former smoker who quit in 2015 after 46 years, and does not consume alcohol or recreational drugs. She is not currently sexually active.      Review of Systems:  Review of Systems  Constitutional:  Negative for chills, fever and weight loss.  HENT:  Negative for tinnitus.   Respiratory:  Positive for cough and shortness of breath. Negative for sputum production.   Cardiovascular:  Negative for chest pain, palpitations and leg swelling.  Gastrointestinal:  Negative for abdominal pain, constipation, diarrhea and heartburn.  Genitourinary:  Negative for dysuria, frequency and urgency.  Musculoskeletal:  Negative for back pain, falls, joint pain and myalgias.  Skin: Negative.   Neurological:  Negative for dizziness and headaches.  Psychiatric/Behavioral:  Negative for depression and memory loss. The patient does not have insomnia.  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   COPD (chronic obstructive pulmonary disease) (HCC)    Diabetes (HCC)    HTN (hypertension)    poorly controlled   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   Past Surgical History:  Procedure Laterality Date   APPENDECTOMY     LEFT HEART CATHETERIZATION WITH CORONARY ANGIOGRAM  N/A 04/30/2013   Procedure: LEFT HEART CATHETERIZATION WITH CORONARY ANGIOGRAM;  Surgeon: Peter M Swaziland, MD;  Location: [facility] CATH LAB;  Service: Cardiovascular;  Laterality: N/A;   PERMANENT PACEMAKER INSERTION N/A 06/24/2012   Procedure: PERMANENT PACEMAKER INSERTION;  Surgeon: Duke Salvia, MD;  Location: [facility] CATH LAB;  Service: Cardiovascular;  Laterality: N/A;   TONSILLECTOMY  1965   TUBAL LIGATION     Social History:   reports that she quit smoking about 10 years ago. Her smoking use included cigarettes. She started smoking about 56 years ago. She has a 46 pack-year smoking history. She has never used smokeless tobacco. She reports that she does not drink alcohol and does not use drugs.  Family History  Problem Relation Age of Onset   Ovarian cancer Mother    Heart attack Mother        Died age 30   Pneumonia Father    Breast cancer Sister    Emphysema Brother    Heart attack Brother        3 MI's - 1st age 65    Medications: Patient's Medications  New Prescriptions   No medications on file  Previous Medications   ALBUTEROL (PROVENTIL) (2.5 MG/3ML) 0.083% NEBULIZER SOLUTION    Take 3 mLs (2.5 mg total) by nebulization every 6 (six) hours as needed for wheezing or shortness of breath.   ALBUTEROL (VENTOLIN HFA) 108 (90 BASE) MCG/ACT INHALER    Inhale 2 puffs into the lungs every 6 (six) hours as needed for wheezing or shortness of breath.   AMLODIPINE (NORVASC) 10 MG TABLET    Take 1 tablet (10 mg total) by mouth daily.   BISOPROLOL (ZEBETA) 5 MG TABLET    TAKE 1 TABLET (5 MG TOTAL) BY MOUTH DAILY.   CLONIDINE (CATAPRES) 0.1 MG TABLET    Take 1 tablet (0.1 mg total) by mouth 2 (two) times daily.   FAMOTIDINE (PEPCID) 20 MG TABLET    TAKE ONE TABLET BY MOUTH DAILY AT 5PM AFTER SUPPER   GLIPIZIDE (GLUCOTROL) 10 MG TABLET    Take 10 mg by mouth 2 (two) times daily.   HYDROCHLOROTHIAZIDE (MICROZIDE) 12.5 MG CAPSULE    Take 12.5 mg by mouth daily.   METFORMIN (GLUCOPHAGE) 500 MG  TABLET    Take 500 mg by mouth 2 (two) times daily.   ROSUVASTATIN (CRESTOR) 20 MG TABLET    Take 20 mg by mouth every morning.   RYBELSUS 3 MG TABS    Take 1 tablet by mouth every morning.   TRELEGY ELLIPTA 100-62.5-25 MCG/ACT AEPB    TAKE 1 PUFF BY MOUTH EVERY DAY  Modified Medications   Modified Medication Previous Medication   GABAPENTIN (NEURONTIN) 300 MG CAPSULE gabapentin (NEURONTIN) 300 MG capsule      Take 1 capsule (300 mg total) by mouth 2 (two) times daily.    Take 1 capsule by mouth 3 (three) times daily.   MELOXICAM (MOBIC) 7.5 MG TABLET meloxicam (MOBIC) 15 MG tablet  Take 1 tablet (7.5 mg total) by mouth daily.    Take 7.5 mg by mouth daily.  Discontinued Medications   ASCORBIC ACID (VITAMIN C) 500 MG TABLET    Take 1 tablet (500 mg total) by mouth daily.   CLOTRIMAZOLE (LOTRIMIN) 1 % CREAM    Apply 1 application topically 2 (two) times daily.   HYDROCODONE-ACETAMINOPHEN (NORCO/VICODIN) 5-325 MG TABLET    Take 1 tablet by mouth 3 (three) times daily as needed.   LORATADINE (CLARITIN) 10 MG TABLET    Take 1 tablet (10 mg total) by mouth daily.   NYSTATIN (MYCOSTATIN) 100000 UNIT/ML SUSPENSION    Use as directed 5 mLs in the mouth or throat.   PANTOPRAZOLE (PROTONIX) 40 MG TABLET    Take 1 tablet (40 mg total) by mouth daily. Take 30-60 min before first meal of the day   TIOTROPIUM BROMIDE-OLODATEROL (STIOLTO RESPIMAT) 2.5-2.5 MCG/ACT AERS    INHALE TWO PUFFS INTO LUNGS EVERY DAY    Physical Exam:  Vitals:   08/29/23 0956  BP: 124/88  Pulse: 60  Resp: 18  Temp: (!) 97.2 F (36.2 C)  SpO2: 90%  Weight: 171 lb 12.8 oz (77.9 kg)  Height: 5\' 5"  (1.651 m)   Body mass index is 28.59 kg/m. Wt Readings from Last 3 Encounters:  08/29/23 171 lb 12.8 oz (77.9 kg)  01/23/23 188 lb 12.8 oz (85.6 kg)  09/11/21 177 lb 6.4 oz (80.5 kg)    Physical Exam Constitutional:      General: She is not in acute distress.    Appearance: She is well-developed. She is not  diaphoretic.  HENT:     Head: Normocephalic and atraumatic.     Mouth/Throat:     Pharynx: No oropharyngeal exudate.  Eyes:     Conjunctiva/sclera: Conjunctivae normal.     Pupils: Pupils are equal, round, and reactive to light.  Cardiovascular:     Rate and Rhythm: Normal rate and regular rhythm.     Heart sounds: Normal heart sounds.  Pulmonary:     Effort: Pulmonary effort is normal.     Breath sounds: Normal breath sounds.  Abdominal:     General: Bowel sounds are normal.     Palpations: Abdomen is soft.  Musculoskeletal:     Cervical back: Normal range of motion and neck supple.     Right lower leg: No edema.     Left lower leg: No edema.  Skin:    General: Skin is warm and dry.  Neurological:     Mental Status: She is alert.  Psychiatric:        Mood and Affect: Mood normal.     Labs reviewed: Basic Metabolic Panel: Recent Labs    07/16/23 1612  NA 134*  K 3.1*  CL 94*  CO2 33*  GLUCOSE 182*  BUN 21  CREATININE 0.94  CALCIUM 9.6   Liver Function Tests: No results for input(s): "AST", "ALT", "ALKPHOS", "BILITOT", "PROT", "ALBUMIN" in the last 8760 hours. No results for input(s): "LIPASE", "AMYLASE" in the last 8760 hours. No results for input(s): "AMMONIA" in the last 8760 hours. CBC: Recent Labs    07/16/23 1612  WBC 13.1*  NEUTROABS 8.8*  HGB 12.1  HCT 37.4  MCV 88.0  PLT 371   Lipid Panel: No results for input(s): "CHOL", "HDL", "LDLCALC", "TRIG", "CHOLHDL", "LDLDIRECT" in the last 8760 hours. TSH: No results for input(s): "TSH" in the last 8760 hours. A1C: Lab Results  Component Value Date  HGBA1C 7.9 (H) 07/06/2019     Assessment/Plan Assessment and Plan    COPD Recent exacerbation but at baseline at this time. Currently on Trelegy daily, Albuterol nebulizer and inhaler as needed. Last seen by pulmonologist Dr. Sherene Sires, but it has been a while. -Schedule appointment with pulmonary for follow-up and management of COPD. -Continue  current medications.  Hypertension On multiple medications (Amlodipine 10mg  daily, Bisoprolol 5mg  daily, Clonidine 0.1mg  twice daily, Hydrochlorothiazide 12.5mg  daily). Patient reports feeling unwell due to medications. -Continue current medications. -Consider diet modification, specifically low sodium diet.  Diabetes On Glipizide 10mg  twice daily, Metformin 500mg  twice daily, and Rybelsus 3mg  daily. No recent hypoglycemic episodes reported. Patient does not currently have a glucose meter. -Provide patient with glucose meter and instruct to check blood sugars at home. -Continue current medications.  Osteoarthritis of the knee Occasional use of Meloxicam 7.5mg  for pain. Patient also uses Tylenol Arthritis for pain relief. -Continue Meloxicam 7.5mg  as needed for pain, but use sparingly due to potential kidney effects. -Continue Tylenol as needed for pain, up to 1000mg  every 8 hours.  Dyspepsia On Famotidine 20mg  daily. Patient does not report any current symptoms of indigestion or acid reflux. -Continue Famotidine 20mg  daily.  Hyperlipidemia On Rosuvastatin. -Continue Rosuvastatin.  Neuropathy On Gabapentin 300mg  three times daily, but patient reports not noticing any benefit. -Decrease Gabapentin to 300mg  twice daily.  Congestive Heart Failure Patient reports occasional leg swelling. Has a pacemaker and is followed by cardiology. -Continue current management and follow-up with cardiology.  General Health Maintenance -Ensure up-to-date mammogram due to family history of breast cancer. -Obtain recent blood work results from previous provider. -Follow-up in 3 months for repeat blood work and further management.      Return in about 3 months (around 11/26/2023) for routine follow up.  Janene Harvey. [facility]agio [facility] [phone]

## 2023-08-29 NOTE — Addendum Note (Signed)
Addended by: Michaell Cowing on: 08/29/2023 01:54 PM   Modules accepted: Orders

## 2023-09-09 ENCOUNTER — Ambulatory Visit (INDEPENDENT_AMBULATORY_CARE_PROVIDER_SITE_OTHER): Payer: 59

## 2023-09-09 DIAGNOSIS — I442 Atrioventricular block, complete: Secondary | ICD-10-CM

## 2023-09-10 ENCOUNTER — Telehealth: Payer: Self-pay

## 2023-09-10 ENCOUNTER — Other Ambulatory Visit: Payer: Self-pay

## 2023-09-10 DIAGNOSIS — Z122 Encounter for screening for malignant neoplasm of respiratory organs: Secondary | ICD-10-CM

## 2023-09-10 DIAGNOSIS — Z87891 Personal history of nicotine dependence: Secondary | ICD-10-CM

## 2023-09-10 NOTE — Telephone Encounter (Signed)
 Called and reviewed results below with patient. She does not have a cardiologist or take a stain. Advises she stopped taking Rosuvastatin due to side effects. Patient will follow up with her PCP, Abbey Chatters, NP for incidental findings and discuss taking a statin and cardiology referral if warranted. Results and plan will be sent to PCP for follow.   Shanda Bumps, please let me [place] know if you need anything with patient's follow up. Results forwarded to your office.   IMPRESSION: 1. Lung-RADS 1, negative. Continue annual screening with low-dose chest CT without contrast in 12 months. 2. Aortic atherosclerosis (ICD10-I70.0). Left anterior descending coronary artery calcification. 3. Enlarged pulmonic trunk, indicative of pulmonary arterial hypertension. 4.  Emphysema (ICD10-J43.9).

## 2023-09-11 NOTE — Telephone Encounter (Signed)
 Do recommend follow up in office, I have added admin team to call and schedule her an appt.

## 2023-09-12 LAB — CUP PACEART REMOTE DEVICE CHECK
Battery Impedance: 2313 Ohm
Battery Remaining Longevity: 28 mo
Battery Voltage: 2.74 V
Brady Statistic AP VP Percent: 81 %
Brady Statistic AP VS Percent: 1 %
Brady Statistic AS VP Percent: 14 %
Brady Statistic AS VS Percent: 5 %
Date Time Interrogation Session: 20250307110740
Implantable Lead Connection Status: 753985
Implantable Lead Connection Status: 753985
Implantable Lead Implant Date: 20131218
Implantable Lead Implant Date: 20131218
Implantable Lead Location: 753859
Implantable Lead Location: 753860
Implantable Lead Model: 5076
Implantable Lead Model: 5076
Implantable Pulse Generator Implant Date: 20131218
Lead Channel Impedance Value: 508 Ohm
Lead Channel Impedance Value: 541 Ohm
Lead Channel Pacing Threshold Amplitude: 0.5 V
Lead Channel Pacing Threshold Amplitude: 0.875 V
Lead Channel Pacing Threshold Pulse Width: 0.4 ms
Lead Channel Pacing Threshold Pulse Width: 0.4 ms
Lead Channel Setting Pacing Amplitude: 2 V
Lead Channel Setting Pacing Amplitude: 2.5 V
Lead Channel Setting Pacing Pulse Width: 0.4 ms
Lead Channel Setting Sensing Sensitivity: 2 mV
Zone Setting Status: 755011
Zone Setting Status: 755011

## 2023-09-15 ENCOUNTER — Ambulatory Visit (INDEPENDENT_AMBULATORY_CARE_PROVIDER_SITE_OTHER): Admitting: Nurse Practitioner

## 2023-09-15 ENCOUNTER — Encounter: Payer: Self-pay | Admitting: Nurse Practitioner

## 2023-09-15 ENCOUNTER — Telehealth: Payer: Self-pay | Admitting: *Deleted

## 2023-09-15 VITALS — BP 130/84 | HR 60 | Temp 97.8°F | Resp 18 | Ht 65.0 in | Wt 171.6 lb

## 2023-09-15 DIAGNOSIS — E785 Hyperlipidemia, unspecified: Secondary | ICD-10-CM

## 2023-09-15 DIAGNOSIS — E1169 Type 2 diabetes mellitus with other specified complication: Secondary | ICD-10-CM | POA: Diagnosis not present

## 2023-09-15 DIAGNOSIS — J449 Chronic obstructive pulmonary disease, unspecified: Secondary | ICD-10-CM

## 2023-09-15 DIAGNOSIS — I2721 Secondary pulmonary arterial hypertension: Secondary | ICD-10-CM

## 2023-09-15 DIAGNOSIS — I700 Atherosclerosis of aorta: Secondary | ICD-10-CM | POA: Diagnosis not present

## 2023-09-15 DIAGNOSIS — Z794 Long term (current) use of insulin: Secondary | ICD-10-CM

## 2023-09-15 NOTE — Progress Notes (Unsigned)
 Careteam: Patient Care Team: [name], NP as PCP - General (Geriatric Medicine)  PLACE OF SERVICE:  [facility] CLINIC  Advanced Directive information Does Patient Have a Medical Advance Directive?: No, Would patient like information on creating a medical advance directive?: No - Patient declined  No Known Allergies  Chief Complaint  Patient presents with   Follow-up    Follow up and review ct chest results.   Discussed the use of AI scribe software for clinical note transcription with the patient, who gave verbal consent to proceed.  HPI: pt is a 72 y.o. female ***   History of Present Illness   The patient, with COPD, presents for a review of her recent chest CT scan findings.  She has a history of COPD, diagnosed based on pulmonary testing, and experiences progressive shortness of breath. She uses an inhaler as needed and has been advised to take Trelegy daily. Her oxygen level was noted to be 92% during the visit. She has not seen her pulmonologist, Dr. Sharee Pimple, in a while and needs to follow up for further evaluation.  She is currently on atorvastatin 20 mg for cholesterol management. A recent cholesterol panel has not been done.  She has diabetes but does not regularly check her blood sugars at home. There is no mention of an endocrinologist managing her diabetes.  She is a former smoker who quit ten years ago. She resides in [place] and visits the pulmonologist's office on [address].        Review of Systems:  Review of Systems  Constitutional:  Negative for chills, fever and weight loss.  HENT:  Negative for tinnitus.   Respiratory:  Positive for shortness of breath. Negative for cough and sputum production.   Cardiovascular:  Negative for chest pain, palpitations and leg swelling.  Gastrointestinal:  Negative for abdominal pain, constipation, diarrhea and heartburn.  Genitourinary:  Negative for dysuria, frequency and urgency.  Musculoskeletal:  Negative  for back pain, falls, joint pain and myalgias.  Skin: Negative.   Neurological:  Negative for dizziness and headaches.  Psychiatric/Behavioral:  Negative for depression and memory loss. The patient does not have insomnia.   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   COPD (chronic obstructive pulmonary disease) (HCC)    Diabetes (HCC)    HTN (hypertension)    poorly controlled   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   Past Surgical History:  Procedure Laterality Date   APPENDECTOMY     LEFT HEART CATHETERIZATION WITH CORONARY ANGIOGRAM N/A 04/30/2013   Procedure: LEFT HEART CATHETERIZATION WITH CORONARY ANGIOGRAM;  Surgeon: Peter M Swaziland, MD;  Location: [facility] CATH LAB;  Service: Cardiovascular;  Laterality: N/A;   PERMANENT PACEMAKER INSERTION N/A 06/24/2012   Procedure: PERMANENT PACEMAKER INSERTION;  Surgeon: Duke Salvia, MD;  Location: [facility] CATH LAB;  Service: Cardiovascular;  Laterality: N/A;   TONSILLECTOMY  1965   TUBAL LIGATION     Social History:   reports that she quit smoking about 10 years ago. Her smoking use included cigarettes. She started smoking about 56 years ago. She has a 46 pack-year smoking history. She has never used smokeless tobacco. She reports that she does not drink alcohol and does not use drugs.  Family History  Problem Relation Age of Onset   Ovarian cancer Mother  Heart attack Mother        Died age 18   Pneumonia Father    Breast cancer Sister    Emphysema Brother    Heart attack Brother        3 MI's - 1st age 63    Medications: Patient's Medications  New Prescriptions   SEMAGLUTIDE (RYBELSUS) 7 MG TABS    Take 1 tablet (7 mg total) by mouth daily.  Previous Medications   ALBUTEROL (PROVENTIL) (2.5 MG/3ML) 0.083% NEBULIZER SOLUTION    Take 3 mLs (2.5 mg total) by nebulization every 6 (six) hours as needed  for wheezing or shortness of breath.   ALBUTEROL (VENTOLIN HFA) 108 (90 BASE) MCG/ACT INHALER    Inhale 2 puffs into the lungs every 6 (six) hours as needed for wheezing or shortness of breath.   AMLODIPINE (NORVASC) 10 MG TABLET    Take 1 tablet (10 mg total) by mouth daily.   APOAEQUORIN (PREVAGEN PO)    Take 10 mg by mouth.   BISOPROLOL (ZEBETA) 5 MG TABLET    TAKE 1 TABLET (5 MG TOTAL) BY MOUTH DAILY.   BLOOD GLUCOSE MONITORING SUPPL DEVI    1 each by Does not apply route daily. May substitute to any manufacturer covered by patient's insurance.   CLONIDINE (CATAPRES) 0.1 MG TABLET    Take 1 tablet (0.1 mg total) by mouth 2 (two) times daily.   FAMOTIDINE (PEPCID) 20 MG TABLET    TAKE ONE TABLET BY MOUTH DAILY AT 5PM AFTER SUPPER   GABAPENTIN (NEURONTIN) 300 MG CAPSULE    Take 1 capsule (300 mg total) by mouth 2 (two) times daily.   GLIPIZIDE (GLUCOTROL) 10 MG TABLET    Take 10 mg by mouth 2 (two) times daily.   GLUCOSE BLOOD (BLOOD GLUCOSE TEST STRIPS) STRP    1 each by In Vitro route daily. May substitute to any manufacturer covered by patient's insurance.   HYDROCHLOROTHIAZIDE (MICROZIDE) 12.5 MG CAPSULE    Take 12.5 mg by mouth daily.   LANCET DEVICE MISC    1 each by Does not apply route daily. May substitute to any manufacturer covered by patient's insurance.   LANCETS MISC. MISC    1 each by Does not apply route daily. May substitute to any manufacturer covered by patient's insurance.   MELOXICAM (MOBIC) 7.5 MG TABLET    Take 1 tablet (7.5 mg total) by mouth daily.   METFORMIN (GLUCOPHAGE) 500 MG TABLET    Take 500 mg by mouth 2 (two) times daily.   ROSUVASTATIN (CRESTOR) 20 MG TABLET    Take 20 mg by mouth every morning.   TRELEGY ELLIPTA 100-62.5-25 MCG/ACT AEPB    TAKE 1 PUFF BY MOUTH EVERY DAY  Modified Medications   No medications on file  Discontinued Medications   RYBELSUS 3 MG TABS    Take 1 tablet by mouth every morning.    Physical Exam:  Vitals:   09/15/23 1010  BP:  130/84  Pulse: 60  Resp: 18  Temp: 97.8 F (36.6 C)  SpO2: 92%  Weight: 171 lb 9.6 oz (77.8 kg)  Height: 5\' 5"  (1.651 m)   Body mass index is 28.56 kg/m. Wt Readings from Last 3 Encounters:  09/15/23 171 lb 9.6 oz (77.8 kg)  08/29/23 171 lb 12.8 oz (77.9 kg)  01/23/23 188 lb 12.8 oz (85.6 kg)    Physical Exam Constitutional:      General: She is not in acute distress.  Appearance: She is well-developed. She is not diaphoretic.  HENT:     Head: Normocephalic and atraumatic.     Mouth/Throat:     Pharynx: No oropharyngeal exudate.  Eyes:     Conjunctiva/sclera: Conjunctivae normal.     Pupils: Pupils are equal, round, and reactive to light.  Cardiovascular:     Rate and Rhythm: Normal rate and regular rhythm.     Heart sounds: Normal heart sounds.  Pulmonary:     Effort: Pulmonary effort is normal.     Breath sounds: Normal breath sounds.  Abdominal:     General: Bowel sounds are normal.     Palpations: Abdomen is soft.  Musculoskeletal:     Cervical back: Normal range of motion and neck supple.     Right lower leg: No edema.     Left lower leg: No edema.  Skin:    General: Skin is warm and dry.  Neurological:     Mental Status: She is alert.  Psychiatric:        Mood and Affect: Mood normal.   ***  Labs reviewed: Basic Metabolic Panel: Recent Labs    07/16/23 1612 09/15/23 1040  NA 134* 142  K 3.1* 3.9  CL 94* 105  CO2 33* 28  GLUCOSE 182* 119*  BUN 21 26*  CREATININE 0.94 1.10*  CALCIUM 9.6 10.3   Liver Function Tests: Recent Labs    09/15/23 1040  AST 12  ALT 7  BILITOT 0.3  PROT 7.0   No results for input(s): "LIPASE", "AMYLASE" in the last 8760 hours. No results for input(s): "AMMONIA" in the last 8760 hours. CBC: Recent Labs    07/16/23 1612 09/15/23 1040  WBC 13.1* 6.8  NEUTROABS 8.8* 3,380  HGB 12.1 13.6  HCT 37.4 41.5  MCV 88.0 88.7  PLT 371 236   Lipid Panel: Recent Labs    09/15/23 1040  CHOL 136  HDL 68  LDLCALC  48  TRIG 010  CHOLHDL 2.0   TSH: No results for input(s): "TSH" in the last 8760 hours. A1C: Lab Results  Component Value Date   HGBA1C 7.9 (H) 09/15/2023     Assessment/Plan Assessment and Plan    Pulmonary Arterial Hypertension CT scan shows enlarged pulmonic trunk, suggestive of pulmonary arterial hypertension. - Refer to pulmonologist for follow-up. - Call pulmonologist to schedule appointment.  Chronic Obstructive Pulmonary Disease (COPD) COPD confirmed by pulmonary testing. Progressive dyspnea noted. Trelegy recommended for daily use. - Ensure daily use of Trelegy. - Refer to pulmonologist for follow-up. - Call pulmonologist to schedule appointment.  Aortic Atherosclerosis and Coronary Artery Calcification CT scan reveals aortic atherosclerosis and coronary artery calcification, indicating increased cardiovascular risk. Aspirin therapy recommended. - Initiate aspirin 81 mg daily, enteric coated.  Hyperlipidemia On atorvastatin 20 mg. Recent cholesterol panel needed to evaluate treatment efficacy. - Order cholesterol panel.  Diabetes Mellitus Diabetes mellitus without endocrinologist follow-up or home glucose monitoring. Monitoring control is crucial. - Order A1c test.       *** Aortic atherosclerosis (HCC) -     Lipid panel -     CBC with Differential/Platelet -     Complete Metabolic Panel with eGFR  COPD GOLD III -     Ambulatory referral to Pulmonology  Pulmonary arterial hypertension (HCC) -     Ambulatory referral to Pulmonology  Type 2 diabetes mellitus with other specified complication, with long-term current use of insulin (HCC) -     Hemoglobin A1c  No follow-ups on file.: ***  Alasdair Kleve K. Biagio Borg [facility] [phone]

## 2023-09-15 NOTE — Telephone Encounter (Signed)
 PT called saying her pcp told her to call to make appt to rev CT. I made appt against my better judgment because usually we reach out by mail, right? If need be cancel appt and contact pt. Thanks.

## 2023-09-15 NOTE — Patient Instructions (Addendum)
 Call today to make follow up with Pulmonary- follow up on COPD and CT chest  [facility] at [facility]  Address: [address], [place], [place] [address] Hours:  Open ? Closes 5?PM Confirmed by this business 8 weeks ago Phone: [phone]   START ASPIRIN EC 81 mg daily - take whole daily

## 2023-09-16 ENCOUNTER — Encounter: Payer: Self-pay | Admitting: Nurse Practitioner

## 2023-09-16 LAB — CBC WITH DIFFERENTIAL/PLATELET
Absolute Lymphocytes: 2536 {cells}/uL (ref 850–3900)
Absolute Monocytes: 592 {cells}/uL (ref 200–950)
Basophils Absolute: 61 {cells}/uL (ref 0–200)
Basophils Relative: 0.9 %
Eosinophils Absolute: 231 {cells}/uL (ref 15–500)
Eosinophils Relative: 3.4 %
HCT: 41.5 % (ref 35.0–45.0)
Hemoglobin: 13.6 g/dL (ref 11.7–15.5)
MCH: 29.1 pg (ref 27.0–33.0)
MCHC: 32.8 g/dL (ref 32.0–36.0)
MCV: 88.7 fL (ref 80.0–100.0)
MPV: 10.3 fL (ref 7.5–12.5)
Monocytes Relative: 8.7 %
Neutro Abs: 3380 {cells}/uL (ref 1500–7800)
Neutrophils Relative %: 49.7 %
Platelets: 236 10*3/uL (ref 140–400)
RBC: 4.68 10*6/uL (ref 3.80–5.10)
RDW: 14.2 % (ref 11.0–15.0)
Total Lymphocyte: 37.3 %
WBC: 6.8 10*3/uL (ref 3.8–10.8)

## 2023-09-16 LAB — LIPID PANEL
Cholesterol: 136 mg/dL (ref <200)
HDL: 68 mg/dL (ref >=50)
LDL Cholesterol (Calc): 48 mg/dL
Non-HDL Cholesterol (Calc): 68 mg/dL (ref <130)
Total CHOL/HDL Ratio: 2 (calc) (ref <5.0)
Triglycerides: 123 mg/dL (ref <150)

## 2023-09-16 LAB — COMPLETE METABOLIC PANEL WITH GFR
AG Ratio: 2.3 (calc) (ref 1.0–2.5)
ALT: 7 U/L (ref 6–29)
AST: 12 U/L (ref 10–35)
Albumin: 4.9 g/dL (ref 3.6–5.1)
Alkaline phosphatase (APISO): 80 U/L (ref 37–153)
BUN/Creatinine Ratio: 24 (calc) — ABNORMAL HIGH (ref 6–22)
BUN: 26 mg/dL — ABNORMAL HIGH (ref 7–25)
CO2: 28 mmol/L (ref 20–32)
Calcium: 10.3 mg/dL (ref 8.6–10.4)
Chloride: 105 mmol/L (ref 98–110)
Creat: 1.1 mg/dL — ABNORMAL HIGH (ref 0.60–1.00)
Globulin: 2.1 g/dL (ref 1.9–3.7)
Glucose, Bld: 119 mg/dL — ABNORMAL HIGH (ref 65–99)
Potassium: 3.9 mmol/L (ref 3.5–5.3)
Sodium: 142 mmol/L (ref 135–146)
Total Bilirubin: 0.3 mg/dL (ref 0.2–1.2)
Total Protein: 7 g/dL (ref 6.1–8.1)
eGFR: 53 mL/min/{1.73_m2} — ABNORMAL LOW (ref >=60)

## 2023-09-16 LAB — HEMOGLOBIN A1C
Hgb A1c MFr Bld: 7.9 %{Hb} — ABNORMAL HIGH (ref <5.7)
Mean Plasma Glucose: 180 mg/dL
eAG (mmol/L): 10 mmol/L

## 2023-09-16 NOTE — Telephone Encounter (Signed)
 Called and spoke with patient. Results reviewed. Patient had no additional questions. She is on a statin therapy. She will complete annual scan again next year. In office appt cancelled.

## 2023-09-17 ENCOUNTER — Other Ambulatory Visit: Payer: Self-pay | Admitting: Nurse Practitioner

## 2023-09-17 DIAGNOSIS — Z794 Long term (current) use of insulin: Secondary | ICD-10-CM

## 2023-09-17 MED ORDER — RYBELSUS 7 MG PO TABS: 7.0000 mg | ORAL_TABLET | Freq: Every day | ORAL | 2 refills | Status: DC

## 2023-10-02 NOTE — Telephone Encounter (Signed)
 NFN

## 2023-10-07 ENCOUNTER — Ambulatory Visit: Admitting: Acute Care

## 2023-10-08 ENCOUNTER — Ambulatory Visit: Payer: Self-pay

## 2023-10-08 NOTE — Telephone Encounter (Signed)
 10/08/2023 1400- 3rd attempt to contact patient, no answer, mailbox full. Will route to clinic for f/u   2nd attempt made. Unable to leave voicemail due to mailbox being full.   Copied From CRM [id number]. Reason for Triage:   Patient has been experiencing diarrhea and would like to be advised on what is considered excessive.   Symptoms started last week and then it stopped and then it came back this week. Symptoms typically last one day.   Symptoms:   Loss of appetite (loss appetite several days ago)  Stomach pressure  Diarrehea   No history of GI concerns   Callback Number: [phone]

## 2023-10-08 NOTE — Telephone Encounter (Signed)
 Mychart message sent to patient.

## 2023-10-08 NOTE — Telephone Encounter (Signed)
 Copied From CRM [id number]. Reason for Triage:   Patient has been experiencing diarrhea and would like to be advised on what is considered excessive.   Symptoms started last week and then it stopped and then it came back this week. Symptoms typically last one day.   Symptoms:   Loss of appetite (loss appetite several days ago)  Stomach pressure  Diarrehea   No history of GI concerns   Callback Number: [phone]

## 2023-10-08 NOTE — Telephone Encounter (Signed)
 2nd attempt made. Unable to leave voicemail due to mailbox being full.   Copied From CRM [id number]. Reason for Triage:   Patient has been experiencing diarrhea and would like to be advised on what is considered excessive.   Symptoms started last week and then it stopped and then it came back this week. Symptoms typically last one day.   Symptoms:   Loss of appetite (loss appetite several days ago)  Stomach pressure  Diarrehea   No history of GI concerns   Callback Number: [phone]

## 2023-10-14 NOTE — Progress Notes (Signed)
 Remote pacemaker transmission.

## 2023-10-14 NOTE — Addendum Note (Signed)
 Addended by: Geralyn Flash D on: 10/14/2023 10:05 AM   Modules accepted: Orders

## 2023-10-20 ENCOUNTER — Encounter: Payer: Self-pay | Admitting: Internal Medicine

## 2023-11-05 ENCOUNTER — Ambulatory Visit: Payer: Self-pay

## 2023-11-05 NOTE — Telephone Encounter (Addendum)
 Copied From CRM [id number]. Reason for Triage: The patient states since she started on SEMAGLUTIDE  (RYBELSUS ) 7 MG TABS on 03/12 she has felt nauseous after taking it each time. She would like to ask her PCP if she can stop Rybelsus  due to the side effect. Callback #:[phone]   11/05/23 11:27- 1st attempt, no answer, vm box is full.  11/05/23 12:45-2nd attempt, no answer, VM is full, unable to leave message 11/05/23 1:46- 3rd attempt, no answer, VM is full. Will route to clinic for f/u

## 2023-11-05 NOTE — Telephone Encounter (Signed)
 Copied from CRM [id number]. Topic: General - Other >> Nov 05, 2023  3:40 PM Shamecia H wrote: Reason for CRM: Patient callback because she stated she had not heard anything via the note the patient was called 3 times no answer, patients callback number is [phone]

## 2023-11-05 NOTE — Telephone Encounter (Signed)
 She may be eating too much, recommend to decrease portion size and make a follow up appt in office to discuss symptoms/side effects and other option to manage her diabetes if this not effective.

## 2023-11-05 NOTE — Telephone Encounter (Signed)
 Message routed to PCP Roselie Conger, Champ Coma, NP . Can she stop medication?

## 2023-11-05 NOTE — Telephone Encounter (Signed)
 Still awaiting reply from PCP Roselie Conger Jessica K, NP

## 2023-11-06 NOTE — Telephone Encounter (Signed)
 Please have her check fasting blood sugar since she will be stopping medication

## 2023-11-06 NOTE — Telephone Encounter (Signed)
 Thank you :)

## 2023-11-06 NOTE — Telephone Encounter (Addendum)
 Patient called and notified. Patient states that she checked her blood sugars a couple of days ago and levels were 166. She wants to know what would be recommended as too high of a blood sugar level for fasting?

## 2023-11-06 NOTE — Telephone Encounter (Signed)
 Patient scheduled for 11/10/2023. Message routed to PCP Roselie Conger, Champ Coma, NP

## 2023-11-06 NOTE — Telephone Encounter (Signed)
 Please have her make a follow up appt with me so we can discuss, can be virutal

## 2023-11-06 NOTE — Telephone Encounter (Signed)
 Patient states that she hasn't been over eating because she cannot. She states that she only has 2 bites and she's nauseas. I offered patient to come in and be seen today, she denied appointment with another PCP. She just wanted to inform you that she will not be taking this medication anymore and she will wait until next scheduled appointment 11/21/2023 to further discuss. Message routed back to PCP Roselie Conger, Champ Coma, NP

## 2023-11-10 ENCOUNTER — Encounter: Payer: Self-pay | Admitting: Nurse Practitioner

## 2023-11-10 ENCOUNTER — Telehealth: Admitting: Nurse Practitioner

## 2023-11-10 DIAGNOSIS — Z7985 Long-term (current) use of injectable non-insulin antidiabetic drugs: Secondary | ICD-10-CM | POA: Diagnosis not present

## 2023-11-10 DIAGNOSIS — E1169 Type 2 diabetes mellitus with other specified complication: Secondary | ICD-10-CM

## 2023-11-10 DIAGNOSIS — Z7984 Long term (current) use of oral hypoglycemic drugs: Secondary | ICD-10-CM | POA: Diagnosis not present

## 2023-11-10 DIAGNOSIS — Z794 Long term (current) use of insulin: Secondary | ICD-10-CM

## 2023-11-10 MED ORDER — SITAGLIPTIN PHOSPHATE 100 MG PO TABS: 100.0000 mg | ORAL_TABLET | Freq: Every day | ORAL | 0 refills | Status: DC

## 2023-11-10 NOTE — Progress Notes (Signed)
 Careteam: Patient Care Team: Erika Gobble, NP as PCP - General (Geriatric Medicine)  Advanced Directive information Does Patient Have a Medical Advance Directive?: No, Would patient like information on creating a medical advance directive?: No - Patient declined  No Known Allergies  Chief Complaint  Patient presents with   Results    Discuss fasting blood sugars off medication.     Discussed the use of AI scribe software for clinical note transcription with the patient, who gave verbal consent to proceed.  History of Present Illness Erika Leach is a 73 year old female with diabetes who presents with medication intolerance and elevated blood sugars.  She started taking Rybelsus  for her diabetes but experienced significant nausea, described as 'all day nauseous' similar to morning sickness, which led her to stop the medication last Thursday. Since discontinuing Rybelsus , she has been able to eat more, which she believes has contributed to an increase in her blood sugar levels.  Her current diabetes management includes glipizide  10 mg twice a day and metformin 500 mg twice a day. Her blood sugar levels have increased since stopping Rybelsus , with a recent fasting reading of 199 mg/dL. Previously, her blood sugar was around 160 mg/dL.  She has experienced significant weight loss, dropping from the 170s to 141 pounds over a few months, which she associates with the use of Rybelsus  and its impact on her appetite. The medication made it difficult to eat more than one meal a day, as it 'completely took my appetite' and 'nothing tastes good'.  She is unsure if she has experienced low blood sugars but mentions feeling 'weird' at times, which may have been related to hypoglycemia.    Review of Systems:  Review of Systems  Constitutional:  Negative for chills, fever and weight loss.  HENT:  Negative for tinnitus.   Respiratory:  Negative for cough, sputum production and  shortness of breath.   Cardiovascular:  Negative for chest pain, palpitations and leg swelling.  Gastrointestinal:  Positive for nausea (improved at this time). Negative for abdominal pain, constipation, diarrhea and heartburn.  Genitourinary:  Negative for dysuria, frequency and urgency.  Musculoskeletal:  Negative for back pain, falls, joint pain and myalgias.  Skin: Negative.   Neurological:  Negative for dizziness and headaches.  Psychiatric/Behavioral:  Negative for depression and memory loss. The patient does not have insomnia.     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   COPD (chronic obstructive pulmonary disease) (HCC)    Diabetes (HCC)    HTN (hypertension)    poorly controlled   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   Past Surgical History:  Procedure Laterality Date   APPENDECTOMY     LEFT HEART CATHETERIZATION WITH CORONARY ANGIOGRAM N/A 04/30/2013   Procedure: LEFT HEART CATHETERIZATION WITH CORONARY ANGIOGRAM;  Surgeon: Peter M Swaziland, MD;  Location: [facility] CATH LAB;  Service: Cardiovascular;  Laterality: N/A;   PERMANENT PACEMAKER INSERTION N/A 06/24/2012   Procedure: PERMANENT PACEMAKER INSERTION;  Surgeon: Verona Goodwill, MD;  Location: [facility] CATH LAB;  Service: Cardiovascular;  Laterality: N/A;   TONSILLECTOMY  1965   TUBAL LIGATION     Social History:   reports that she quit smoking about 10 years ago. Her smoking use included cigarettes. She started smoking about 56 years ago. She has a 46 pack-year smoking history.  She has never used smokeless tobacco. She reports that she does not drink alcohol and does not use drugs.  Family History  Problem Relation Age of Onset   Ovarian cancer Mother    Heart attack Mother        Died age 75   Pneumonia Father    Breast cancer Sister    Emphysema Brother    Heart attack Brother        3 MI's  - 1st age 30    Medications: Patient's Medications  New Prescriptions   SITAGLIPTIN (JANUVIA) 100 MG TABLET    Take 1 tablet (100 mg total) by mouth daily.  Previous Medications   ALBUTEROL  (PROVENTIL ) (2.5 MG/3ML) 0.083% NEBULIZER SOLUTION    Take 3 mLs (2.5 mg total) by nebulization every 6 (six) hours as needed for wheezing or shortness of breath.   ALBUTEROL  (VENTOLIN  HFA) 108 (90 BASE) MCG/ACT INHALER    Inhale 2 puffs into the lungs every 6 (six) hours as needed for wheezing or shortness of breath.   AMLODIPINE  (NORVASC ) 10 MG TABLET    Take 1 tablet (10 mg total) by mouth daily.   APOAEQUORIN (PREVAGEN PO)    Take 10 mg by mouth.   BISOPROLOL  (ZEBETA ) 5 MG TABLET    TAKE 1 TABLET (5 MG TOTAL) BY MOUTH DAILY.   BLOOD GLUCOSE MONITORING SUPPL DEVI    1 each by Does not apply route daily. May substitute to any manufacturer covered by patient's insurance.   CLONIDINE  (CATAPRES ) 0.1 MG TABLET    Take 1 tablet (0.1 mg total) by mouth 2 (two) times daily.   FAMOTIDINE  (PEPCID ) 20 MG TABLET    TAKE ONE TABLET BY MOUTH DAILY AT 5PM AFTER SUPPER   GABAPENTIN  (NEURONTIN ) 300 MG CAPSULE    Take 1 capsule (300 mg total) by mouth 2 (two) times daily.   GLIPIZIDE  (GLUCOTROL ) 10 MG TABLET    Take 10 mg by mouth 2 (two) times daily.   HYDROCHLOROTHIAZIDE  (MICROZIDE ) 12.5 MG CAPSULE    Take 12.5 mg by mouth daily.   LANCET DEVICE MISC    1 each by Does not apply route daily. May substitute to any manufacturer covered by patient's insurance.   LANCETS MISC. MISC    1 each by Does not apply route daily. May substitute to any manufacturer covered by patient's insurance.   MELOXICAM  (MOBIC ) 7.5 MG TABLET    Take 1 tablet (7.5 mg total) by mouth daily.   METFORMIN (GLUCOPHAGE) 500 MG TABLET    Take 500 mg by mouth 2 (two) times daily.   ROSUVASTATIN (CRESTOR) 20 MG TABLET    Take 20 mg by mouth every morning.   TRELEGY ELLIPTA  100-62.5-25 MCG/ACT AEPB    TAKE 1 PUFF BY MOUTH EVERY DAY  Modified Medications    No medications on file  Discontinued Medications   SEMAGLUTIDE  (RYBELSUS ) 7 MG TABS    Take 1 tablet (7 mg total) by mouth daily.    Physical Exam:  There were no vitals filed for this visit. There is no height or weight on file to calculate BMI. Wt Readings from Last 3 Encounters:  09/15/23 171 lb 9.6 oz (77.8 kg)  08/29/23 171 lb 12.8 oz (77.9 kg)  01/23/23 188 lb 12.8 oz (85.6 kg)    Physical Exam Constitutional:      Appearance: Normal appearance.  Pulmonary:     Effort: Pulmonary effort is normal.  Neurological:     Mental Status: She is alert.  Mental status is at baseline.  Psychiatric:        Mood and Affect: Mood normal.     Labs reviewed: Basic Metabolic Panel: Recent Labs    07/16/23 1612 09/15/23 1040  NA 134* 142  K 3.1* 3.9  CL 94* 105  CO2 33* 28  GLUCOSE 182* 119*  BUN 21 26*  CREATININE 0.94 1.10*  CALCIUM 9.6 10.3   Liver Function Tests: Recent Labs    09/15/23 1040  AST 12  ALT 7  BILITOT 0.3  PROT 7.0   No results for input(s): "LIPASE", "AMYLASE" in the last 8760 hours. No results for input(s): "AMMONIA" in the last 8760 hours. CBC: Recent Labs    07/16/23 1612 09/15/23 1040  WBC 13.1* 6.8  NEUTROABS 8.8* 3,380  HGB 12.1 13.6  HCT 37.4 41.5  MCV 88.0 88.7  PLT 371 236   Lipid Panel: Recent Labs    09/15/23 1040  CHOL 136  HDL 68  LDLCALC 48  TRIG 123  CHOLHDL 2.0   TSH: No results for input(s): "TSH" in the last 8760 hours. A1C: Lab Results  Component Value Date   HGBA1C 7.9 (H) 09/15/2023     Assessment/Plan Assessment and Plan Assessment & Plan Type 2 diabetes mellitus Type 2 diabetes with increased glucose levels post-Rybelsus  cessation. Current regimen: glipizide  and metformin. Weight loss attributed to Rybelsus . Plan to initiate Januvia for better glycemic control. - Start Januvia 100 mg daily in the morning. - Continue metformin 500 mg twice daily. - Continue glipizide  10 mg twice daily. Stop  rybelsus   - Advise regular blood glucose monitoring and check levels if hypoglycemia symptoms occur. - Continue dietary modifications. - keep follow up in office in a few weeks to assess response.   Murdock Jellison K. Denney Fisherman  [facility] [phone]    Virtual Visit via video  I connected with patient on 11/10/23 at  3:00 PM EDT by mychart and verified that I am speaking with the correct person using two identifiers.  Location: Patient: home Provider: [facility]   I discussed the limitations, risks, security and privacy concerns of performing an evaluation and management service by telephone and the availability of in person appointments. I also discussed with the patient that there may be a patient responsible charge related to this service. The patient expressed understanding and agreed to proceed.   I discussed the assessment and treatment plan with the patient. The patient was provided an opportunity to ask questions and all were answered. The patient agreed with the plan and demonstrated an understanding of the instructions.   The patient was advised to call back or seek an in-person evaluation if the symptoms worsen or if the condition fails to improve as anticipated.  I provided 15 minutes of non-face-to-face time during this encounter.  Talicia Sui K. Denney Fisherman Avs printed and mailed

## 2023-11-10 NOTE — Progress Notes (Signed)
   This service is provided via telemedicine  No vital signs collected/recorded due to the encounter was a telemedicine visit.   Location of patient (ex: home, work):  Home  Patient consents to a telephone visit: Yes  Location of the provider (ex: office, home):  [facility], Office   Name of any referring provider:  N/A  Names of all persons participating in the telemedicine service and their role in the encounter:  Ronald Pippins, CMA, Patient, and   Time spent on call:  9 min with medical assistant  Janyth Contes Janene Harvey, NP

## 2023-11-21 ENCOUNTER — Ambulatory Visit: Admitting: Nurse Practitioner

## 2023-11-21 ENCOUNTER — Encounter: Payer: Self-pay | Admitting: Nurse Practitioner

## 2023-11-21 VITALS — BP 126/74 | HR 60 | Temp 97.9°F | Ht 65.0 in | Wt 170.0 lb

## 2023-11-21 DIAGNOSIS — Z1211 Encounter for screening for malignant neoplasm of colon: Secondary | ICD-10-CM

## 2023-11-21 DIAGNOSIS — J449 Chronic obstructive pulmonary disease, unspecified: Secondary | ICD-10-CM

## 2023-11-21 DIAGNOSIS — I10 Essential (primary) hypertension: Secondary | ICD-10-CM | POA: Diagnosis not present

## 2023-11-21 DIAGNOSIS — Z87891 Personal history of nicotine dependence: Secondary | ICD-10-CM

## 2023-11-21 DIAGNOSIS — Z794 Long term (current) use of insulin: Secondary | ICD-10-CM

## 2023-11-21 DIAGNOSIS — Z1212 Encounter for screening for malignant neoplasm of rectum: Secondary | ICD-10-CM

## 2023-11-21 DIAGNOSIS — E785 Hyperlipidemia, unspecified: Secondary | ICD-10-CM

## 2023-11-21 DIAGNOSIS — I700 Atherosclerosis of aorta: Secondary | ICD-10-CM

## 2023-11-21 DIAGNOSIS — J9611 Chronic respiratory failure with hypoxia: Secondary | ICD-10-CM

## 2023-11-21 DIAGNOSIS — G4733 Obstructive sleep apnea (adult) (pediatric): Secondary | ICD-10-CM

## 2023-11-21 DIAGNOSIS — E1169 Type 2 diabetes mellitus with other specified complication: Secondary | ICD-10-CM

## 2023-11-21 NOTE — Progress Notes (Signed)
 Careteam: Patient Care Team: [name], NP as PCP - General (Geriatric Medicine) Silverio Drought, MD as Consulting Physician (Ophthalmology)  PLACE OF SERVICE:  [facility]  Advanced Directive information    No Known Allergies  Chief Complaint  Patient presents with   Medical Management of Chronic Issues    3 month follow-up and foot exam.  Discussed need for colonoscopy, AWV(scheduled for next month), eye exam (request sent to [facility]), diabetic kidney evaluation, hep c screen (declined), td/tdap, and lung cancer screening.     HPI:  Discussed the use of AI scribe software for clinical note transcription with the patient, who gave verbal consent to proceed.  History of Present Illness Erika Leach is a 73 year old female with type 2 diabetes and COPD who presents for a three-month follow-up.  She discontinued Rybelsus  due to gastrointestinal discomfort and initiated Januvia , which has improved her symptoms. She has not monitored her blood glucose levels recently but recalls them being within normal range during her last check. She possesses a glucose meter and is familiar with its use.  She has a history of obstructive sleep apnea but is not utilizing her CPAP machine due to discomfort. She believes her recent weight loss may have alleviated her symptoms.  Her COPD is managed with daily Trelegy. She experiences occasional cough and congestion in humid conditions but not consistently. She uses albuterol  approximately once a week.  Her hypertension is well-controlled with amlodipine  10 mg, Zetia 5 mg, clonidine  0.1 mg twice daily, and hydrochlorothiazide  12.5 mg. She is not currently using meloxicam  and is unsure of its initial purpose.  She is a former smoker and lives alone with her dog. She has an oxygen setup at home, which she uses infrequently when experiencing dyspnea. She carries her inhaler at all times.    Review of Systems:  Review  of Systems  Constitutional:  Negative for chills, fever and weight loss.  HENT:  Negative for tinnitus.   Respiratory:  Negative for cough, sputum production and shortness of breath.   Cardiovascular:  Negative for chest pain, palpitations and leg swelling.  Gastrointestinal:  Negative for abdominal pain, constipation, diarrhea and heartburn.  Genitourinary:  Negative for dysuria, frequency and urgency.  Musculoskeletal:  Negative for back pain, falls, joint pain and myalgias.  Skin: Negative.   Neurological:  Negative for dizziness and headaches.  Psychiatric/Behavioral:  Negative for depression and memory loss. The patient does not have insomnia.     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   COPD (chronic obstructive pulmonary disease) (HCC)    Diabetes (HCC)    HTN (hypertension)    poorly controlled   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   Past Surgical History:  Procedure Laterality Date   APPENDECTOMY     LEFT HEART CATHETERIZATION WITH CORONARY ANGIOGRAM N/A 04/30/2013   Procedure: LEFT HEART CATHETERIZATION WITH CORONARY ANGIOGRAM;  Surgeon: Peter M Swaziland, MD;  Location: [facility] CATH LAB;  Service: Cardiovascular;  Laterality: N/A;   PERMANENT PACEMAKER INSERTION N/A 06/24/2012   Procedure: PERMANENT PACEMAKER INSERTION;  Surgeon: Verona Goodwill, MD;  Location: [facility] CATH LAB;  Service: Cardiovascular;  Laterality: N/A;   TONSILLECTOMY  1965   TUBAL LIGATION     Social History:   reports that she quit smoking about 10 years ago.  Her smoking use included cigarettes. She started smoking about 56 years ago. She has a 46 pack-year smoking history. She has never used smokeless tobacco. She reports that she does not drink alcohol and does not use drugs.  Family History  Problem Relation Age of Onset   Ovarian cancer Mother    Heart attack Mother         Died age 24   Pneumonia Father    Breast cancer Sister    Emphysema Brother    Heart attack Brother        3 MI's - 1st age 3    Medications: Patient's Medications  New Prescriptions   No medications on file  Previous Medications   ALBUTEROL  (PROVENTIL ) (2.5 MG/3ML) 0.083% NEBULIZER SOLUTION    Take 3 mLs (2.5 mg total) by nebulization every 6 (six) hours as needed for wheezing or shortness of breath.   ALBUTEROL  (VENTOLIN  HFA) 108 (90 BASE) MCG/ACT INHALER    Inhale 2 puffs into the lungs every 6 (six) hours as needed for wheezing or shortness of breath.   AMLODIPINE  (NORVASC ) 10 MG TABLET    Take 1 tablet (10 mg total) by mouth daily.   APOAEQUORIN (PREVAGEN PO)    Take 10 mg by mouth.   BISOPROLOL  (ZEBETA ) 5 MG TABLET    TAKE 1 TABLET (5 MG TOTAL) BY MOUTH DAILY.   BLOOD GLUCOSE MONITORING SUPPL DEVI    1 each by Does not apply route daily. May substitute to any manufacturer covered by patient's insurance.   CLONIDINE  (CATAPRES ) 0.1 MG TABLET    Take 1 tablet (0.1 mg total) by mouth 2 (two) times daily.   FAMOTIDINE  (PEPCID ) 20 MG TABLET    TAKE ONE TABLET BY MOUTH DAILY AT 5PM AFTER SUPPER   GABAPENTIN  (NEURONTIN ) 300 MG CAPSULE    Take 1 capsule (300 mg total) by mouth 2 (two) times daily.   GLIPIZIDE  (GLUCOTROL ) 10 MG TABLET    Take 10 mg by mouth 2 (two) times daily.   HYDROCHLOROTHIAZIDE  (MICROZIDE ) 12.5 MG CAPSULE    Take 12.5 mg by mouth daily.   LANCET DEVICE MISC    1 each by Does not apply route daily. May substitute to any manufacturer covered by patient's insurance.   LANCETS MISC. MISC    1 each by Does not apply route daily. May substitute to any manufacturer covered by patient's insurance.   METFORMIN (GLUCOPHAGE) 500 MG TABLET    Take 500 mg by mouth 2 (two) times daily.   ROSUVASTATIN (CRESTOR) 20 MG TABLET    Take 20 mg by mouth every morning.   SITAGLIPTIN  (JANUVIA ) 100 MG TABLET    Take 1 tablet (100 mg total) by mouth daily.   TRELEGY ELLIPTA  100-62.5-25 MCG/ACT  AEPB    TAKE 1 PUFF BY MOUTH EVERY DAY  Modified Medications   No medications on file  Discontinued Medications   MELOXICAM  (MOBIC ) 7.5 MG TABLET    Take 1 tablet (7.5 mg total) by mouth daily.    Physical Exam:  Vitals:   11/21/23 1415  BP: 126/74  Pulse: 60  Temp: 97.9 F (36.6 C)  SpO2: 90%  Weight: 170 lb (77.1 kg)  Height: 5\' 5"  (1.651 m)   Body mass index is 28.29 kg/m. Wt Readings from Last 3 Encounters:  11/21/23 170 lb (77.1 kg)  09/15/23 171 lb 9.6 oz (77.8 kg)  08/29/23 171 lb 12.8 oz (77.9 kg)    Physical Exam Constitutional:  General: She is not in acute distress.    Appearance: She is well-developed. She is not diaphoretic.  HENT:     Head: Normocephalic and atraumatic.     Mouth/Throat:     Pharynx: No oropharyngeal exudate.  Eyes:     Conjunctiva/sclera: Conjunctivae normal.     Pupils: Pupils are equal, round, and reactive to light.  Cardiovascular:     Rate and Rhythm: Normal rate and regular rhythm.     Heart sounds: Normal heart sounds.  Pulmonary:     Effort: Pulmonary effort is normal.     Breath sounds: Normal breath sounds.  Abdominal:     General: Bowel sounds are normal.     Palpations: Abdomen is soft.  Musculoskeletal:     Cervical back: Normal range of motion and neck supple.     Right lower leg: No edema.     Left lower leg: No edema.  Skin:    General: Skin is warm and dry.  Neurological:     Mental Status: She is alert.  Psychiatric:        Mood and Affect: Mood normal.     Labs reviewed: Basic Metabolic Panel: Recent Labs    07/16/23 1612 09/15/23 1040  NA 134* 142  K 3.1* 3.9  CL 94* 105  CO2 33* 28  GLUCOSE 182* 119*  BUN 21 26*  CREATININE 0.94 1.10*  CALCIUM 9.6 10.3   Liver Function Tests: Recent Labs    09/15/23 1040  AST 12  ALT 7  BILITOT 0.3  PROT 7.0   No results for input(s): "LIPASE", "AMYLASE" in the last 8760 hours. No results for input(s): "AMMONIA" in the last 8760  hours. CBC: Recent Labs    07/16/23 1612 09/15/23 1040  WBC 13.1* 6.8  NEUTROABS 8.8* 3,380  HGB 12.1 13.6  HCT 37.4 41.5  MCV 88.0 88.7  PLT 371 236   Lipid Panel: Recent Labs    09/15/23 1040  CHOL 136  HDL 68  LDLCALC 48  TRIG 123  CHOLHDL 2.0   TSH: No results for input(s): "TSH" in the last 8760 hours. A1C: Lab Results  Component Value Date   HGBA1C 7.9 (H) 09/15/2023     Assessment/Plan  Type 2 diabetes mellitus with other specified complication, with long-term current use of insulin  [facility]) Assessment & Plan: Tolerating januvia  much better, blood glucose reading improved, will continue to monitor.   Orders: -     Microalbumin / creatinine urine ratio  Aortic atherosclerosis (HCC) Assessment & Plan: Noted on imaging, continues on crestor   Hyperlipidemia, unspecified hyperlipidemia type Assessment & Plan: Continues on crestor 20 mg daily    Essential hypertension Assessment & Plan: Blood pressure well controlled, goal bp <140/90 Continue current medications and dietary modifications follow metabolic panel   OSA (obstructive sleep apnea) Assessment & Plan: Continues on CPAP   COPD GOLD III Assessment & Plan: Stable at this time, continues on trelegy with as needed albuterol     Encounter for colorectal cancer screening -     Ambulatory referral to Gastroenterology  History of smoking -     Ambulatory Referral for Lung Cancer Scre  Chronic respiratory failure with hypoxia (HCC) Assessment & Plan: Continues on O2 PRN      Return in about 3 months (around 02/21/2024) for routine follow up.  Erick Oxendine K. Denney Fisherman [facility] [phone]

## 2023-11-22 ENCOUNTER — Ambulatory Visit: Payer: Self-pay | Admitting: Nurse Practitioner

## 2023-11-22 LAB — MICROALBUMIN / CREATININE URINE RATIO
Creatinine, Urine: 172 mg/dL (ref 20–275)
Microalb Creat Ratio: 10 mg/g{creat} (ref <30)
Microalb, Ur: 1.8 mg/dL

## 2023-11-24 DIAGNOSIS — I700 Atherosclerosis of aorta: Secondary | ICD-10-CM | POA: Insufficient documentation

## 2023-11-24 NOTE — Assessment & Plan Note (Signed)
 Continues on O2 PRN

## 2023-11-24 NOTE — Assessment & Plan Note (Signed)
 Blood pressure well controlled, goal bp <140/90 Continue current medications and dietary modifications follow metabolic panel

## 2023-11-24 NOTE — Assessment & Plan Note (Addendum)
 Tolerating januvia  much better, blood glucose reading improved, will continue to monitor.

## 2023-11-24 NOTE — Assessment & Plan Note (Signed)
 Noted on imaging, continues on crestor

## 2023-11-24 NOTE — Assessment & Plan Note (Signed)
 Stable at this time, continues on trelegy with as needed albuterol 

## 2023-11-24 NOTE — Assessment & Plan Note (Signed)
 Continues on CPAP.

## 2023-11-24 NOTE — Assessment & Plan Note (Signed)
 Continues on crestor 20 mg daily

## 2023-12-05 ENCOUNTER — Ambulatory Visit: Payer: 59 | Admitting: Nurse Practitioner

## 2023-12-08 ENCOUNTER — Encounter: Payer: Self-pay | Admitting: Acute Care

## 2023-12-09 ENCOUNTER — Ambulatory Visit (INDEPENDENT_AMBULATORY_CARE_PROVIDER_SITE_OTHER)

## 2023-12-09 DIAGNOSIS — I442 Atrioventricular block, complete: Secondary | ICD-10-CM

## 2023-12-11 LAB — CUP PACEART REMOTE DEVICE CHECK
Battery Impedance: 2805 Ohm
Battery Remaining Longevity: 23 mo
Battery Voltage: 2.72 V
Brady Statistic AP VP Percent: 82 %
Brady Statistic AP VS Percent: 1 %
Brady Statistic AS VP Percent: 13 %
Brady Statistic AS VS Percent: 4 %
Date Time Interrogation Session: 20250605121527
Implantable Lead Connection Status: 753985
Implantable Lead Connection Status: 753985
Implantable Lead Implant Date: 20131218
Implantable Lead Implant Date: 20131218
Implantable Lead Location: 753859
Implantable Lead Location: 753860
Implantable Lead Model: 5076
Implantable Lead Model: 5076
Implantable Pulse Generator Implant Date: 20131218
Lead Channel Impedance Value: 514 Ohm
Lead Channel Impedance Value: 537 Ohm
Lead Channel Pacing Threshold Amplitude: 0.625 V
Lead Channel Pacing Threshold Amplitude: 0.875 V
Lead Channel Pacing Threshold Pulse Width: 0.4 ms
Lead Channel Pacing Threshold Pulse Width: 0.4 ms
Lead Channel Setting Pacing Amplitude: 2 V
Lead Channel Setting Pacing Amplitude: 2.5 V
Lead Channel Setting Pacing Pulse Width: 0.4 ms
Lead Channel Setting Sensing Sensitivity: 2 mV
Zone Setting Status: 755011
Zone Setting Status: 755011

## 2023-12-19 ENCOUNTER — Ambulatory Visit: Payer: Self-pay | Admitting: Cardiology

## 2024-01-01 ENCOUNTER — Other Ambulatory Visit: Payer: Self-pay | Admitting: Internal Medicine

## 2024-01-01 DIAGNOSIS — J441 Chronic obstructive pulmonary disease with (acute) exacerbation: Secondary | ICD-10-CM

## 2024-01-02 ENCOUNTER — Encounter: Payer: Self-pay | Admitting: Nurse Practitioner

## 2024-01-02 ENCOUNTER — Ambulatory Visit (INDEPENDENT_AMBULATORY_CARE_PROVIDER_SITE_OTHER): Admitting: Nurse Practitioner

## 2024-01-02 VITALS — Ht 65.0 in

## 2024-01-02 DIAGNOSIS — Z1231 Encounter for screening mammogram for malignant neoplasm of breast: Secondary | ICD-10-CM

## 2024-01-02 DIAGNOSIS — E2839 Other primary ovarian failure: Secondary | ICD-10-CM

## 2024-01-02 DIAGNOSIS — Z0000 Encounter for general adult medical examination without abnormal findings: Secondary | ICD-10-CM

## 2024-01-02 DIAGNOSIS — Z1211 Encounter for screening for malignant neoplasm of colon: Secondary | ICD-10-CM | POA: Diagnosis not present

## 2024-01-02 DIAGNOSIS — Z1212 Encounter for screening for malignant neoplasm of rectum: Secondary | ICD-10-CM

## 2024-01-02 NOTE — Progress Notes (Signed)
 This service is provided via telemedicine  No vital signs collected/recorded due to the encounter was a telemedicine visit.   Location of patient (ex: home, work):  home  Patient consents to a telephone visit: yes  Location of the provider (ex: office, home):  [facility]   Name of any referring provider:  N/A  Names of all persons participating in the telemedicine service and their role in the encounter:  Marithza Malachi/ RMA, Roselie Conger, Champ Coma, NP, and Patient.   Time spent on call:  11

## 2024-01-02 NOTE — Progress Notes (Signed)
 Subjective:   Erika Leach is a 73 y.o. female who presents for Medicare Annual (Subsequent) preventive examination.  Visit Complete: Virtual I connected with  Erika Leach on 01/02/24 by a video and audio enabled telemedicine application and verified that I am speaking with the correct person using two identifiers.  Patient Location: Home  Provider Location: Office/Clinic  I discussed the limitations of evaluation and management by telemedicine. The patient expressed understanding and agreed to proceed.  Vital Signs: Because this visit was a virtual/telehealth visit, some criteria may be missing or patient reported. Any vitals not documented were not able to be obtained and vitals that have been documented are patient reported.   Cardiac Risk Factors include: advanced age (>15men, >24 women);sedentary lifestyle;diabetes mellitus;dyslipidemia;hypertension     Objective:    Today's Vitals   01/02/24 1122  Height: 5' 5 (1.651 m)   Body mass index is 28.29 kg/m.     11/10/2023    2:49 PM 09/15/2023   10:06 AM 08/29/2023   10:01 AM 07/16/2023   12:10 PM 07/07/2019    3:00 PM 03/23/2019    3:30 PM 03/21/2019    3:59 AM  Advanced Directives  Does Patient Have a Medical Advance Directive? No No No No No No No  Would patient like information on creating a medical advance directive? No - Patient declined No - Patient declined No - Patient declined No - Patient declined No - Patient declined Yes (Inpatient - patient defers creating a medical advance directive at this time - Information given) Yes (Inpatient - patient requests chaplain consult to create a medical advance directive)    Current Medications (verified) Outpatient Encounter Medications as of 01/02/2024  Medication Sig   albuterol  (PROVENTIL ) (2.5 MG/3ML) 0.083% nebulizer solution Take 3 mLs (2.5 mg total) by nebulization every 6 (six) hours as needed for wheezing or shortness of breath.   albuterol  (VENTOLIN  HFA) 108  (90 Base) MCG/ACT inhaler Inhale 2 puffs into the lungs every 6 (six) hours as needed for wheezing or shortness of breath.   amLODipine  (NORVASC ) 10 MG tablet Take 1 tablet (10 mg total) by mouth daily.   Apoaequorin (PREVAGEN PO) Take 10 mg by mouth.   bisoprolol  (ZEBETA ) 5 MG tablet TAKE 1 TABLET (5 MG TOTAL) BY MOUTH DAILY.   Blood Glucose Monitoring Suppl DEVI 1 each by Does not apply route daily. May substitute to any manufacturer covered by patient's insurance.   cloNIDine  (CATAPRES ) 0.1 MG tablet Take 1 tablet (0.1 mg total) by mouth 2 (two) times daily.   famotidine  (PEPCID ) 20 MG tablet TAKE ONE TABLET BY MOUTH DAILY AT 5PM AFTER SUPPER   gabapentin  (NEURONTIN ) 300 MG capsule Take 1 capsule (300 mg total) by mouth 2 (two) times daily.   glipiZIDE  (GLUCOTROL ) 10 MG tablet Take 10 mg by mouth 2 (two) times daily.   hydrochlorothiazide  (MICROZIDE ) 12.5 MG capsule Take 12.5 mg by mouth daily.   Lancet Device MISC 1 each by Does not apply route daily. May substitute to any manufacturer covered by patient's insurance.   Lancets Misc. MISC 1 each by Does not apply route daily. May substitute to any manufacturer covered by patient's insurance.   metFORMIN (GLUCOPHAGE) 500 MG tablet Take 500 mg by mouth 2 (two) times daily.   rosuvastatin (CRESTOR) 20 MG tablet Take 20 mg by mouth every morning.   sitaGLIPtin  (JANUVIA ) 100 MG tablet Take 1 tablet (100 mg total) by mouth daily.   TRELEGY ELLIPTA  100-62.5-25 MCG/ACT AEPB  TAKE 1 PUFF BY MOUTH EVERY DAY   No facility-administered encounter medications on file as of 01/02/2024.    Allergies (verified) Patient has no known allergies.   History: 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   COPD (chronic obstructive pulmonary disease) (HCC)    Diabetes (HCC)    HTN (hypertension)    poorly controlled    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   Past Surgical History:  Procedure Laterality Date   APPENDECTOMY     LEFT HEART CATHETERIZATION WITH CORONARY ANGIOGRAM N/A 04/30/2013   Procedure: LEFT HEART CATHETERIZATION WITH CORONARY ANGIOGRAM;  Surgeon: Peter M Swaziland, MD;  Location[facility] C[facility] LAB;  Service: Cardiovascular;  Laterality: N/A;   PERMANENT PACEMAKER INSERTION N/A 06/24/2012   Procedure: PERMANENT PACEMAKER INSERTION;  Surgeon: Elspeth JAYSON Sage, MD;  Location: [facility] CATH LAB;  Service: Cardiovascular;  Laterality: N/A;   TONSILLECTOMY  1965   TUBAL LIGATION     Family History  Problem Relation Age of Onset   Ovarian cancer Mother    Heart attack Mother        Died age 63   Pneumonia Father    Breast cancer Sister    Emphysema Brother    Heart attack Brother        3 MI's - 1st age 59   Social History   Socioeconomic History   Marital status: Widowed    Spouse name: Not on file   Number of children: 3   Years of education: Not on file   Highest education level: Some college, no degree  Occupational History   Occupation: Retred  Tobacco Use   Smoking status: Former    Current packs/day: 0.00    Average packs/day: 1 pack/day for 46.0 years (46.0 ttl pk-yrs)    Types: Cigarettes    Start date: 62    Quit date: 2015    Years since quitting: 10.4   Smokeless tobacco: Never  Vaping Use   Vaping status: Never Used  Substance and Sexual Activity   Alcohol use: No    Alcohol/week: 0.0 standard drinks of alcohol    Comment: rare - special occasions   Drug use: No   Sexual activity: Not Currently  Other Topics Concern   Not on file  Social History Narrative   Diet:  Left it Blank      Caffeine: Yes      Married Widowed , if yes what year:        Do you live in a house, apartment, assisted living, condo, trailer, ect: House      Is it one or more stories: yes      How many persons live in your home? one      Pets:  yes a dog      Highest level or education completed:  12 th      Current/Past profession: Building surveyor      Exercise:        No          Type and how often:          Living Will: No   DNR: No   POA/HPOA: No      Functional Status:   Do you have difficulty bathing or dressing yourself? No   Do you have difficulty preparing food or eating? No   Do  you have difficulty managing your medications? No   Do you have difficulty managing your finances? No   Do you have difficulty affording your medications? No   Social Drivers of Corporate investment banker Strain: Low Risk  (01/01/2024)   Overall Financial Resource Strain (CARDIA)    Difficulty of Paying Living Expenses: Not very hard  Food Insecurity: No Food Insecurity (01/01/2024)   Hunger Vital Sign    Worried About Running Out of Food in the Last Year: Never true    Ran Out of Food in the Last Year: Never true  Transportation Needs: No Transportation Needs (01/01/2024)   PRAPARE - Administrator, Civil Service (Medical): No    Lack of Transportation (Non-Medical): No  Physical Activity: Insufficiently Active (01/01/2024)   Exercise Vital Sign    Days of Exercise per Week: 1 day    Minutes of Exercise per Session: 10 min  Stress: No Stress Concern Present (01/01/2024)   [employer] of Occupational Health - Occupational Stress Questionnaire    Feeling of Stress: Not at all  Social Connections: Moderately Integrated (01/01/2024)   Social Connection and Isolation Panel    Frequency of Communication with Friends and Family: More than three times a week    Frequency of Social Gatherings with Friends and Family: Once a week    Attends Religious Services: More than 4 times per year    Active Member of [employer] or Organizations: Yes    Attends Banker Meetings: 1 to 4 times per year    Marital Status: Widowed  Recent Concern: Social Connections - Moderately Isolated (11/20/2023)   Social Connection and Isolation Panel    Frequency of Communication with Friends  and Family: More than three times a week    Frequency of Social Gatherings with Friends and Family: Three times a week    Attends Religious Services: More than 4 times per year    Active Member of Clubs or Organizations: No    Attends Banker Meetings: Not on file    Marital Status: Widowed    Tobacco Counseling Counseling given: Not Answered   Clinical Intake:  Pre-visit preparation completed: Yes  Pain : No/denies pain     BMI - recorded: 28 Nutritional Risks: None Diabetes: Yes  How often do you need to have someone help you when you read instructions, pamphlets, or other written materials from your doctor or pharmacy?: 1 - Never         Activities of Daily Living    01/02/2024   11:19 AM 01/01/2024    8:51 AM  In your present state of health, do you have any difficulty performing the following activities:  Hearing? 0 0  Vision? 0 0  Difficulty concentrating or making decisions? 1 0  Walking or climbing stairs? 1 0  Dressing or bathing? 0 0  Doing errands, shopping? 0 0  Preparing Food and eating ? N N  Using the Toilet? N N  In the past six months, have you accidently leaked urine? Y Y  Do you have problems with loss of bowel control? N N  Managing your Medications? N N  Managing your Finances? N N  Housekeeping or managing your Housekeeping? N N    Patient Care Team: Caro Harlene POUR, NP as PCP - General (Geriatric Medicine) Maree Lonni Inks, MD as Consulting Physician (Ophthalmology)  Indicate any recent Medical Services you may have received from other than [facility] providers in the past  year (date may be approximate).     Assessment:   This is a routine wellness examination for Erika Leach.  Hearing/Vision screen No results found.   Goals Addressed   None    Depression Screen    01/02/2024   11:16 AM 11/10/2023    2:49 PM 08/29/2023   10:01 AM  PHQ 2/9 Scores  PHQ - 2 Score 0 0 0    Fall Risk    01/02/2024   11:15 AM  01/01/2024    8:51 AM 11/10/2023    2:48 PM 08/29/2023   10:01 AM  Fall Risk   Falls in the past year? 0 0 1 1  Number falls in past yr: 0 0 0 1  Injury with Fall? 0 1 1 1   Risk for fall due to : Impaired balance/gait;Impaired mobility  No Fall Risks   Follow up Falls evaluation completed  Falls evaluation completed Falls evaluation completed    MEDICARE RISK AT HOME: Medicare Risk at Home Any stairs in or around the home?: Yes If so, are there any without handrails?: No Home free of loose throw rugs in walkways, pet beds, electrical cords, etc?: Yes Adequate lighting in your home to reduce risk of falls?: Yes Life alert?: No Use of a cane, walker or w/c?: Yes Grab bars in the bathroom?: No Shower chair or bench in shower?: Yes Elevated toilet seat or a handicapped toilet?: Yes  TIMED UP AND GO:  Was the test performed?  No    Cognitive Function:        01/02/2024   11:16 AM  6CIT Screen  What Year? 0 points  What month? 0 points  What time? 0 points  Count back from 20 0 points  Months in reverse 2 points  Repeat phrase 0 points  Total Score 2 points    Immunizations Immunization History  Administered Date(s) Administered   Fluad Quad(high Dose 65+) 04/18/2021, 03/24/2023   Influenza, High Dose Seasonal PF 05/21/2018, 04/12/2019   PFIZER Comirnaty(Gray Top)Covid-19 Tri-Sucrose Vaccine 12/06/2020   PFIZER(Purple Top)SARS-COV-2 Vaccination 08/13/2019, 09/03/2019, 05/03/2020   PNEUMOCOCCAL CONJUGATE-20 03/28/2023   Pfizer Covid-19 Vaccine Bivalent Booster 64yrs & up 05/16/2021   Pfizer(Comirnaty)Fall Seasonal Vaccine 12 years and older 03/24/2023   Zoster Recombinant(Shingrix) 04/12/2019, 05/17/2022    TDAP status: Up to date  Flu Vaccine status: Up to date  Pneumococcal vaccine status: Up to date  Covid-19 vaccine status: Information provided on how to obtain vaccines.   Qualifies for Shingles Vaccine? Yes   Zostavax completed No   Shingrix Completed?:  Yes  Screening Tests Health Maintenance  Topic Date Due   OPHTHALMOLOGY EXAM  Never done   Colonoscopy  12/26/2022   DTaP/Tdap/Td (1 - Tdap) 07/08/2024 (Originally 09/10/1969)   COVID-19 Vaccine (7 - Pfizer risk 2024-25 season) 07/08/2024 (Originally 09/21/2023)   Hepatitis C Screening  07/08/2024 (Originally 09/10/1968)   INFLUENZA VACCINE  02/06/2024   HEMOGLOBIN A1C  03/17/2024   Lung Cancer Screening  08/21/2024   Diabetic kidney evaluation - eGFR measurement  09/14/2024   Diabetic kidney evaluation - Urine ACR  11/20/2024   FOOT EXAM  11/20/2024   Medicare Annual Wellness (AWV)  01/01/2025   MAMMOGRAM  02/06/2025   Pneumococcal Vaccine: 50+ Years  Completed   DEXA SCAN  Completed   Zoster Vaccines- Shingrix  Completed   Hepatitis B Vaccines  Aged Out   HPV VACCINES  Aged Out   Meningococcal B Vaccine  Aged Out    Health Maintenance  Health Maintenance Due  Topic Date Due   OPHTHALMOLOGY EXAM  Never done   Colonoscopy  12/26/2022    Colorectal cancer screening: Referral to GI placed today. Pt aware the office will call re: appt.  Mammogram status: Completed 02/2023. Repeat every year  Bone Density status: Ordered today. Pt provided with contact info and advised to call to schedule appt.  Lung Cancer Screening: (Low Dose CT Chest recommended if Age 63-80 years, 20 pack-year currently smoking OR have quit w/in 15years.) does qualify.   Lung Cancer Screening Referral: completed   Additional Screening:  Hepatitis C Screening: does qualify; Completed   Vision Screening: Recommended annual ophthalmology exams for early detection of glaucoma and other disorders of the eye. Is the patient up to date with their annual eye exam?  Yes  Who is the provider or what is the name of the office in which the patient attends annual eye exams? [facility] If pt is not established with a provider, would they like to be referred to a provider to establish care? No .   Dental Screening:  Recommended annual dental exams for proper oral hygiene  Diabetic Foot Exam: Diabetic Foot Exam: Completed 11/21/2023  Community Resource Referral / Chronic Care Management: CRR required this visit?  No   CCM required this visit?  No     Plan:     I have personally reviewed and noted the following in the patient's chart:   Medical and social history Use of alcohol, tobacco or illicit drugs  Current medications and supplements including opioid prescriptions. Patient is not currently taking opioid prescriptions. Functional ability and status Nutritional status Physical activity Advanced directives List of other physicians Hospitalizations, surgeries, and ER visits in previous 12 months Vitals Screenings to include cognitive, depression, and falls Referrals and appointments  In addition, I have reviewed and discussed with patient certain preventive protocols, quality metrics, and best practice recommendations. A written personalized care plan for preventive services as well as general preventive health recommendations were provided to patient.     Harlene MARLA An, NP   01/02/2024   After Visit Summary: (MyChart) Due to this being a telephonic visit, the after visit summary with patients personalized plan was offered to patient via MyChart

## 2024-01-02 NOTE — Patient Instructions (Signed)
  Ms. Erika Leach , Thank you for taking time to come for your Medicare Wellness Visit. I appreciate your ongoing commitment to your health goals. Please review the following plan we discussed and let me know if I can assist you in the future.   Referral placed for colonoscopy- you should be hearing from someone to make an appt  To call [phone] to schedule bone density and mammogram    This is a list of the screening recommended for you and due dates:  Health Maintenance  Topic Date Due   Eye exam for diabetics  Never done   Colon Cancer Screening  12/26/2022   DTaP/Tdap/Td vaccine (1 - Tdap) 07/08/2024*   COVID-19 Vaccine (7 - Pfizer risk 2024-25 season) 07/08/2024*   Hepatitis C Screening  07/08/2024*   Flu Shot  02/06/2024   Hemoglobin A1C  03/17/2024   Screening for Lung Cancer  08/21/2024   Yearly kidney function blood test for diabetes  09/14/2024   Yearly kidney health urinalysis for diabetes  11/20/2024   Complete foot exam   11/20/2024   Medicare Annual Wellness Visit  01/01/2025   Mammogram  02/06/2025   Pneumococcal Vaccine for age over 41  Completed   DEXA scan (bone density measurement)  Completed   Zoster (Shingles) Vaccine  Completed   Hepatitis B Vaccine  Aged Out   HPV Vaccine  Aged Out   Meningitis B Vaccine  Aged Out  *Topic was postponed. The date shown is not the original due date.

## 2024-01-31 ENCOUNTER — Other Ambulatory Visit: Payer: Self-pay | Admitting: Nurse Practitioner

## 2024-01-31 DIAGNOSIS — E1169 Type 2 diabetes mellitus with other specified complication: Secondary | ICD-10-CM

## 2024-02-03 NOTE — Progress Notes (Signed)
 Remote pacemaker transmission.

## 2024-02-03 NOTE — Addendum Note (Signed)
 Addended by: VICCI SELLER A on: 02/03/2024 03:23 PM   Modules accepted: Orders

## 2024-02-23 ENCOUNTER — Encounter: Admitting: Nurse Practitioner

## 2024-02-23 NOTE — Progress Notes (Signed)
 This encounter was created in error - please disregard.

## 2024-03-09 ENCOUNTER — Encounter

## 2024-03-24 ENCOUNTER — Other Ambulatory Visit: Payer: Self-pay | Admitting: Internal Medicine

## 2024-03-24 ENCOUNTER — Ambulatory Visit

## 2024-03-30 ENCOUNTER — Ambulatory Visit
Admission: RE | Admit: 2024-03-30 | Discharge: 2024-03-30 | Disposition: A | Discharge status: Home / Self Care | Source: Ambulatory Visit | Attending: Nurse Practitioner | Admitting: Nurse Practitioner

## 2024-03-30 DIAGNOSIS — Z1231 Encounter for screening mammogram for malignant neoplasm of breast: Secondary | ICD-10-CM

## 2024-04-06 ENCOUNTER — Ambulatory Visit: Admitting: Podiatry

## 2024-04-07 ENCOUNTER — Ambulatory Visit (INDEPENDENT_AMBULATORY_CARE_PROVIDER_SITE_OTHER): Admitting: Podiatry

## 2024-04-07 ENCOUNTER — Ambulatory Visit (INDEPENDENT_AMBULATORY_CARE_PROVIDER_SITE_OTHER)

## 2024-04-07 DIAGNOSIS — M2012 Hallux valgus (acquired), left foot: Secondary | ICD-10-CM

## 2024-04-07 DIAGNOSIS — M7752 Other enthesopathy of left foot: Secondary | ICD-10-CM | POA: Diagnosis not present

## 2024-04-07 DIAGNOSIS — M25572 Pain in left ankle and joints of left foot: Secondary | ICD-10-CM

## 2024-04-07 MED ORDER — TRIAMCINOLONE ACETONIDE 10 MG/ML IJ SUSP
10.0000 mg | Freq: Once | INTRAMUSCULAR | Status: AC
  Administered 2024-04-07: 10 mg

## 2024-04-07 NOTE — Progress Notes (Signed)
 Patient presents presents with pain at first metatarsal phalangeal joint beginning 3 days ago.  The joints she woke up and the joint was extremely painful and somewhat red.  Does not recall any injury to it.  No history of gout in the past.  No fever chills nausea or vomiting.   Physical exam:  General appearance: Pleasant, and in no acute distress. AOx3.  Vascular: Pedal pulses: DP 2/4 bilaterally, PT 2/4 bilaterally.  Mild to moderate local edema around first MTP left capillary fill time he did bilaterally.  Neurological: Light touch intact feet bilaterally.  Normal Achilles reflex bilaterally.  No clonus or spasticity noted.   Dermatologic:   Some redness around first MTP left  Musculoskeletal: Tenderness to palpation around the first metatarsal phalangeal joint left.  Tenderness with range of motion first MTP with splinting.  Hallux abductovalgus deformity left.  Radiographs: 3 views foot left: Moderate hallux valgus deformity left.  Normal joint space first MTP.  No is any fractures or dislocations.  No erosive changes noted.  Normal bone density.  No Insa bone tumors.  Diagnosis: 1.  Arthralgia first metatarsal phalangeal joint left. 2.  Hallux valgus left  Plan: -Discussed with her the pain in the joint.  Clinically appears that it might be a gout acute gout flare.  Will do some labs including uric acid and CMP.  Briefly discussed gout with her and explained its etiology and treatment.  She does have an underlying hallux valgus deformity left.  She could have some early arthritic changes in the joint. -RICE -injected 3cc 2:1 mixture 0.5 cc Marcaine:Kenolog 10mg /14ml at first metatarsal phalangeal joint left.  -Labs ordered: Uric acid and CMP.   Return 1 week follow-up injection first MTP left

## 2024-04-09 LAB — COMPREHENSIVE METABOLIC PANEL WITH GFR
AG Ratio: 2 (calc) (ref 1.0–2.5)
ALT: 12 U/L (ref 6–29)
AST: 18 U/L (ref 10–35)
Albumin: 4.9 g/dL (ref 3.6–5.1)
Alkaline phosphatase (APISO): 61 U/L (ref 37–153)
BUN/Creatinine Ratio: 19 (calc) (ref 6–22)
BUN: 21 mg/dL (ref 7–25)
CO2: 26 mmol/L (ref 20–32)
Calcium: 10.1 mg/dL (ref 8.6–10.4)
Chloride: 104 mmol/L (ref 98–110)
Creat: 1.13 mg/dL — ABNORMAL HIGH (ref 0.60–1.00)
Globulin: 2.4 g/dL (ref 1.9–3.7)
Glucose, Bld: 117 mg/dL (ref 65–139)
Potassium: 3.4 mmol/L — ABNORMAL LOW (ref 3.5–5.3)
Sodium: 142 mmol/L (ref 135–146)
Total Bilirubin: 0.4 mg/dL (ref 0.2–1.2)
Total Protein: 7.3 g/dL (ref 6.1–8.1)
eGFR: 51 mL/min/1.73m2 — ABNORMAL LOW (ref >=60)

## 2024-04-09 LAB — URIC ACID: Uric Acid, Serum: 7.3 mg/dL — ABNORMAL HIGH (ref 2.5–7.0)

## 2024-04-12 ENCOUNTER — Ambulatory Visit: Admitting: Podiatry

## 2024-04-12 ENCOUNTER — Encounter: Payer: Self-pay | Admitting: Podiatry

## 2024-04-12 DIAGNOSIS — M25572 Pain in left ankle and joints of left foot: Secondary | ICD-10-CM | POA: Diagnosis not present

## 2024-04-12 MED ORDER — METHYLPREDNISOLONE 4 MG PO TBPK: ORAL_TABLET | ORAL | 0 refills | Status: DC

## 2024-04-12 MED ORDER — TRIAMCINOLONE ACETONIDE 10 MG/ML IJ SUSP
10.0000 mg | Freq: Once | INTRAMUSCULAR | Status: AC
  Administered 2024-04-12: 10 mg

## 2024-04-12 NOTE — Patient Instructions (Signed)
 This

## 2024-04-12 NOTE — Progress Notes (Signed)
 Presents follow-up with pain first metatarsal phalangeal joint left possible gout flare.  No improvement with the injection.  Had a little bit of relief when needed but pain still continuing.  No fever chills nausea or vomiting.   Physical exam:  General appearance: Pleasant, and in no acute distress. AOx3.  Vascular: Pedal pulses: DP 2/4 bilaterally, PT 2/4 bilaterally. Mild to moderate edema lower legs bilaterally. Capillary fill time immediate bilaterally.  Neurological: Grossly intact bilaterally  Dermatologic:   Redness around first MTP left is resolved.  Normal temperature.  Musculoskeletal: Tenderness first metatarsophalangeal joint left with palpation and range of motion.   Diagnosis: 1.  Arthralgia of first MTP left.  Plan: -Discussed with the labs are uric acid was 7.3.  Clinically it still sounds like gout.  She has not had any flares before at this point we will treat it symptomatically and proceed from there. -Medrol  Dosepak take as directed -injected 3cc 2:1 mixture 0.5 cc Marcaine:Kenolog 10mg /54ml at first metatarsal phalangeal joint left.    Return 2 weeks follow-up injection first MTP left

## 2024-04-27 ENCOUNTER — Ambulatory Visit (INDEPENDENT_AMBULATORY_CARE_PROVIDER_SITE_OTHER): Admitting: Podiatry

## 2024-04-27 ENCOUNTER — Encounter: Payer: Self-pay | Admitting: Podiatry

## 2024-04-27 DIAGNOSIS — M25572 Pain in left ankle and joints of left foot: Secondary | ICD-10-CM

## 2024-04-27 NOTE — Progress Notes (Signed)
 Presents follow-up injection first metatarsophalangeal joint left.  Doing much better.  Only a little bit of soreness at this point.  No redness or swelling noted at this point.   Physical exam:  General appearance: Pleasant, and in no acute distress. AOx3.  Vascular: Pedal pulses: DP 2/4 bilaterally, PT 2/4 bilaterally. Mild edema lower legs bilaterally. Capillary fill time immediate bilaterally.  Neurological:  Paresthesias and numbness bilaterally  Dermatologic:   Skin normal temperature bilaterally.  Skin normal color, tone, and texture bilaterally.   Musculoskeletal: Slight soreness on the dorsal aspect of the first metatarsal phalangeal joint of the left foot.  No tenderness with range of motion first MTP left.  No crepitation noted.    Diagnosis: 1.  Arthralgia first metatarsophalangeal joint left  Plan: -Established office visit for evaluation and management level 3. - Discussed with her the pain in the joint.  At this point just use some topical diclofenac gel as needed and ice 20 minutes an hour 2 or 3 times a day until the remaining discomfort is resolved. -Discussed possibility of acute gout that caused the joint pain.  If she does get another flare she should call the office immediately so we can try to get a joint fluid sample to test for urate crystals  Return as needed

## 2024-04-28 ENCOUNTER — Other Ambulatory Visit: Payer: Self-pay | Admitting: Nurse Practitioner

## 2024-04-28 DIAGNOSIS — E1169 Type 2 diabetes mellitus with other specified complication: Secondary | ICD-10-CM

## 2024-05-10 ENCOUNTER — Encounter: Payer: Self-pay | Admitting: Radiology

## 2024-06-08 ENCOUNTER — Encounter

## 2024-07-15 ENCOUNTER — Encounter: Payer: Self-pay | Admitting: Internal Medicine

## 2024-07-15 ENCOUNTER — Ambulatory Visit (INDEPENDENT_AMBULATORY_CARE_PROVIDER_SITE_OTHER): Admitting: Internal Medicine

## 2024-07-15 VITALS — BP 102/66 | HR 69 | Temp 97.2°F | Ht 65.0 in | Wt 156.0 lb

## 2024-07-15 DIAGNOSIS — Z87891 Personal history of nicotine dependence: Secondary | ICD-10-CM | POA: Diagnosis not present

## 2024-07-15 DIAGNOSIS — J9611 Chronic respiratory failure with hypoxia: Secondary | ICD-10-CM

## 2024-07-15 DIAGNOSIS — J449 Chronic obstructive pulmonary disease, unspecified: Secondary | ICD-10-CM

## 2024-07-15 NOTE — Assessment & Plan Note (Addendum)
 Quit smoking 2014  PFT's 10/19/14   FEV1 1.24 (57 % ) ratio 53  p 9 % improvement from saba p ?  prior to study with DLCO  38 % corrects to 52 % for alv volume   - 11/29/2015    try bevespi   - 03/20/2019 admit with aecopd with Eos 1.5  - 04/01/2019    changed spiriva  to respimat/ continue symb 160 2bid - 04/01/2019 pred as plan d x 6 days only   - 05/03/19 changed symb to 80 due to thrush  - PFT's  12/14/2019  FEV1 0.96 (46 % ) ratio 0.49  p saba w/in one hour prior to study with DLCO  8.28 (39%) corrects to 2.16 (53%)  for alv volume and FV curve classic concave  - 03/20/2020  After extensive coaching inhaler device,  effectiveness =    75% smi > continue stiolto but use 1 puff each am instead of 2 (strongly doubt it's causing oral candidiasis)  -07/10/2020  refilled prednisone  x 6 days cycles only and added gerd rx  - 04/18/21  Added flutter valve  - 08/22/21 changed to trelegy  -  LDSCT  08/22/23    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

## 2024-07-15 NOTE — Assessment & Plan Note (Addendum)
"   New start on outpt 02 03/20/2019 p d/c from admit  04/01/2019 Patient Saturations on Room Air at Rest = 96% >>> Room Air while Ambulating = 88% and on  2 Liters of pulsed oxygen while Ambulating = 93% - pt request change to cyclinders only 07/10/2020  > done - requesting d/c  02 07/15/2024  - .07/15/2024   Walked on RA   x  3  lap(s) =  approx 750  ft  @ nl  pace, stopped due to end of study  with lowest 02 sats 92% and no sob so  >>>  d/c all 02    "

## 2024-07-15 NOTE — Patient Instructions (Signed)
 No change in medications   We will walk you today to see if it's ok to completely stop your oxygen.   Please schedule a follow up visit in 12 months but call sooner if needed

## 2024-07-15 NOTE — Assessment & Plan Note (Addendum)
 Quit smoking 2014   Low-dose CT lung cancer screening is recommended for patients who are 36-74 years of age with a 20+ pack-year history of smoking and who are currently smoking or quit <=15 years ago. No coughing up blood  No unintentional weight loss of > 15 pounds in the last 6 months - pt is eligible for scanning yearly until 2029 > continue LCS q February

## 2024-07-15 NOTE — Progress Notes (Signed)
 "  Subjective:   Patient ID: Erika Leach, female    DOB: 1950/07/24, 74 y.o.   MRN: [id number]    Brief patient profile:  89  yobf  Quit smoking 2014 @ wt 89 with h/o childhood asthma outgrew by [facility] with onset breathing problems during pregnancies but no maint rx then worse 2013 /  and on inhalers ever since so referred to pulmonary clinic 11/01/2015 by Dr Norval with dx of GOLD III copd 10/2014 .    History of Present Illness  11/01/2015 1st [facility] office visit/ Matisha Termine   Chief Complaint  Patient presents with   Pulmonary Consult    Referred by Dr. Glo Norval. Pt c/o SOB x 4 yrs- all the time with or without any exertion. She also c/o prod cough with white sputum.    usually no cough first thing then as stir around starts cough/  some coughing also  at bedtime but doesn't keep her up, cough drops work the best/ so severe she gags >  proair  helps for a few hours then bad again, no better on qvar so dc'd. Can lose breath sitting with / without cough / gag On Lisinopril  since quit smoking and pattern has persisted daily since then even on pred which just makes her fat, doesn't really help sob or cough rec Stop lisiopril and start valsartan  160/12.5 one daily and your cough should gradually resolve over several weeks  Only use your albuterol  as a rescue medication  GERD    Please schedule a follow up office visit in 4 weeks, sooner if needed to address any and all of  the rest of your respiratory symptoms that don't resolve off lisinopril  (it's the only way to sort this out)      05/21/2021  f/u ov/Ridley Dileo re: GOLD 3/ 02 dep at hs and prn maint on stiolto   Chief Complaint  Patient presents with   Follow-up    3 mo f/u. States she has had one flair up since last visit but she is back to feeling better.   Dyspnea:  worse than prior but not really walking  Cough: none Sleeping: flat bed one pillow SABA use: not much/last prednisone  was one month prior to OV   02: 2lpm hs and prn  daytime p ex Covid status:  vax  x 3/ infected original wave in spring of 2020  Rec LDSCT > did not schedule    09/11/2021  f/u ov/Aloys Hupfer re: GOLD 3 / 02 hs and prn maint on trelegy   Dyspnea:  just finished trip to [place] and did fine but not checking sats as rec  Cough: none  Sleeping: flat bed one pillows  SABA use: once a week 02: 2lpm hs and prn daytime  Covid status:   3 vax / one infection Rec No change in medications  Referred for LCS   01/23/2023  f/u ov/Jaqulyn Chancellor re: GOLD 3 / 02 prn   maint on telegy / did not do LDSCT yet   Chief Complaint  Patient presents with   Follow-up    Doing well.  Some SOB with hot weather.   Dyspnea:  Not limited by breathing from desired activities   Cough: none  Sleeping: flat bed one pillow fine  SABA use: sometimes needs  with humidity  but fine in A/c apparently 02:prn  REC LCS F/u yearly   LDSCT  08/22/23   RADS 1 Centrilobular and paraseptal emphysema    07/15/2024  f/u ov/Finneas Mathe re: GOLD  3 / 02 prn     maint on Trelegy Chief Complaint  Patient presents with   COPD    Overall, doing well.  Only has SOB with strenuous activities.  Dyspnea:  shopping ok / no hc parking  Cough: none  Sleeping: flat bed/ one pillow s  resp cc  SABA use: not even once a week  02: not using it     No obvious day to day or daytime variability or assoc excess/ purulent sputum or mucus plugs or hemoptysis or cp or chest tightness, subjective wheeze or overt sinus or hb symptoms.    Also denies any obvious fluctuation of symptoms with weather or environmental changes or other aggravating or alleviating factors except as outlined above   No unusual exposure hx or h/o childhood pna/ asthma or knowledge of premature birth.  Current Allergies, Complete Past Medical History, Past Surgical History, Family History, and Social History were reviewed in [employer] record.  ROS  The following are not active complaints unless bolded Hoarseness,  sore throat, dysphagia, dental problems, itching, sneezing,  nasal congestion or discharge of excess mucus or purulent secretions, ear ache,   fever, chills, sweats, unintended wt loss or wt gain, classically pleuritic or exertional cp,  orthopnea pnd or arm/hand swelling  or leg swelling, presyncope, palpitations, abdominal pain, anorexia, nausea, vomiting, diarrhea  or change in bowel habits or change in bladder habits, change in stools or change in urine, dysuria, hematuria,  rash, arthralgias, visual complaints, headache, numbness, weakness or ataxia or problems with walking or coordination,  change in mood or  memory.          Outpatient Medications Prior to Visit  Medication Sig Dispense Refill   albuterol  (PROVENTIL ) (2.5 MG/3ML) 0.083% nebulizer solution Take 3 mLs (2.5 mg total) by nebulization every 6 (six) hours as needed for wheezing or shortness of breath. 360 mL 11   albuterol  (VENTOLIN  HFA) 108 (90 Base) MCG/ACT inhaler Inhale 2 puffs into the lungs every 6 (six) hours as needed for wheezing or shortness of breath. 8 g 11   amLODipine  (NORVASC ) 10 MG tablet Take 1 tablet (10 mg total) by mouth daily.  3   Apoaequorin (PREVAGEN PO) Take 10 mg by mouth.     bisoprolol  (ZEBETA ) 5 MG tablet TAKE 1 TABLET (5 MG TOTAL) BY MOUTH DAILY. 90 tablet 3   cloNIDine  (CATAPRES ) 0.1 MG tablet Take 1 tablet (0.1 mg total) by mouth 2 (two) times daily. 60 tablet 0   famotidine  (PEPCID ) 20 MG tablet TAKE ONE TABLET BY MOUTH DAILY AT 5PM AFTER SUPPER 90 tablet 2   gabapentin  (NEURONTIN ) 300 MG capsule Take 1 capsule (300 mg total) by mouth 2 (two) times daily.     glipiZIDE  (GLUCOTROL ) 10 MG tablet Take 10 mg by mouth 2 (two) times daily.     hydrochlorothiazide  (MICROZIDE ) 12.5 MG capsule Take 12.5 mg by mouth daily.     JANUVIA  100 MG tablet TAKE 1 TABLET BY MOUTH EVERY DAY 90 tablet 0   metFORMIN (GLUCOPHAGE) 500 MG tablet Take 500 mg by mouth 2 (two) times daily.  11   rosuvastatin (CRESTOR) 20 MG  tablet Take 20 mg by mouth every morning.     TRELEGY ELLIPTA  100-62.5-25 MCG/ACT AEPB TAKE 1 PUFF BY MOUTH EVERY DAY 60 each 6   methylPREDNISolone  (MEDROL  DOSEPAK) 4 MG TBPK tablet Take as directed 21 tablet 0   No facility-administered medications prior to visit.            SABRA  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   COPD (chronic obstructive pulmonary disease) (HCC)    Diabetes (HCC)    HTN (hypertension)    poorly controlled   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                   Objective:   Physical Exam  Wts  07/15/2024          156   01/23/2023        188   09/11/2021         177   05/21/2021      187  07/10/2020          187  03/20/2020       188  12/14/2019         197  06/15/2019       198  05/03/2019     196  04/01/2019       196   11/29/2015       200   11/01/15 200 lb 12.8 oz (91.082 kg)  02/14/15 197 lb (89.359 kg)  11/23/14 193 lb 9.6 oz (87.816 kg)   Vital signs reviewed  07/15/2024  - Note at rest 02 sats  92% on RA   General appearance:    pleasant amb bf nad   HEENT : Oropharynx  clear   Nasal turbinates nl    NECK :  without  apparent JVD/ palpable Nodes/TM    LUNGS: no acc muscle use,  Mild barrel  contour chest wall with bilateral  Distant bs s audible wheeze and  without cough on insp or exp maneuvers  and mild  Hyperresonant  to  percussion bilaterally     CV:  RRR  no s3 or murmur or increase in P2, and no edema   ABD:  soft and nontender   MS:  Nl gait/ ext warm without deformities Or obvious joint restrictions  calf tenderness, cyanosis or clubbing     SKIN: warm and dry without lesions    NEURO:  alert, approp, nl sensorium with  no motor or cerebellar deficits apparent.         Assessment & Plan:   Assessment & Plan COPD GOLD III Quit smoking 2014  PFT's 10/19/14   FEV1 1.24 (57 % ) ratio 53  p  9 % improvement from saba p ?  prior to study with DLCO  38 % corrects to 52 % for alv volume   - 11/29/2015    try bevespi   - 03/20/2019 admit with aecopd with Eos 1.5  - 04/01/2019    changed spiriva  to respimat/ continue symb 160 2bid - 04/01/2019 pred as plan d x 6 days only   - 05/03/19 changed symb to 80 due to thrush  - PFT's  12/14/2019  FEV1 0.96 (46 % ) ratio 0.49  p saba w/in one hour prior to study with DLCO  8.28 (39%) corrects to 2.16 (53%)  for alv volume and FV curve classic concave  - 03/20/2020  After extensive coaching inhaler device,  effectiveness =    75% smi > continue stiolto but use 1 puff each am instead of 2 (strongly doubt it's causing oral candidiasis)  -07/10/2020  refilled prednisone  x 6 days cycles only and added gerd rx  - 04/18/21  Added flutter valve  -  08/22/21 changed to trelegy  -  LDSCT  08/22/23    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   Chronic respiratory failure with hypoxia (HCC)  New start on outpt 02 03/20/2019 p d/c from admit  04/01/2019 Patient Saturations on Room Air at Rest = 96% >>> Room Air while Ambulating = 88% and on  2 Liters of pulsed oxygen while Ambulating = 93% - pt request change to cyclinders only 07/10/2020  > done - requesting d/c  02 07/15/2024  - .07/15/2024   Walked on RA   x  3  lap(s) =  approx 750  ft  @ nl  pace, stopped due to end of study  with lowest 02 sats 92% and no sob so  >>>  d/c all 02    Former cigarette smoker Quit smoking 2014   Low-dose CT lung cancer screening is recommended for patients who are 6-66 years of age with a 20+ pack-year history of smoking and who are currently smoking or quit <=15 years ago. No coughing up blood  No unintentional weight loss of > 15 pounds in the last 6 months - pt is eligible for scanning yearly until 2029 > continue LCS q February       AVS  Patient Instructions  No change in medications   We will walk you today to see if it's ok to completely stop your oxygen.   Please schedule a follow up visit in 12 months but call sooner if needed    Ozell America, MD 07/15/2024    "

## 2024-07-16 ENCOUNTER — Other Ambulatory Visit: Payer: Self-pay | Admitting: Internal Medicine

## 2024-07-16 DIAGNOSIS — J441 Chronic obstructive pulmonary disease with (acute) exacerbation: Secondary | ICD-10-CM

## 2024-08-23 ENCOUNTER — Other Ambulatory Visit

## 2024-09-07 ENCOUNTER — Encounter

## 2024-12-07 ENCOUNTER — Encounter

## 2025-01-03 ENCOUNTER — Ambulatory Visit: Payer: Self-pay | Admitting: Nurse Practitioner

## 2025-03-08 ENCOUNTER — Encounter

## 2025-06-07 ENCOUNTER — Encounter

## 2025-09-06 ENCOUNTER — Encounter
